# Patient Record
Sex: Male | Born: 1970 | Race: White | Hispanic: No | Marital: Married | Smoking: Former smoker
Health system: Southern US, Community
[De-identification: ages and names within clinical notes are randomized; demographics above are authoritative.]

## PROBLEM LIST (undated history)

## (undated) DIAGNOSIS — I10 Essential (primary) hypertension: Secondary | ICD-10-CM

## (undated) DIAGNOSIS — T8859XA Other complications of anesthesia, initial encounter: Secondary | ICD-10-CM

## (undated) DIAGNOSIS — K219 Gastro-esophageal reflux disease without esophagitis: Secondary | ICD-10-CM

## (undated) DIAGNOSIS — G4730 Sleep apnea, unspecified: Secondary | ICD-10-CM

## (undated) DIAGNOSIS — M1990 Unspecified osteoarthritis, unspecified site: Secondary | ICD-10-CM

## (undated) DIAGNOSIS — R51 Headache: Secondary | ICD-10-CM

## (undated) DIAGNOSIS — R112 Nausea with vomiting, unspecified: Secondary | ICD-10-CM

## (undated) DIAGNOSIS — R0689 Other abnormalities of breathing: Secondary | ICD-10-CM

## (undated) DIAGNOSIS — T4145XA Adverse effect of unspecified anesthetic, initial encounter: Secondary | ICD-10-CM

## (undated) DIAGNOSIS — M4316 Spondylolisthesis, lumbar region: Secondary | ICD-10-CM

## (undated) DIAGNOSIS — Z9889 Other specified postprocedural states: Secondary | ICD-10-CM

## (undated) HISTORY — PX: SPINE SURGERY: SHX786

## (undated) HISTORY — PX: VASECTOMY: SHX75

## (undated) HISTORY — PX: ANKLE FRACTURE SURGERY: SHX122

## (undated) HISTORY — PX: FRACTURE SURGERY: SHX138

## (undated) HISTORY — PX: CERVICAL FUSION: SHX112

## (undated) HISTORY — PX: KNEE LIGAMENT RECONSTRUCTION: SHX1895

## (undated) HISTORY — PX: BACK SURGERY: SHX140

---
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
## 2001-08-09 ENCOUNTER — Encounter: Payer: Self-pay | Admitting: Emergency Medicine

## 2001-08-09 ENCOUNTER — Emergency Department (HOSPITAL_COMMUNITY): Admission: EM | Admit: 2001-08-09 | Discharge: 2001-08-09 | Payer: Self-pay | Admitting: Emergency Medicine

## 2002-12-08 ENCOUNTER — Ambulatory Visit: Admission: RE | Admit: 2002-12-08 | Discharge: 2002-12-08 | Payer: Self-pay | Admitting: Family Medicine

## 2005-05-10 ENCOUNTER — Encounter: Admission: RE | Admit: 2005-05-10 | Discharge: 2005-05-10 | Payer: Self-pay | Admitting: Family Medicine

## 2005-06-06 ENCOUNTER — Ambulatory Visit (HOSPITAL_COMMUNITY): Admission: RE | Admit: 2005-06-06 | Discharge: 2005-06-06 | Payer: Self-pay | Admitting: Neurosurgery

## 2005-07-26 ENCOUNTER — Ambulatory Visit (HOSPITAL_COMMUNITY): Admission: RE | Admit: 2005-07-26 | Discharge: 2005-07-27 | Payer: Self-pay | Admitting: Neurosurgery

## 2007-08-02 ENCOUNTER — Encounter: Admission: RE | Admit: 2007-08-02 | Discharge: 2007-08-02 | Payer: Self-pay | Admitting: Family Medicine

## 2010-07-29 NOTE — Discharge Summary (Signed)
NAME:  Troy Franklin, Troy Franklin                ACCOUNT NO.:  [id number]   MEDICAL RECORD NO.:  [id number]          PATIENT TYPE:  OIB   LOCATION:  3007                         FACILITY:  [facility]   PHYSICIAN:  Coletta Memos, M.D.     DATE OF BIRTH:  1970-12-07   DATE OF ADMISSION:  07/26/2005  DATE OF DISCHARGE:  07/27/2005                                 DISCHARGE SUMMARY   ADMITTING DIAGNOSIS:  1.  Displaced disk C5-C6.  2.  C6 radiculopathy.   DISCHARGE DIAGNOSIS:  1.  Displaced disk C5-C6.  2.  C6 radiculopathy.   PROCEDURE:  Anterior cervical decompression C5-C6, arthrodesis C5-C6 with  PEEK interbody implant 7 mm Synthes ACS implant, anterior instrumentation  Synthes ACCS plate.   COMPLICATIONS:  None.   SURGEON:  Coletta Memos, M.D.   DISCHARGE STATUS:  Alive and well.   DISCHARGE DESTINATION:  Home.   MEDICATIONS:  Vicodin and Flexeril.   HOSPITAL COURSE:  Troy Franklin is a 40 year old who had pain in the upper  extremities which was unrelenting.  Time and conservative measures did not  help.  He therefore agreed to an ACDF.  He was admitted and taken to the  operating room for said procedure.  He had no complications.  Postoperatively, he has done quite well.  Speaking voice is strong.  Wound  is clean, dry, and without signs of infection at discharge.  He has been  able to void and walk, also tolerating a regular diet at discharge.  I will  see him back in the office in approximately 3-4 weeks.           ______________________________  Coletta Memos, M.D.     KC/MEDQ  D:  07/27/2005  T:  07/27/2005  Job:  [id number]

## 2010-07-29 NOTE — Op Note (Signed)
NAME:  Troy Franklin, Troy Franklin                ACCOUNT NO.:  [id number]   MEDICAL RECORD NO.:  [id number]            PATIENT TYPE:   LOCATION:                                 FACILITY:   PHYSICIAN:  Coletta Memos, M.D.          DATE OF BIRTH:   DATE OF PROCEDURE:  07/26/2005  DATE OF DISCHARGE:                                 OPERATIVE REPORT   PREOPERATIVE DIAGNOSIS:  1.  Displaced disk, right, C5-C6.  2.  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  3.  Right C6 radiculopathy.   POSTOPERATIVE DIAGNOSIS:  1.  Displaced disk, right, C5-C6.  2.  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  3.  Right C6 radiculopathy.   PROCEDURE:  1.  Anterior cervical decompression C5-6.  2.  Arthrodesis C5-6 with interbody 7-mm PEEK Synthes ACS graft, filled with      morselized allograft DBX bone putty.  3.  Anterior plating 14-mm ACCS Synthes plate.   COMPLICATIONS:  None.   SURGEON:  Coletta Memos, M.D.   ASSISTANT:  Danae Orleans. Venetia Maxon, M.D.   INDICATIONS:  Troy Franklin is a 40 year old who has had pain in his neck  and right upper extremity since January.  He has numbness and tingling in  the upper extremities.  He has pain in that side.  A myelogram showed a  displaced disk and foraminal narrowing.  I recommended, and he agreed to  undergo operative decompression.   OPERATIVE NOTE:  [name] was brought to the operating room, intubated and  placed under general anesthetic without difficulty.  He was positioned with  his head in neutral on a horseshoe headrest.  His neck was prepped.  He was  draped in a sterile fashion.  I infiltrated 5 mL of 1/2% lidocaine 1:200,000  strength epinephrine into the cervical region.  I started at the midline,  extended to the medial border of the left sternocleidomastoid at a crease  which was just at the cricothyroid membrane.  I opened the skin with a #15  blade; and I took this down sharply through the platysma.  I then dissected  rostrally and caudally in a plane above the platysma, using  Metzenbaum  scissors.  I opened the platysma in a horizontal fashion.  I dissected  inferior to the platysma rostrally and caudally.  I then created an  avascular plane going through the space between the sternocleidomastoid and  the medial strap muscles.  I placed a spinal needle into the cervical spine  and that was a 6-7.  I took another x-ray and placed a spinal needle one  disk space above, and that was at C5-6.  There was a large anterior  osteophyte at that level.  I then took down the osteophyte using a Leksell  rongeur.  I then reflected the longus colli muscles bilaterally at the C5-6  level.  I placed self-retaining retractor and then placed distraction pins.   I commenced with the decompression, first by opening the disk space and  removing the disk using curettes, pituitary rongeurs, and Kerrison punches.  I then used  a high-speed drill to drill away what were fairly large  osteophytes posteriorly coming off the body of the C6 and C5.  I did this  until I had good decompression bony wise.  I then opened the posterior  longitudinal ligament using a 1 Kerrison punch.  I proceeded with larger  punches in progressive fashion until I fully decompressed the spinal canal  and both C6 nerve roots.  I was more aggressive on the right sided, as that  is where he had the greatest amount of compression.  Dr. Venetia Maxon assisted with  the decompression.   I then prepared the endplates for arthrodesis, first straightening out the  vertebral bodies to accept the graft; and then making sure that all of the  disk material was gone.  I then placed a 7 mm PEEK ACS interbody graft over  DBX bon putty.  I removed the distraction,  and then the distraction pins.   I then placed a 14-mm plate, first by drilling; then using self-tapping  screws.  Four screws were placed, 2 at C5, 2 at C6.  This was done, again,  with the assistance of Dr. Venetia Maxon.  I then irrigated the wound copiously.  An  x-ray was  taken and it showed the plate and interbody to be in good  position.  I then closed the wound in a layered fashion using Vicryl sutures  to reapproximate the platysma and subcuticular layers.  Dermabond was used  for a sterile dressing.           ______________________________  Coletta Memos, M.D.     KC/MEDQ  D:  07/26/2005  T:  07/27/2005  Job:  [id number]

## 2011-03-14 HISTORY — PX: CERVICAL FUSION: SHX112

## 2011-06-26 ENCOUNTER — Other Ambulatory Visit: Payer: Self-pay | Admitting: Neurosurgery

## 2011-06-26 DIAGNOSIS — M542 Cervicalgia: Secondary | ICD-10-CM

## 2011-06-28 ENCOUNTER — Ambulatory Visit
Admission: RE | Admit: 2011-06-28 | Discharge: 2011-06-28 | Disposition: A | Discharge status: Home / Self Care | Payer: BC Managed Care – PPO | Source: Ambulatory Visit | Attending: Neurosurgery | Admitting: Neurosurgery

## 2011-06-28 DIAGNOSIS — M542 Cervicalgia: Secondary | ICD-10-CM

## 2011-08-18 ENCOUNTER — Other Ambulatory Visit: Payer: Self-pay | Admitting: Neurosurgery

## 2011-08-18 DIAGNOSIS — M545 Low back pain: Secondary | ICD-10-CM

## 2011-08-24 ENCOUNTER — Ambulatory Visit
Admission: RE | Admit: 2011-08-24 | Discharge: 2011-08-24 | Disposition: A | Discharge status: Home / Self Care | Payer: BC Managed Care – PPO | Source: Ambulatory Visit | Attending: Neurosurgery | Admitting: Neurosurgery

## 2011-08-24 DIAGNOSIS — M545 Low back pain: Secondary | ICD-10-CM

## 2011-08-25 ENCOUNTER — Other Ambulatory Visit: Payer: BC Managed Care – PPO

## 2011-08-28 ENCOUNTER — Other Ambulatory Visit: Payer: BC Managed Care – PPO

## 2011-09-05 ENCOUNTER — Encounter (HOSPITAL_COMMUNITY): Payer: Self-pay | Admitting: Pharmacy Technician

## 2011-09-05 ENCOUNTER — Other Ambulatory Visit: Payer: Self-pay | Admitting: Neurosurgery

## 2011-09-07 ENCOUNTER — Encounter (HOSPITAL_COMMUNITY): Payer: Self-pay

## 2011-09-07 ENCOUNTER — Other Ambulatory Visit (HOSPITAL_COMMUNITY): Payer: BC Managed Care – PPO

## 2011-09-07 ENCOUNTER — Encounter (HOSPITAL_COMMUNITY)
Admission: RE | Admit: 2011-09-07 | Discharge: 2011-09-07 | Disposition: A | Discharge status: Home / Self Care | Payer: BC Managed Care – PPO | Source: Ambulatory Visit | Attending: Neurosurgery | Admitting: Neurosurgery

## 2011-09-07 DIAGNOSIS — Z01812 Encounter for preprocedural laboratory examination: Secondary | ICD-10-CM | POA: Insufficient documentation

## 2011-09-07 HISTORY — DX: Other complications of anesthesia, initial encounter: T88.59XA

## 2011-09-07 HISTORY — DX: Headache: R51

## 2011-09-07 HISTORY — DX: Nausea with vomiting, unspecified: Z98.890

## 2011-09-07 HISTORY — DX: Adverse effect of unspecified anesthetic, initial encounter: T41.45XA

## 2011-09-07 HISTORY — DX: Nausea with vomiting, unspecified: R11.2

## 2011-09-07 HISTORY — DX: Unspecified osteoarthritis, unspecified site: M19.90

## 2011-09-07 LAB — CBC
HCT: 42 % (ref 39.0–52.0)
Hemoglobin: 14.8 g/dL (ref 13.0–17.0)
MCH: 31.2 pg (ref 26.0–34.0)
MCHC: 35.2 g/dL (ref 30.0–36.0)
MCV: 88.4 fL (ref 78.0–100.0)
Platelets: 150 10*3/uL (ref 150–400)
RBC: 4.75 MIL/uL (ref 4.22–5.81)
RDW: 12.3 % (ref 11.5–15.5)
WBC: 8.7 10*3/uL (ref 4.0–10.5)

## 2011-09-07 LAB — TYPE AND SCREEN
ABO/RH(D): O POS
Antibody Screen: NEGATIVE

## 2011-09-07 LAB — SURGICAL PCR SCREEN
MRSA, PCR: NEGATIVE
Staphylococcus aureus: NEGATIVE

## 2011-09-07 LAB — ABO/RH: ABO/RH(D): O POS

## 2011-09-07 NOTE — Progress Notes (Signed)
Does not have primary physician or cardiologist. Has not had cardiac work-up

## 2011-09-07 NOTE — Pre-Procedure Instructions (Signed)
20 Troy Franklin  09/07/2011   Your procedure is scheduled on:  July 3rd  Report to [facility] at 0600 AM.  Call this number if you have problems the morning of surgery: [phone]   Remember:   Do not eat food or drink:After Midnight.  Take these medicines the morning of surgery with A SIP OF WATER: none   Do not wear jewelry.  Do not wear lotions, powders, or perfumes.   Do not shave 48 hours prior to surgery. Men may shave face and neck.  Do not bring valuables to the hospital.  Contacts, dentures or bridgework may not be worn into surgery.  Leave suitcase in the car. After surgery it may be brought to your room.  For patients admitted to the hospital, checkout time is 11:00 AM the day of discharge.   Patients discharged the day of surgery will not be allowed to drive home.  Special Instructions: CHG Shower Use Special Wash: 1/2 bottle night before surgery and 1/2 bottle morning of surgery.   Please read over the following fact sheets that you were given: Pain Booklet, Coughing and Deep Breathing, Blood Transfusion Information, MRSA Information and Surgical Site Infection Prevention

## 2011-09-13 ENCOUNTER — Ambulatory Visit (HOSPITAL_COMMUNITY): Admission: RE | Admit: 2011-09-13 | Payer: BC Managed Care – PPO | Source: Ambulatory Visit | Admitting: Neurosurgery

## 2011-09-13 ENCOUNTER — Encounter (HOSPITAL_COMMUNITY): Admission: RE | Payer: Self-pay | Source: Ambulatory Visit

## 2011-09-13 SURGERY — POSTERIOR LUMBAR FUSION 1 LEVEL
Anesthesia: General

## 2012-02-27 ENCOUNTER — Other Ambulatory Visit: Payer: Self-pay | Admitting: Neurosurgery

## 2012-02-28 ENCOUNTER — Encounter (HOSPITAL_COMMUNITY): Payer: Self-pay

## 2012-02-28 ENCOUNTER — Encounter (HOSPITAL_COMMUNITY): Payer: Self-pay | Admitting: Respiratory Therapy

## 2012-02-28 ENCOUNTER — Encounter (HOSPITAL_COMMUNITY)
Admission: RE | Admit: 2012-02-28 | Discharge: 2012-02-28 | Disposition: A | Discharge status: Home / Self Care | Payer: BC Managed Care – PPO | Source: Ambulatory Visit | Attending: Neurosurgery | Admitting: Neurosurgery

## 2012-02-28 DIAGNOSIS — M129 Arthropathy, unspecified: Secondary | ICD-10-CM | POA: Insufficient documentation

## 2012-02-28 DIAGNOSIS — Z01818 Encounter for other preprocedural examination: Secondary | ICD-10-CM | POA: Insufficient documentation

## 2012-02-28 DIAGNOSIS — R51 Headache: Secondary | ICD-10-CM | POA: Insufficient documentation

## 2012-02-28 HISTORY — DX: Other abnormalities of breathing: R06.89

## 2012-02-28 LAB — CBC
HCT: 43 % (ref 39.0–52.0)
Hemoglobin: 15.2 g/dL (ref 13.0–17.0)
MCH: 31.2 pg (ref 26.0–34.0)
MCHC: 35.3 g/dL (ref 30.0–36.0)
MCV: 88.3 fL (ref 78.0–100.0)
Platelets: 159 10*3/uL (ref 150–400)
RBC: 4.87 MIL/uL (ref 4.22–5.81)
RDW: 11.9 % (ref 11.5–15.5)
WBC: 9 10*3/uL (ref 4.0–10.5)

## 2012-02-28 LAB — BASIC METABOLIC PANEL
BUN: 16 mg/dL (ref 6–23)
CO2: 26 mEq/L (ref 19–32)
Calcium: 9.8 mg/dL (ref 8.4–10.5)
Chloride: 104 mEq/L (ref 96–112)
Creatinine, Ser: 1 mg/dL (ref 0.50–1.35)
GFR calc Af Amer: >90 mL/min (ref >90)
GFR calc non Af Amer: >90 mL/min (ref >90)
Glucose, Bld: 84 mg/dL (ref 70–99)
Potassium: 4 mEq/L (ref 3.5–5.1)
Sodium: 139 mEq/L (ref 135–145)

## 2012-02-28 LAB — SURGICAL PCR SCREEN
MRSA, PCR: NEGATIVE
Staphylococcus aureus: NEGATIVE

## 2012-02-28 LAB — TYPE AND SCREEN
ABO/RH(D): O POS
Antibody Screen: NEGATIVE

## 2012-02-28 NOTE — Progress Notes (Signed)
Pt. Reports that he had ekg in past 4 yrs- at [facility]- wnl.

## 2012-02-28 NOTE — Pre-Procedure Instructions (Signed)
20 BARNETT ELZEY  02/28/2012   Your procedure is scheduled on:  03/07/2012  Report to [facility] at  5:30 AM.  Call this number if you have problems the morning of surgery: [phone]   Remember:   Do not eat food or drink liquid:After Midnight.- WEDNESDAY     Take these medicines the morning of surgery with A SIP OF WATER: nothing   Do not wear jewelry  Do not wear lotions, powders, or perfumes. You may wear deodorant.  . Men may shave face and neck.  Do not bring valuables to the hospital.  Contacts, dentures or bridgework may not be worn into surgery.  Leave suitcase in the car. After surgery it may be brought to your room.  For patients admitted to the hospital, checkout time is 11:00 AM the day of discharge.   Patients discharged the day of surgery will not be allowed to drive home.  Name and phone number of your driver: With Son  Special Instructions: Shower using CHG 2 nights before surgery and the night before surgery.  If you shower the day of surgery use CHG.  Use special wash - you have one bottle of CHG for all showers.  You should use approximately 1/3 of the bottle for each shower.   Please read over the following fact sheets that you were given: Pain Booklet, Coughing and Deep Breathing, Blood Transfusion Information, MRSA Information and Surgical Site Infection Prevention

## 2012-03-07 ENCOUNTER — Ambulatory Visit (HOSPITAL_COMMUNITY): Admission: RE | Admit: 2012-03-07 | Payer: BC Managed Care – PPO | Source: Ambulatory Visit | Admitting: Neurosurgery

## 2012-03-07 ENCOUNTER — Encounter (HOSPITAL_COMMUNITY): Admission: RE | Payer: Self-pay | Source: Ambulatory Visit

## 2012-03-07 SURGERY — POSTERIOR LUMBAR FUSION 1 LEVEL
Anesthesia: General

## 2012-04-03 ENCOUNTER — Other Ambulatory Visit: Payer: Self-pay | Admitting: Neurosurgery

## 2012-04-03 ENCOUNTER — Encounter (HOSPITAL_COMMUNITY): Payer: Self-pay | Admitting: Pharmacy Technician

## 2012-04-09 ENCOUNTER — Encounter (HOSPITAL_COMMUNITY): Payer: Self-pay

## 2012-04-09 ENCOUNTER — Encounter (HOSPITAL_COMMUNITY)
Admission: RE | Admit: 2012-04-09 | Discharge: 2012-04-09 | Disposition: A | Discharge status: Home / Self Care | Payer: BC Managed Care – PPO | Source: Ambulatory Visit | Attending: Neurosurgery | Admitting: Neurosurgery

## 2012-04-09 LAB — BASIC METABOLIC PANEL
BUN: 12 mg/dL (ref 6–23)
CO2: 29 mEq/L (ref 19–32)
Calcium: 9.4 mg/dL (ref 8.4–10.5)
Chloride: 103 mEq/L (ref 96–112)
Creatinine, Ser: 0.94 mg/dL (ref 0.50–1.35)
GFR calc Af Amer: >90 mL/min (ref >90)
GFR calc non Af Amer: >90 mL/min (ref >90)
Glucose, Bld: 114 mg/dL — ABNORMAL HIGH (ref 70–99)
Potassium: 4.2 mEq/L (ref 3.5–5.1)
Sodium: 139 mEq/L (ref 135–145)

## 2012-04-09 LAB — CBC
HCT: 42.5 % (ref 39.0–52.0)
Hemoglobin: 15 g/dL (ref 13.0–17.0)
MCH: 31.6 pg (ref 26.0–34.0)
MCHC: 35.3 g/dL (ref 30.0–36.0)
MCV: 89.5 fL (ref 78.0–100.0)
Platelets: 141 10*3/uL — ABNORMAL LOW (ref 150–400)
RBC: 4.75 MIL/uL (ref 4.22–5.81)
RDW: 12.2 % (ref 11.5–15.5)
WBC: 8 10*3/uL (ref 4.0–10.5)

## 2012-04-09 LAB — SURGICAL PCR SCREEN
MRSA, PCR: NEGATIVE
Staphylococcus aureus: NEGATIVE

## 2012-04-09 LAB — TYPE AND SCREEN
ABO/RH(D): O POS
Antibody Screen: NEGATIVE

## 2012-04-09 NOTE — Pre-Procedure Instructions (Signed)
Troy Franklin  04/09/2012   Your procedure is scheduled on:  Friday, January 31  Report to [facility] at 0700 AM.  Call this number if you have problems the morning of surgery: [phone]   Remember:   Do not eat food or drink liquids after midnight.Thursday night   Take these medicines the morning of surgery with A SIP OF WATER: none   Do not wear jewelry, make-up or nail polish.  Do not wear lotions, powders, or perfumes. You may wear deodorant.  Do not shave 48 hours prior to surgery. Men may shave face and neck.  Do not bring valuables to the hospital.  Contacts, dentures or bridgework may not be worn into surgery.  Leave suitcase in the car. After surgery it may be brought to your room.  For patients admitted to the hospital, checkout time is 11:00 AM the day of  discharge.     Special Instructions: Shower using CHG 2 nights before surgery and the night before surgery.  If you shower the day of surgery use CHG.  Use special wash - you have one bottle of CHG for all showers.  You should use approximately 1/3 of the bottle for each shower.   Please read over the following fact sheets that you were given: Pain Booklet, Coughing and Deep Breathing, Blood Transfusion Information, MRSA Information and Surgical Site Infection Prevention

## 2012-04-11 MED ORDER — CEFAZOLIN SODIUM-DEXTROSE 2-3 GM-% IV SOLR
2.0000 g | INTRAVENOUS | Status: AC
  Administered 2012-04-12: 2 g via INTRAVENOUS
  Filled 2012-04-11: qty 50

## 2012-04-12 ENCOUNTER — Encounter (HOSPITAL_COMMUNITY)
Admission: RE | Disposition: A | Discharge status: Home / Self Care | Payer: Self-pay | Source: Ambulatory Visit | Attending: Neurosurgery

## 2012-04-12 ENCOUNTER — Inpatient Hospital Stay (HOSPITAL_COMMUNITY): Payer: BC Managed Care – PPO

## 2012-04-12 ENCOUNTER — Encounter (HOSPITAL_COMMUNITY): Payer: Self-pay | Admitting: Certified Registered"

## 2012-04-12 ENCOUNTER — Inpatient Hospital Stay (HOSPITAL_COMMUNITY)
Admission: RE | Admit: 2012-04-12 | Discharge: 2012-04-15 | DRG: 756 | Disposition: A | Discharge status: Home / Self Care | Payer: BC Managed Care – PPO | Source: Ambulatory Visit | Attending: Neurosurgery | Admitting: Neurosurgery

## 2012-04-12 ENCOUNTER — Encounter (HOSPITAL_COMMUNITY): Payer: Self-pay | Admitting: *Deleted

## 2012-04-12 ENCOUNTER — Inpatient Hospital Stay (HOSPITAL_COMMUNITY): Payer: BC Managed Care – PPO | Admitting: Certified Registered"

## 2012-04-12 DIAGNOSIS — K5900 Constipation, unspecified: Secondary | ICD-10-CM | POA: Diagnosis not present

## 2012-04-12 DIAGNOSIS — M4316 Spondylolisthesis, lumbar region: Secondary | ICD-10-CM | POA: Diagnosis present

## 2012-04-12 DIAGNOSIS — Q762 Congenital spondylolisthesis: Principal | ICD-10-CM

## 2012-04-12 DIAGNOSIS — F17200 Nicotine dependence, unspecified, uncomplicated: Secondary | ICD-10-CM | POA: Diagnosis present

## 2012-04-12 DIAGNOSIS — Z79899 Other long term (current) drug therapy: Secondary | ICD-10-CM

## 2012-04-12 DIAGNOSIS — Z01812 Encounter for preprocedural laboratory examination: Secondary | ICD-10-CM

## 2012-04-12 SURGERY — POSTERIOR LUMBAR FUSION 1 LEVEL
Anesthesia: General | Site: Spine Lumbar | Wound class: Clean

## 2012-04-12 MED ORDER — PHENYLEPHRINE HCL 10 MG/ML IJ SOLN
INTRAMUSCULAR | Status: DC | PRN
  Administered 2012-04-12: 40 ug via INTRAVENOUS
  Administered 2012-04-12 (×4): 80 ug via INTRAVENOUS
  Administered 2012-04-12: 40 ug via INTRAVENOUS

## 2012-04-12 MED ORDER — OXYCODONE HCL 5 MG PO TABS
5.0000 mg | ORAL_TABLET | ORAL | Status: DC | PRN
  Administered 2012-04-12 – 2012-04-15 (×10): 10 mg via ORAL
  Filled 2012-04-12 (×10): qty 2

## 2012-04-12 MED ORDER — CEFAZOLIN SODIUM-DEXTROSE 2-3 GM-% IV SOLR: 2.0000 g | INTRAVENOUS | Status: DC

## 2012-04-12 MED ORDER — PHENOL 1.4 % MT LIQD: 1.0000 | OROMUCOSAL | Status: DC | PRN

## 2012-04-12 MED ORDER — NEOSTIGMINE METHYLSULFATE 1 MG/ML IJ SOLN
INTRAMUSCULAR | Status: DC | PRN
  Administered 2012-04-12: 3 mg via INTRAVENOUS

## 2012-04-12 MED ORDER — PROPOFOL 10 MG/ML IV BOLUS
INTRAVENOUS | Status: DC | PRN
  Administered 2012-04-12: 50 mg via INTRAVENOUS
  Administered 2012-04-12: 170 mg via INTRAVENOUS

## 2012-04-12 MED ORDER — SODIUM CHLORIDE 0.9 % IJ SOLN: 3.0000 mL | INTRAMUSCULAR | Status: DC | PRN

## 2012-04-12 MED ORDER — ONDANSETRON HCL 4 MG/2ML IJ SOLN: 4.0000 mg | INTRAMUSCULAR | Status: DC | PRN

## 2012-04-12 MED ORDER — ACETAMINOPHEN 650 MG RE SUPP: 650.0000 mg | RECTAL | Status: DC | PRN

## 2012-04-12 MED ORDER — MORPHINE SULFATE 2 MG/ML IJ SOLN: 2.0000 mg | INTRAMUSCULAR | Status: DC | PRN

## 2012-04-12 MED ORDER — ACETAMINOPHEN 325 MG PO TABS: 650.0000 mg | ORAL_TABLET | ORAL | Status: DC | PRN

## 2012-04-12 MED ORDER — VECURONIUM BROMIDE 10 MG IV SOLR
INTRAVENOUS | Status: DC | PRN
  Administered 2012-04-12: 1 mg via INTRAVENOUS
  Administered 2012-04-12 (×4): 2 mg via INTRAVENOUS
  Administered 2012-04-12: 1 mg via INTRAVENOUS

## 2012-04-12 MED ORDER — ONDANSETRON HCL 4 MG/2ML IJ SOLN
INTRAMUSCULAR | Status: DC | PRN
  Administered 2012-04-12: 4 mg via INTRAVENOUS

## 2012-04-12 MED ORDER — DIAZEPAM 5 MG PO TABS
5.0000 mg | ORAL_TABLET | Freq: Four times a day (QID) | ORAL | Status: DC | PRN
  Administered 2012-04-12 – 2012-04-14 (×2): 5 mg via ORAL
  Filled 2012-04-12 (×2): qty 1

## 2012-04-12 MED ORDER — MIDAZOLAM HCL 5 MG/5ML IJ SOLN
INTRAMUSCULAR | Status: DC | PRN
  Administered 2012-04-12: 2 mg via INTRAVENOUS

## 2012-04-12 MED ORDER — ROCURONIUM BROMIDE 100 MG/10ML IV SOLN
INTRAVENOUS | Status: DC | PRN
  Administered 2012-04-12: 50 mg via INTRAVENOUS

## 2012-04-12 MED ORDER — ACETAMINOPHEN 10 MG/ML IV SOLN
1000.0000 mg | Freq: Four times a day (QID) | INTRAVENOUS | Status: AC
  Administered 2012-04-12 – 2012-04-13 (×4): 1000 mg via INTRAVENOUS
  Filled 2012-04-12 (×4): qty 100

## 2012-04-12 MED ORDER — LIDOCAINE HCL (CARDIAC) 20 MG/ML IV SOLN
INTRAVENOUS | Status: DC | PRN
  Administered 2012-04-12: 80 mg via INTRAVENOUS

## 2012-04-12 MED ORDER — FENTANYL CITRATE 0.05 MG/ML IJ SOLN
25.0000 ug | INTRAMUSCULAR | Status: DC | PRN
  Administered 2012-04-12: 50 ug via INTRAVENOUS

## 2012-04-12 MED ORDER — GLYCOPYRROLATE 0.2 MG/ML IJ SOLN
INTRAMUSCULAR | Status: DC | PRN
  Administered 2012-04-12: 0.4 mg via INTRAVENOUS

## 2012-04-12 MED ORDER — DEXAMETHASONE SODIUM PHOSPHATE 4 MG/ML IJ SOLN
INTRAMUSCULAR | Status: DC | PRN
  Administered 2012-04-12: 8 mg via INTRAVENOUS

## 2012-04-12 MED ORDER — SODIUM CHLORIDE 0.9 % IV SOLN: 250.0000 mL | INTRAVENOUS | Status: DC

## 2012-04-12 MED ORDER — SENNA 8.6 MG PO TABS
1.0000 | ORAL_TABLET | Freq: Two times a day (BID) | ORAL | Status: DC
  Administered 2012-04-12 – 2012-04-15 (×6): 8.6 mg via ORAL
  Filled 2012-04-12 (×8): qty 1

## 2012-04-12 MED ORDER — POTASSIUM CHLORIDE IN NACL 20-0.9 MEQ/L-% IV SOLN
INTRAVENOUS | Status: DC
  Administered 2012-04-12: 19:00:00 via INTRAVENOUS
  Filled 2012-04-12 (×7): qty 1000

## 2012-04-12 MED ORDER — MENTHOL 3 MG MT LOZG: 1.0000 | LOZENGE | OROMUCOSAL | Status: DC | PRN

## 2012-04-12 MED ORDER — ALBUMIN HUMAN 5 % IV SOLN
INTRAVENOUS | Status: DC | PRN
  Administered 2012-04-12: 12:00:00 via INTRAVENOUS

## 2012-04-12 MED ORDER — 0.9 % SODIUM CHLORIDE (POUR BTL) OPTIME
TOPICAL | Status: DC | PRN
  Administered 2012-04-12 (×2): 1000 mL

## 2012-04-12 MED ORDER — EPHEDRINE SULFATE 50 MG/ML IJ SOLN
INTRAMUSCULAR | Status: DC | PRN
  Administered 2012-04-12 (×2): 10 mg via INTRAVENOUS

## 2012-04-12 MED ORDER — FENTANYL CITRATE 0.05 MG/ML IJ SOLN
INTRAMUSCULAR | Status: DC | PRN
  Administered 2012-04-12 (×4): 50 ug via INTRAVENOUS
  Administered 2012-04-12: 100 ug via INTRAVENOUS

## 2012-04-12 MED ORDER — POLYETHYLENE GLYCOL 3350 17 G PO PACK
17.0000 g | PACK | Freq: Every day | ORAL | Status: DC | PRN
  Filled 2012-04-12: qty 1

## 2012-04-12 MED ORDER — LIDOCAINE-EPINEPHRINE 0.5 %-1:200000 IJ SOLN
INTRAMUSCULAR | Status: DC | PRN
  Administered 2012-04-12 (×2): 20 mL

## 2012-04-12 MED ORDER — LACTATED RINGERS IV SOLN
INTRAVENOUS | Status: DC | PRN
  Administered 2012-04-12 (×3): via INTRAVENOUS

## 2012-04-12 MED ORDER — SODIUM CHLORIDE 0.9 % IJ SOLN
3.0000 mL | Freq: Two times a day (BID) | INTRAMUSCULAR | Status: DC
  Administered 2012-04-12 – 2012-04-14 (×4): 3 mL via INTRAVENOUS

## 2012-04-12 MED ORDER — THROMBIN 20000 UNITS EX SOLR
CUTANEOUS | Status: DC | PRN
  Administered 2012-04-12: 12:00:00 via TOPICAL

## 2012-04-12 MED ORDER — FENTANYL CITRATE 0.05 MG/ML IJ SOLN
INTRAMUSCULAR | Status: AC
  Administered 2012-04-12: 50 ug via INTRAVENOUS
  Filled 2012-04-12: qty 2

## 2012-04-12 MED ORDER — ACETAMINOPHEN 325 MG PO TABS
650.0000 mg | ORAL_TABLET | ORAL | Status: DC | PRN
  Administered 2012-04-14: 650 mg via ORAL
  Filled 2012-04-12 (×2): qty 2

## 2012-04-12 SURGICAL SUPPLY — 70 items
5.5x35 Shanks ×4 IMPLANT
ADH SKN CLS APL DERMABOND .7 (GAUZE/BANDAGES/DRESSINGS)
APL SKNCLS STERI-STRIP NONHPOA (GAUZE/BANDAGES/DRESSINGS)
BENZOIN TINCTURE PRP APPL 2/3 (GAUZE/BANDAGES/DRESSINGS) IMPLANT
BLADE SURG ROTATE 9660 (MISCELLANEOUS) IMPLANT
BONE MATRIX OSTEOCEL PLUS 5CC (Bone Implant) ×2 IMPLANT
BUR MATCHSTICK NEURO 3.0 LAGG (BURR) ×2 IMPLANT
CAGE COROENT MP 11X23X9 (Cage) ×2 IMPLANT
CANISTER SUCTION 2500CC (MISCELLANEOUS) ×2 IMPLANT
CLOTH BEACON ORANGE TIMEOUT ST (SAFETY) ×2 IMPLANT
CONT SPEC 4OZ CLIKSEAL STRL BL (MISCELLANEOUS) ×2 IMPLANT
COVER BACK TABLE 24X17X13 BIG (DRAPES) IMPLANT
DECANTER SPIKE VIAL GLASS SM (MISCELLANEOUS) ×2 IMPLANT
DERMABOND ADVANCED (GAUZE/BANDAGES/DRESSINGS)
DERMABOND ADVANCED .7 DNX12 (GAUZE/BANDAGES/DRESSINGS) ×1 IMPLANT
DRAPE C-ARM 42X72 X-RAY (DRAPES) ×4 IMPLANT
DRAPE C-ARMOR (DRAPES) ×1 IMPLANT
DRAPE INCISE IOBAN 66X45 STRL (DRAPES) ×1 IMPLANT
DRAPE LAPAROTOMY 100X72X124 (DRAPES) ×2 IMPLANT
DRAPE POUCH INSTRU U-SHP 10X18 (DRAPES) ×2 IMPLANT
DRAPE SURG 17X23 STRL (DRAPES) ×2 IMPLANT
DRESSING TELFA 8X3 (GAUZE/BANDAGES/DRESSINGS) IMPLANT
DURAPREP 26ML APPLICATOR (WOUND CARE) ×2 IMPLANT
ELECT REM PT RETURN 9FT ADLT (ELECTROSURGICAL) ×2
ELECTRODE REM PT RTRN 9FT ADLT (ELECTROSURGICAL) ×1 IMPLANT
GAUZE SPONGE 4X4 16PLY XRAY LF (GAUZE/BANDAGES/DRESSINGS) IMPLANT
GLOVE BIOGEL PI IND STRL 7.5 (GLOVE) IMPLANT
GLOVE BIOGEL PI IND STRL 8.5 (GLOVE) IMPLANT
GLOVE BIOGEL PI INDICATOR 7.5 (GLOVE) ×2
GLOVE BIOGEL PI INDICATOR 8.5 (GLOVE) ×2
GLOVE ECLIPSE 6.5 STRL STRAW (GLOVE) ×4 IMPLANT
GLOVE ECLIPSE 7.5 STRL STRAW (GLOVE) ×3 IMPLANT
GLOVE ECLIPSE 8.5 STRL (GLOVE) ×1 IMPLANT
GLOVE EXAM NITRILE LRG STRL (GLOVE) IMPLANT
GLOVE EXAM NITRILE MD LF STRL (GLOVE) IMPLANT
GLOVE EXAM NITRILE XL STR (GLOVE) IMPLANT
GLOVE EXAM NITRILE XS STR PU (GLOVE) IMPLANT
GLOVE SURG SS PI 8.0 STRL IVOR (GLOVE) ×3 IMPLANT
GOWN BRE IMP SLV AUR LG STRL (GOWN DISPOSABLE) ×5 IMPLANT
GOWN BRE IMP SLV AUR XL STRL (GOWN DISPOSABLE) ×2 IMPLANT
GOWN STRL REIN 2XL LVL4 (GOWN DISPOSABLE) ×3 IMPLANT
KIT BASIN OR (CUSTOM PROCEDURE TRAY) ×2 IMPLANT
KIT POSITION SURG JACKSON T1 (MISCELLANEOUS) ×2 IMPLANT
KIT ROOM TURNOVER OR (KITS) ×2 IMPLANT
NDL HYPO 25X1 1.5 SAFETY (NEEDLE) ×1 IMPLANT
NDL SPNL 18GX3.5 QUINCKE PK (NEEDLE) IMPLANT
NEEDLE HYPO 25X1 1.5 SAFETY (NEEDLE) ×2 IMPLANT
NEEDLE SPNL 18GX3.5 QUINCKE PK (NEEDLE) ×2 IMPLANT
NS IRRIG 1000ML POUR BTL (IV SOLUTION) ×3 IMPLANT
PACK LAMINECTOMY NEURO (CUSTOM PROCEDURE TRAY) ×2 IMPLANT
PAD ARMBOARD 7.5X6 YLW CONV (MISCELLANEOUS) ×3 IMPLANT
ROD 5.5X40MM (Rod) ×2 IMPLANT
SCREW LOCK (Screw) ×8 IMPLANT
SCREW LOCK FXNS SPNE MAS PL (Screw) IMPLANT
SCREW TULIP 5.5 (Screw) ×4 IMPLANT
SLEEVE SURGEON STRL (DRAPES) ×1 IMPLANT
SPONGE GAUZE 4X4 12PLY (GAUZE/BANDAGES/DRESSINGS) IMPLANT
SPONGE LAP 4X18 X RAY DECT (DISPOSABLE) IMPLANT
SPONGE SURGIFOAM ABS GEL 100 (HEMOSTASIS) ×2 IMPLANT
STRIP CLOSURE SKIN 1/2X4 (GAUZE/BANDAGES/DRESSINGS) IMPLANT
SUT PROLENE 6 0 BV (SUTURE) IMPLANT
SUT VIC AB 0 CT1 18XCR BRD8 (SUTURE) ×1 IMPLANT
SUT VIC AB 0 CT1 8-18 (SUTURE) ×4
SUT VIC AB 2-0 CT1 18 (SUTURE) ×2 IMPLANT
SUT VIC AB 3-0 SH 8-18 (SUTURE) ×3 IMPLANT
SYR 20ML ECCENTRIC (SYRINGE) ×2 IMPLANT
TOWEL OR 17X24 6PK STRL BLUE (TOWEL DISPOSABLE) ×2 IMPLANT
TOWEL OR 17X26 10 PK STRL BLUE (TOWEL DISPOSABLE) ×2 IMPLANT
TRAY FOLEY CATH 14FRSI W/METER (CATHETERS) ×2 IMPLANT
WATER STERILE IRR 1000ML POUR (IV SOLUTION) ×2 IMPLANT

## 2012-04-12 NOTE — Preoperative (Signed)
Beta Blockers   Reason not to administer Beta Blockers:Not Applicable 

## 2012-04-12 NOTE — Anesthesia Preprocedure Evaluation (Addendum)
Anesthesia Evaluation  Patient identified by MRN, date of birth, ID band Patient awake    Reviewed: Allergy & Precautions, H&P , NPO status , Patient's Chart, lab work & pertinent test results  History of Anesthesia Complications (+) PONV  Airway Mallampati: II      Dental   Pulmonary neg pulmonary ROS,  breath sounds clear to auscultation        Cardiovascular negative cardio ROS  Rhythm:Regular Rate:Normal     Neuro/Psych  Headaches,    GI/Hepatic negative GI ROS, Neg liver ROS,   Endo/Other  negative endocrine ROS  Renal/GU negative Renal ROS     Musculoskeletal   Abdominal   Peds  Hematology   Anesthesia Other Findings   Reproductive/Obstetrics                           Anesthesia Physical Anesthesia Plan  ASA: II  Anesthesia Plan: General   Post-op Pain Management:    Induction: Intravenous  Airway Management Planned: Oral ETT  Additional Equipment:   Intra-op Plan:   Post-operative Plan: Extubation in OR  Informed Consent: I have reviewed the patients History and Physical, chart, labs and discussed the procedure including the risks, benefits and alternatives for the proposed anesthesia with the patient or authorized representative who has indicated his/her understanding and acceptance.   Dental advisory given  Plan Discussed with: CRNA, Anesthesiologist and Surgeon  Anesthesia Plan Comments:         Anesthesia Quick Evaluation

## 2012-04-12 NOTE — Transfer of Care (Signed)
Immediate Anesthesia Transfer of Care Note  Patient: Troy Franklin  Procedure(s) Performed: Procedure(s) (LRB) with comments: POSTERIOR LUMBAR FUSION 1 LEVEL (N/A) - Lumbar four-five posterior lumbar interbody fusion with interbody prothesis posterolateral arthrodesis and posterior nonsegmental instrumentation  Patient Location: PACU  Anesthesia Type:General  Level of Consciousness: awake, alert , oriented and patient cooperative  Airway & Oxygen Therapy: Patient Spontanous Breathing and Patient connected to face mask oxygen  Post-op Assessment: Report given to PACU RN, Post -op Vital signs reviewed and stable and Patient moving all extremities  Post vital signs: Reviewed and stable  Complications: No apparent anesthesia complications

## 2012-04-12 NOTE — Progress Notes (Signed)
Patient ID: Troy Franklin, male   DOB: 16-Nov-1970, 42 y.o.   MRN: [id number] BP 125/74  Pulse 82  Temp 97.8 F (36.6 C) (Oral)  Resp 16  SpO2 99% Alert and oriented x 4 Moving all extremities well Wound is clean and dry Doing well post op

## 2012-04-12 NOTE — Op Note (Signed)
04/12/2012  4:46 PM  PATIENT:  Troy Franklin  42 y.o. 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 Woolbright was taken to the operating room intubated and placed under a general anesthetic without difficulty. He had a foley catheter placed under sterile conditions. He was positioned prone on a Jackson table with all pressure points properly padded. His back was prepped and drape in sterile fashion. I infiltrated 20 cc into the planned incision and paraspinous region. I opened the skin with a 10 blade. I used monopolar cautery to expose the lamina of L3, L4 And L5. I exposed the pars of L4 bilaterally and L5 bilaterally.  Now having exposed my landmarks I proceeded with screw placement using fluoroscopic guidance. The screws were placed in a medial to lateral direction using a cortical based entry inferior to the facet and medial to the pars at each level and side. The pedicles were were drilled, tapped, and sounded to ensure their integrity. I placed 5.5 mm screws all of them were 30mm in length. Fluoroscopy showed all the screws to be in good position. I then decompressed the spinal canal and the L4, L5 roots bilaterally. I performed a complete laminectomyn of L4 to decompress the canal. I  peformed inferior facetectomies of L4 bilaterally to make sure the L4 roots were decompressed. The facets were significantly arthropathic and a great deal of bone removal was performed to decompress the l4 and l5 roots. I also performed discetomies bilaterally to prepare for the PLIF. I used curettes, disc space shavers, and Kerrison punches. After the discetomy I measured the space and felt 11mm 4 degree cages were appropiate. I placed 2 cages packed with morselized allograft and autograft into the L4/5 disc space. Fluoroscopy showed the screws and cages in good position. I placed more bone into the disc space.  I connected the screw heads to the screws, then the rods. I secured the rods with locking caps on both sides. I irrigated copiously.  I closed the wound in layered fashion approximating the thoracolumbar fascia, subcutaneous, and subcuticular layers with vicryl sutures. I used dermabond for a sterile dressing.   PLAN OF CARE: Admit to inpatient   PATIENT DISPOSITION:  PACU - hemodynamically stable.   Delay start of Pharmacological VTE agent (>24hrs) due to surgical blood loss or risk of bleeding:  yes

## 2012-04-12 NOTE — H&P (Signed)
BP 119/81  Pulse 78  Temp 98.2 F (36.8 C) (Oral)  Resp 18  SpO2 97% Troy Franklin is a patient of mine whom I took to the operating room in 2007 for a herniated disc at C5-6.  He returns today and is age 42 with complaints of pain in his low back, which has been present for approximately a year.  He feels it mainly in his buttocks. He says if he moves a certain way he will have a sharp pain again in the buttocks, and maybe it will radiate to the knees. What is more impressive for him and more troublesome has been neck pain and in the right upper extremity.  He says 50% of the time it feels numb and tingly.  No discomfort whatsoever in the left upper extremity. He will find himself putting his right arm behind his head.  He has been doing this for six to eight months, but for the last two months it has become excruciating.  He finds driving and shaving quite tough to do.  He takes an occasional Advil 800 mg. b.i.d. for that pain.    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  He does smoke. He does drink alcohol socially. He does have a history of substance abuse.  He is 5'8" tall and weighs 190 lbs.    PAST MEDICAL HISTORY:  Otherwise good. He has had three operations, cervical fusion, knee operation and a broken leg.  He has an ALLERGY TO CODEINE.  He takes Advil only.    FAMILY HISTORY:    His mother 3 is in poor health and has diabetes and breast cancer.  His father 37 is in good health and has hypertension.    REVIEW OF SYSTEMS:   He reports leg pain with walking, arm weakness, leg weakness, back pain, arm pain, leg pain, neck pain, and food allergies.  He denies constitutional, eye, ear, nose, throat, mouth, respiratory, gastrointestinal, genitourinary, skin, neurological, psychiatric, endocrine, or hematologic problems.    PHYSICAL EXAMINATION:  On examination, he is alert, oriented x 4, and answering all questions appropriately.  Memory, language, attention span,  and fund of knowledge are normal. He is well kempt and in no distress.  5/5 strength in the upper extremities.  2+ reflexes in the biceps, triceps, brachioradialis, knees, and ankles.  Downgoing toes to plantar stimulation.  Proprioception intact in the upper and lower extremities.  No clonus.  No Hoffmann's sign.  Normal muscle tone, bulk, and coordination. Gait is normal.  Speech is clear and fluent.  RADIOLOGY He returned with an MRI of the lumbar spine.  What it shows is a significant amount of facet arthropathy at L4-5, a degenerative slip, foraminal narrowing, and compromise of both L4 roots, and generalized lumbar stenosis. The rest of his spine actually looks good. The cauda and the conus are normal.  Paraspinous soft tissues are normal.  The L4-5 level is the reason for his discomfort.  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 When he stands he has pain and when he sits he does not.Troy Franklin has been in physical therapy for his lumbar region. It has not resulted in any improvement in his status.  He still has a great deal of pain.  The resultant decision is that I still believe he needs to undergo a lumbar fusion at L4-5 for the listhesis and stenosis.   he has now had complete and full conservative treatment, anti-inflammatories, steroids,  and physical therapy without improvement.  Troy Franklin has decided to undergo a Lumbar fusion and decompression at L4/5 to relieve his neurogenic claudication. Risks including but not limited to include bleeding, infection, need for further surgery, weakness, bowel/bladder dysfunction, no improvement, worse pain, fusion failure, hardware failure, and others were discussed

## 2012-04-12 NOTE — Anesthesia Postprocedure Evaluation (Signed)
  Anesthesia Post-op Note  Patient: Troy Franklin  Procedure(s) Performed: Procedure(s) (LRB) with comments: POSTERIOR LUMBAR FUSION 1 LEVEL (N/A) - Lumbar four-five posterior lumbar interbody fusion with interbody prothesis posterolateral arthrodesis and posterior nonsegmental instrumentation  Patient Location: PACU  Anesthesia Type:General  Level of Consciousness: awake  Airway and Oxygen Therapy: Patient Spontanous Breathing  Post-op Pain: mild  Post-op Assessment: Post-op Vital signs reviewed  Post-op Vital Signs: Reviewed  Complications: No apparent anesthesia complications

## 2012-04-12 NOTE — Anesthesia Procedure Notes (Signed)
Procedure Name: Intubation Date/Time: 04/12/2012 11:10 AM Performed by: Jerilee Hoh Pre-anesthesia Checklist: Patient identified, Suction available, Patient being monitored and Emergency Drugs available Patient Re-evaluated:Patient Re-evaluated prior to inductionOxygen Delivery Method: Circle system utilized Preoxygenation: Pre-oxygenation with 100% oxygen Intubation Type: IV induction Ventilation: Mask ventilation without difficulty and Oral airway inserted - appropriate to patient size Grade View: Grade III Tube type: Oral Tube size: 7.5 mm Number of attempts: 2 Airway Equipment and Method: Video-laryngoscopy and Rigid stylet Placement Confirmation: ETT inserted through vocal cords under direct vision,  positive ETCO2 and breath sounds checked- equal and bilateral Secured at: 22 cm Tube secured with: Tape Dental Injury: Teeth and Oropharynx as per pre-operative assessment  Comments: Easy mask airway.  DL with MAC 4 blade, Unable to view cords.  Glidescope used, Grade 1 with Glidescope.  Atraumatic intubation.  VSS throughout intervention.

## 2012-04-13 ENCOUNTER — Encounter (HOSPITAL_COMMUNITY): Payer: Self-pay | Admitting: *Deleted

## 2012-04-13 DIAGNOSIS — M4316 Spondylolisthesis, lumbar region: Secondary | ICD-10-CM | POA: Diagnosis present

## 2012-04-13 NOTE — Plan of Care (Signed)
Problem: Consults Goal: Diagnosis - Spinal Surgery Outcome: Completed/Met Date Met:  04/13/12 Thoraco/Lumbar Spine Fusion

## 2012-04-13 NOTE — Progress Notes (Signed)
Orthopedic Tech Progress Note Patient Details:  BRADLEE HEITMAN 1970/07/07 [id number]  Patient ID: Rande Brunt Kemmerer, male   DOB: [birth year], 42 y.o.   MRN: [id number] Brace order completed by Storm Frisk, Charlisha Market 04/13/2012, 3:32 PM

## 2012-04-13 NOTE — Evaluation (Signed)
Occupational Therapy Evaluation Patient Details Name: Troy Franklin MRN: [id number] DOB: [birth year] Today's Date: 04/13/2012 Time: 4098-1191 OT Time Calculation (min): 25 min  OT Assessment / Plan / Recommendation Clinical Impression  Pleasant 42 yr old male admitted for lumbar fusion.  Presents at a supervision to modified independent level for simulated selfcare tasks and functional transfers with use of the RW and back brace.  Pt will have assist at home from his girlfriend as well.  No further acute care OT needs pt has all needed DME.    OT Assessment  Patient does not need any further OT services    Follow Up Recommendations  No OT follow up       Equipment Recommendations  None recommended by OT          Precautions / Restrictions Precautions Precautions: Back Precaution Booklet Issued: Yes (comment) Precaution Comments: educated pt,. in "BAT" back precautions and log rolling technique Required Braces or Orthoses:  (no order for brace in chart, no brace in pt. room) Restrictions Weight Bearing Restrictions: No   Pertinent Vitals/Pain No significant pain reported during therapy, pt reports increased pain at incision site if he lays on his back    ADL  Eating/Feeding: Simulated;Independent Where Assessed - Eating/Feeding: Wheelchair Grooming: Simulated;Modified independent Where Assessed - Grooming: Supported standing Upper Body Bathing: Simulated;Set up Where Assessed - Upper Body Bathing: Unsupported sitting Lower Body Bathing: Simulated;Supervision/safety Where Assessed - Lower Body Bathing: Supported sit to stand Upper Body Dressing: Simulated;Set up Where Assessed - Upper Body Dressing: Unsupported sitting Lower Body Dressing: Simulated;Supervision/safety Where Assessed - Lower Body Dressing: Supported sit to stand;Other (comment) (with use of reacher and sockaide) Toilet Transfer: Performed;Modified independent Toilet Transfer Method: Other (comment) (ambulate  with RW) Toilet Transfer Equipment: Comfort height toilet Toileting - Clothing Manipulation and Hygiene: Simulated;Modified independent Where Assessed - Toileting Clothing Manipulation and Hygiene: Sit to stand from 3-in-1 or toilet Tub/Shower Transfer: Simulated;Modified independent Tub/Shower Transfer Method: Ambulating;Other (comment) (step over edge of simulated walk-in shower) Tub/Shower Transfer Equipment: Walk in shower Equipment Used: Rolling walker;Back brace;Reacher;Sock aid Transfers/Ambulation Related to ADLs: Pt overall supervision level to modified independent for mobility. ADL Comments: Pt able to state 3/3 back precautions.  Educated pt and girlfriend on AE use for LB selfcare as well as toileting.  Discussed specific techniques for toilet hygiene, LB bathing and dressing,  bed mobility and functional transfers.  No further acute or post acute OT needs.       Visit Information  Last OT Received On: 04/13/12 Assistance Needed: +1    Subjective Data  Subjective: I think I will be able to get up off of the toilet without any problem. Patient Stated Goal: Did not state but agreeable to participating in OT session.   Prior Functioning     Home Living Lives With: Son;Other (Comment) (57 year old son to assist upon DC) Available Help at Discharge: Available PRN/intermittently Type of Home: House Home Access: Stairs to enter Entrance Stairs-Number of Steps: 3 Entrance Stairs-Rails: Right;Left Home Layout: One level Bathroom Shower/Tub: Walk-in shower;Door Teacher, early years/pre: Yes How Accessible: Accessible via walker Home Adaptive Equipment: None Prior Function Level of Independence: Independent Able to Take Stairs?: Yes Driving: Yes Vocation: Full time employment Communication Communication: No difficulties Dominant Hand: Right         Vision/Perception Vision - Assessment Eye Alignment: Within Functional Limits Vision  Assessment: Vision not tested Perception Perception: Within Functional Limits Praxis Praxis: Intact   Cognition  Overall Cognitive Status: Appears within functional limits for tasks assessed/performed Arousal/Alertness: Awake/alert Orientation Level: Appears intact for tasks assessed Behavior During Session: [facility] for tasks performed    Extremity/Trunk Assessment Right Upper Extremity Assessment RUE ROM/Strength/Tone: [facility] for tasks assessed;Unable to fully assess;Due to precautions RUE Sensation: WFL - Light Touch RUE Coordination: WFL - gross/fine motor Left Upper Extremity Assessment LUE ROM/Strength/Tone: Within functional levels;WFL for tasks assessed;Unable to fully assess;Due to precautions LUE Sensation: WFL - Light Touch LUE Coordination: WFL - gross/fine motor Trunk Assessment Trunk Assessment: Normal     Mobility Bed Mobility Bed Mobility: Sit to Supine Sit to Supine: 5: Supervision;HOB flat Details for Bed Mobility Assistance: min instructional cueing for correct technique for sit to supine. Transfers Transfers: Sit to Stand Sit to Stand: 5: Supervision;With upper extremity assist Stand to Sit: 5: Supervision;With upper extremity assist           Balance Balance Balance Assessed: Yes Dynamic Standing Balance Dynamic Standing - Balance Support: Right upper extremity supported;Left upper extremity supported Dynamic Standing - Level of Assistance: 5: Stand by assistance   End of Session OT - End of Session Equipment Utilized During Treatment: Back brace Activity Tolerance: Patient tolerated treatment well Patient left: in bed;with call bell/phone within reach;with family/visitor present Nurse Communication: Mobility status     Birdell Frasier OTR/L Pager number [phone] 04/13/2012, 3:26 PM

## 2012-04-13 NOTE — Progress Notes (Signed)
Upon admission to the unit assessment around 2017 Pt back incision has minimal serosanguinous drainage. Site opened to air with derma bond intact. Dr. Alben Deeds notified of drainage. Pt sheet changed. This am pt continue to have minimal drainage with no odor. Pt sheet changed and dry dsg applied to site. Incoming RN notified and will continue to monitor.

## 2012-04-13 NOTE — Progress Notes (Signed)
Orthopedic Tech Progress Note Patient Details:  Troy Franklin [birth year] [id number]  Patient ID: Troy Franklin, male   DOB: [birth year], 42 y.o.   MRN: [id number] Called in bio-tech brace order @1340 ; spoke with Maudie Mercury, Willona Phariss 04/13/2012, 1:39 PM

## 2012-04-13 NOTE — Progress Notes (Signed)
Subjective: Patient reports Feels reasonably comfortable.  Objective: Vital signs in last 24 hours: Temp:  [97.4 F (36.3 C)-98.1 F (36.7 C)] 98.1 F (36.7 C) (02/01 1005) Pulse Rate:  [71-94] 84  (02/01 1005) Resp:  [14-20] 18  (02/01 1005) BP: (109-143)/(62-82) 126/82 mmHg (02/01 1005) SpO2:  [95 %-100 %] 100 % (02/01 1005)  Intake/Output from previous day: 01/31 0701 - 02/01 0700 In: 3114 [I.V.:2864; IV Piggyback:250] Out: 3975 [Urine:3625; Blood:350] Intake/Output this shift:    Some bleedthrough on dressing. Dressing on back changed. Motor function appears intact.  Lab Results: No results found for this basename: WBC:2,HGB:2,HCT:2,PLT:2 in the last 72 hours BMET No results found for this basename: NA:2,K:2,CL:2,CO2:2,GLUCOSE:2,BUN:2,CREATININE:2,CALCIUM:2 in the last 72 hours  Studies/Results: Dg Lumbar Spine 2-3 Views  04/12/2012  *RADIOLOGY REPORT*  Clinical Data: Low back pain.  DG C-ARM 1-60 MIN,LUMBAR SPINE - 2-3 VIEW  Comparison: MRI 08/24/2011  Findings: Intraoperative C-arm films document bilateral L4 and L5 screws and interbody cages in preparation for L4-5 fusion.  IMPRESSION: As above.   Original Report Authenticated By: Davonna Belling, M.D.    Dg C-arm 1-60 Min  04/12/2012  *RADIOLOGY REPORT*  Clinical Data: Low back pain.  DG C-ARM 1-60 MIN,LUMBAR SPINE - 2-3 VIEW  Comparison: MRI 08/24/2011  Findings: Intraoperative C-arm films document bilateral L4 and L5 screws and interbody cages in preparation for L4-5 fusion.  IMPRESSION: As above.   Original Report Authenticated By: Davonna Belling, M.D.     Assessment/Plan: Stable postop day 1  LOS: 1 day  Mobilize apply lumbar corset when patient out of bed. Change dressing when necessary   Troy Franklin J 04/13/2012, 12:41 PM

## 2012-04-13 NOTE — Evaluation (Signed)
Physical Therapy Evaluation Patient Details Name: Troy Franklin MRN: [id number] DOB: November 22, 1970 Today's Date: 04/13/2012 Time: 0865-7846 PT Time Calculation (min): 45 min  PT Assessment / Plan / Recommendation Clinical Impression  Pt. was admitted for pain in low back , spondylolisthesis, lumbar stenosis with radicular symptoms at L 4-5.  He underwent PLIF at same level and now presents with decreased independence in functional mobility,transfers,  gait .  He will benefit from PT to address his mobility issues and provide back education for DC home.    PT Assessment  Patient needs continued PT services    Follow Up Recommendations  No PT follow up;Supervision - Intermittent    Does the patient have the potential to tolerate intense rehabilitation      Barriers to Discharge None      Equipment Recommendations  Rolling walker with 5" wheels    Recommendations for Other Services OT consult   Frequency Min 6X/week    Precautions / Restrictions Precautions Precautions: Back Precaution Booklet Issued: Yes (comment) Precaution Comments: educated pt,. in "BAT" back precautions and log rolling technique Required Braces or Orthoses:  (no order for brace in chart, no brace in pt. room) Restrictions Weight Bearing Restrictions: No   Pertinent Vitals/Pain Pain in back 2/10 throughout session.  PT. Positioned for comfort in recliner after walking.      Mobility  Bed Mobility Bed Mobility: Rolling Left;Left Sidelying to Sit;Sitting - Scoot to [place] of Bed Rolling Left: 4: Min assist;With rail Left Sidelying to Sit: 3: Mod assist;HOB flat Sitting - Scoot to Edge of Bed: 6: Modified independent (Device/Increase time) Details for Bed Mobility Assistance: pt. needed cues for technique and back precautions, mod assist at shoulders  to rise to sit from sidelying Transfers Transfers: Sit to Stand;Stand to Sit Sit to Stand: 4: Min assist;From bed;With upper extremity assist Stand to Sit: 4:  Min assist;To chair/3-in-1;With armrests Details for Transfer Assistance: cues for hand placement and technique Ambulation/Gait Ambulation/Gait Assistance: 4: Min guard Ambulation Distance (Feet): 120 Feet Assistive device: Rolling walker Ambulation/Gait Assistance Details: cues for turning technique in order to maintain back precautions, otherwse pt. managing well Gait Pattern: Step-through pattern;Decreased step length - right;Decreased step length - left Gait velocity: slowed Stairs: No    Shoulder Instructions     Exercises     PT Diagnosis: Difficulty walking;Acute pain  PT Problem List: Decreased activity tolerance;Decreased mobility;Decreased knowledge of use of DME;Decreased knowledge of precautions;Pain PT Treatment Interventions: DME instruction;Gait training;Stair training;Functional mobility training;Therapeutic activities;Patient/family education   PT Goals Acute Rehab PT Goals PT Goal Formulation: With patient Time For Goal Achievement: 04/20/12 Potential to Achieve Goals: Good Pt will Roll Supine to Right Side: with modified independence PT Goal: Rolling Supine to Right Side - Progress: Goal set today Pt will Roll Supine to Left Side: with modified independence PT Goal: Rolling Supine to Left Side - Progress: Goal set today Pt will go Supine/Side to Sit: with modified independence PT Goal: Supine/Side to Sit - Progress: Goal set today Pt will go Sit to Supine/Side: with modified independence PT Goal: Sit to Supine/Side - Progress: Goal set today Pt will go Sit to Stand: with modified independence PT Goal: Sit to Stand - Progress: Goal set today Pt will go Stand to Sit: with modified independence PT Goal: Stand to Sit - Progress: Goal set today Pt will Transfer Bed to Chair/Chair to Bed: with modified independence PT Transfer Goal: Bed to Chair/Chair to Bed - Progress: Goal set today Pt will  Ambulate: >150 feet;with modified independence;with least restrictive  assistive device PT Goal: Ambulate - Progress: Goal set today Pt will Go Up / Down Stairs: 3-5 stairs;with rail(s);with min assist PT Goal: Up/Down Stairs - Progress: Goal set today  Visit Information  Last PT Received On: 04/13/12 Assistance Needed: +1    Subjective Data  Subjective: "I had neck surgery in May of last year" Patient Stated Goal: return to working from home; hunting, fishing, boating   Prior Functioning  Home Living Lives With: Aeronautical engineer (Comment) (23 year old son to assist upon DC) Available Help at Discharge: Available PRN/intermittently Type of Home: House Home Access: Stairs to enter [employer] of Steps: 3 Entrance Stairs-Rails: Right;Left Home Layout: One level Bathroom Shower/Tub: Walk-in shower;Door [employer] Toilet: Pharmacist, community: Yes How Accessible: Accessible via walker Home Adaptive Equipment: None Prior Function Level of Independence: Independent Able to Take Stairs?: Yes Driving: Yes Vocation: Full time employment Communication Communication: No difficulties Dominant Hand: Right    Cognition  Overall Cognitive Status: Appears within functional limits for tasks assessed/performed Arousal/Alertness: Awake/alert Orientation Level: Oriented X4 / Intact Behavior During Session: [facility] for tasks performed    Extremity/Trunk Assessment Right Upper Extremity Assessment RUE ROM/Strength/Tone: [employer] for tasks assessed Left Upper Extremity Assessment LUE ROM/Strength/Tone: WFL for tasks assessed Right Lower Extremity Assessment RLE ROM/Strength/Tone: [facility] for tasks assessed Left Lower Extremity Assessment LLE ROM/Strength/Tone: WFL for tasks assessed Trunk Assessment Trunk Assessment: Normal   Balance    End of Session PT - End of Session Equipment Utilized During Treatment: Gait belt Activity Tolerance: Patient tolerated treatment well Patient left: in chair;with call bell/phone within reach;with nursing in room Nurse  Communication: Mobility status  GP     Ferman Hamming 04/13/2012, 10:23 AM Weldon Picking PT Acute Rehab Services [phone] Beeper [phone]

## 2012-04-14 MED ORDER — OXYCODONE HCL 5 MG PO TABS: 5.0000 mg | ORAL_TABLET | ORAL | Status: DC | PRN

## 2012-04-14 MED ORDER — DIAZEPAM 5 MG PO TABS: 5.0000 mg | ORAL_TABLET | Freq: Four times a day (QID) | ORAL | Status: DC | PRN

## 2012-04-14 MED ORDER — MAGNESIUM HYDROXIDE 400 MG/5ML PO SUSP: 30.0000 mL | Freq: Every day | ORAL | Status: DC | PRN

## 2012-04-14 NOTE — Progress Notes (Signed)
Subjective: Patient reports Constipation but otherwise legs feel good  Objective: Vital signs in last 24 hours: Temp:  [98 F (36.7 C)-98.9 F (37.2 C)] 98.2 F (36.8 C) (02/02 0611) Pulse Rate:  [73-109] 105  (02/02 0611) Resp:  [18] 18  (02/02 0611) BP: (126-140)/(60-82) 140/69 mmHg (02/02 0611) SpO2:  [95 %-100 %] 95 % (02/02 0611) Weight:  [86.183 kg (190 lb)] 86.183 kg (190 lb) (02/01 1818)  Intake/Output from previous day: 02/01 0701 - 02/02 0700 In: -  Out: 1450 [Urine:1450] Intake/Output this shift:    Dressing clean and dry motor function intact  Lab Results: No results found for this basename: WBC:2,HGB:2,HCT:2,PLT:2 in the last 72 hours BMET No results found for this basename: NA:2,K:2,CL:2,CO2:2,GLUCOSE:2,BUN:2,CREATININE:2,CALCIUM:2 in the last 72 hours  Studies/Results: Dg Lumbar Spine 2-3 Views  04/12/2012  *RADIOLOGY REPORT*  Clinical Data: Low back pain.  DG C-ARM 1-60 MIN,LUMBAR SPINE - 2-3 VIEW  Comparison: MRI 08/24/2011  Findings: Intraoperative C-arm films document bilateral L4 and L5 screws and interbody cages in preparation for L4-5 fusion.  IMPRESSION: As above.   Original Report Authenticated By: Davonna Belling, M.D.    Dg C-arm 1-60 Min  04/12/2012  *RADIOLOGY REPORT*  Clinical Data: Low back pain.  DG C-ARM 1-60 MIN,LUMBAR SPINE - 2-3 VIEW  Comparison: MRI 08/24/2011  Findings: Intraoperative C-arm films document bilateral L4 and L5 screws and interbody cages in preparation for L4-5 fusion.  IMPRESSION: As above.   Original Report Authenticated By: Davonna Belling, M.D.     Assessment/Plan: Stable postop day 2  LOS: 2 days  we'll add laxatives for bowel movement .   Cheryl Chay J 04/14/2012, 8:31 AM

## 2012-04-14 NOTE — Progress Notes (Signed)
Patient has low grade temp. And not feeling well. Reports incision is very sore. Will continue to monitor patient and pain. Will call physician for temperature over 101.  Troy Franklin

## 2012-04-14 NOTE — Progress Notes (Signed)
Physical Therapy Treatment Patient Details Name: Troy Franklin MRN: [id number] DOB: Nov 20, 1970 Today's Date: 04/14/2012 Time: 7829-5621 PT Time Calculation (min): 29 min  PT Assessment / Plan / Recommendation Comments on Treatment Session  Pt making good progress with mobility & PT goals at this date.   Able to perform stairs & increase ambulation distance.      Follow Up Recommendations  No PT follow up;Supervision - Intermittent     Does the patient have the potential to tolerate intense rehabilitation     Barriers to Discharge        Equipment Recommendations  Rolling walker with 5" wheels    Recommendations for Other Services    Frequency Min 6X/week   Plan Discharge plan remains appropriate    Precautions / Restrictions Precautions Precautions: Back Precaution Comments: Pt able to recall all 3 back precautions & did well maintaining with functional activity Required Braces or Orthoses: Spinal Brace Spinal Brace: Applied in sitting position Restrictions Weight Bearing Restrictions: No       Mobility  Bed Mobility Bed Mobility: Rolling Left;Left Sidelying to Sit;Sitting - Scoot to [place] of Bed;Sit to Sidelying Left Rolling Left: 5: Supervision Left Sidelying to Sit: 5: Supervision;HOB flat Sitting - Scoot to Edge of Bed: 6: Modified independent (Device/Increase time) Sit to Supine: 5: Supervision;HOB flat Sit to Sidelying Left: 4: Min assist;HOB flat Details for Bed Mobility Assistance: Cues for sequencing & technique.  (A) to align shoulders/hips/LE's properly once he was supine in bed.   Transfers Transfers: Sit to Stand;Stand to Sit Sit to Stand: 5: Supervision;With upper extremity assist;From bed;From chair/3-in-1 Stand to Sit: 5: Supervision;With upper extremity assist;To bed;To chair/3-in-1 Details for Transfer Assistance: Cues for hand placement & technique.   Ambulation/Gait Ambulation/Gait Assistance: 5: Supervision Assistive device: Rolling  walker Ambulation/Gait Assistance Details: Pt with stiff/guarded posture but is steady.  Cues to attempt increased gait speed for more functional speed.   Gait Pattern: Step-through pattern;Decreased stride length;Antalgic (Rt LE discomfort with increased step length per pt) Gait velocity: decreased General Gait Details: Pt c/o discomfort in Rt LE/posterior thigh as he swings through with it.   Stairs: Yes Stairs Assistance: 4: Min guard Stair Management Technique: One rail Right;Sideways;Step to pattern Number of Stairs: 4  Wheelchair Mobility Wheelchair Mobility: No     PT Goals Acute Rehab PT Goals Time For Goal Achievement: 04/20/12 Potential to Achieve Goals: Good Pt will Roll Supine to Right Side: with modified independence Pt will Roll Supine to Left Side: with modified independence PT Goal: Rolling Supine to Left Side - Progress: Progressing toward goal Pt will go Supine/Side to Sit: with modified independence PT Goal: Supine/Side to Sit - Progress: Progressing toward goal Pt will go Sit to Supine/Side: with modified independence PT Goal: Sit to Supine/Side - Progress: Progressing toward goal Pt will go Sit to Stand: with modified independence PT Goal: Sit to Stand - Progress: Progressing toward goal Pt will go Stand to Sit: with modified independence PT Goal: Stand to Sit - Progress: Progressing toward goal Pt will Transfer Bed to Chair/Chair to Bed: with modified independence Pt will Ambulate: >150 feet;with modified independence;with least restrictive assistive device PT Goal: Ambulate - Progress: Progressing toward goal Pt will Go Up / Down Stairs: 3-5 stairs;with rail(s);with min assist PT Goal: Up/Down Stairs - Progress: Progressing toward goal  Visit Information  Last PT Received On: 04/14/12 Assistance Needed: +1    Subjective Data      Cognition  Overall Cognitive Status: Appears within  functional limits for tasks assessed/performed Arousal/Alertness:  Awake/alert Orientation Level: Appears intact for tasks assessed Behavior During Session: [facility] for tasks performed    Balance     End of Session PT - End of Session Equipment Utilized During Treatment: Gait belt;Back brace Activity Tolerance: Patient tolerated treatment well Patient left: in bed;with call bell/phone within reach;with family/visitor present Nurse Communication: Mobility status    Verdell Face, [place] [phone] 04/14/2012

## 2012-04-14 NOTE — Progress Notes (Signed)
Patient requested to be discharged tomorrow,but after an order was obtained he requested again to leave early tomorrow morning since his ride just went home, all assessments remain unchanged at this time. Will pas  It on the in-coming nurse,though discharges papers and prescription have  been given.

## 2012-04-14 NOTE — Discharge Summary (Signed)
Physician Discharge Summary  Patient ID: Troy Franklin MRN: [id number] DOB/AGE: Jun 22, 1970 42 y.o.  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 rate 18, height 5\' 7"  (1.702 m), weight 86.183 kg (190 lb), SpO2 97.00%. Neurologic: Alert and oriented X 3, normal strength and tone. Normal symmetric reflexes. Normal coordination and gait Wound:CDI  Disposition: Home     Medication List     As of 04/14/2012  5:07 PM    TAKE these medications         aspirin-acetaminophen-caffeine 250-250-65 MG per tablet   Commonly known as: EXCEDRIN MIGRAINE   Take 1 tablet by mouth every 6 (six) hours as needed.      diazepam 5 MG tablet   Commonly known as: VALIUM   Take 1 tablet (5 mg total) by mouth every 6 (six) hours as needed (Muscle spasm).      ibuprofen 200 MG tablet   Commonly known as: ADVIL,MOTRIN   Take 800 mg by mouth every 6 (six) hours as needed. For pain      oxyCODONE 5 MG immediate release tablet   Commonly known as: Oxy IR/ROXICODONE   Take 1-2 tablets (5-10 mg total) by mouth every 4 (four) hours as needed for pain.         Signed: Dorian Heckle, MD 04/14/2012, 5:07 PM

## 2012-04-15 NOTE — Progress Notes (Signed)
Patient discharge at 1215 with all belongings and rolling walker . Patient given discharge instructions and reviewed by RN . Patient verbalized understanding of discharge instructions . Patient able to verbalize signs and symptoms of infection. Back incision with minimal swelling . Pink in color dermabond intact no drainage.   Cleotilde Neer

## 2012-04-15 NOTE — Progress Notes (Signed)
PT Cancellation Note  Patient Details Name: Troy Franklin MRN: [id number] DOB: Apr 15, 1970   Cancelled Treatment:     Pt d/cing home.     Verdell Face, [place] [phone] 04/15/2012

## 2012-04-15 NOTE — Progress Notes (Signed)
NCM spoke to pt and states he has family and friends that can assist him at home. He is requesting a RW. [facility] DME rep notified for DME for home. No other NCM needs identified. Isidoro Donning RN CCM Case Mgmt phone [phone]

## 2013-02-11 ENCOUNTER — Emergency Department (HOSPITAL_COMMUNITY)
Admission: EM | Admit: 2013-02-11 | Discharge: 2013-02-11 | Disposition: A | Discharge status: Home / Self Care | Payer: BC Managed Care – PPO | Attending: Emergency Medicine | Admitting: Emergency Medicine

## 2013-02-11 ENCOUNTER — Encounter (HOSPITAL_COMMUNITY): Payer: Self-pay | Admitting: Emergency Medicine

## 2013-02-11 DIAGNOSIS — X58XXXA Exposure to other specified factors, initial encounter: Secondary | ICD-10-CM | POA: Insufficient documentation

## 2013-02-11 DIAGNOSIS — Z9889 Other specified postprocedural states: Secondary | ICD-10-CM | POA: Insufficient documentation

## 2013-02-11 DIAGNOSIS — M549 Dorsalgia, unspecified: Secondary | ICD-10-CM

## 2013-02-11 DIAGNOSIS — Y939 Activity, unspecified: Secondary | ICD-10-CM | POA: Insufficient documentation

## 2013-02-11 DIAGNOSIS — S39012A Strain of muscle, fascia and tendon of lower back, initial encounter: Secondary | ICD-10-CM

## 2013-02-11 DIAGNOSIS — M129 Arthropathy, unspecified: Secondary | ICD-10-CM | POA: Insufficient documentation

## 2013-02-11 DIAGNOSIS — Y929 Unspecified place or not applicable: Secondary | ICD-10-CM | POA: Insufficient documentation

## 2013-02-11 DIAGNOSIS — F17200 Nicotine dependence, unspecified, uncomplicated: Secondary | ICD-10-CM | POA: Insufficient documentation

## 2013-02-11 DIAGNOSIS — S335XXA Sprain of ligaments of lumbar spine, initial encounter: Secondary | ICD-10-CM | POA: Insufficient documentation

## 2013-02-11 MED ORDER — PREDNISONE 50 MG PO TABS
60.0000 mg | ORAL_TABLET | Freq: Once | ORAL | Status: AC
  Administered 2013-02-11: 60 mg via ORAL
  Filled 2013-02-11 (×2): qty 1

## 2013-02-11 MED ORDER — PREDNISONE 20 MG PO TABS: ORAL_TABLET | ORAL | Status: DC

## 2013-02-11 MED ORDER — DIAZEPAM 5 MG PO TABS: 5.0000 mg | ORAL_TABLET | Freq: Two times a day (BID) | ORAL | Status: DC

## 2013-02-11 NOTE — ED Provider Notes (Signed)
CSN: [id number]     Arrival date & time 02/11/13  1507 History   First MD Initiated Contact with Patient 02/11/13 1521     Chief Complaint  Patient presents with  . Back Pain   (Consider location/radiation/quality/duration/timing/severity/associated sxs/prior Treatment) HPI Comments: Patient is a 42 year old male with a past medical history of low back surgery in January 2014 who presents to the emergency department complaining of gradual onset right-sided low back pain x5 days. No known injury or trauma. Pain described as sharp and constant, non-radiating, worse when going from a laying to a seated position rated 9/10. States this does not feel the same as his prior muscle spasms. He tried taking oxycodone and flexeril left over from his surgery with minimal relief. Denies fever, chills, nausea, vomiting, difficulty urinating, increased urinary frequency, urgency, hematuria or dysuria.  The history is provided by the patient.    Past Medical History  Diagnosis Date  . Complication of anesthesia     difficulty waking up- sleepiness lasted longer than other times.   [name] PONV (postoperative nausea and vomiting)   . Loud breathing during sleep     snoring, states the sleep study was WNL, done betwn 1995-2005  . 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 Arthritis     lumbar   Past Surgical History  Procedure Laterality Date  . Knee ligament reconstruction      right   . Vasectomy    . Ankle fracture surgery      right - pins and screws  . Cervical fusion  2013  . Cervical fusion      2007 & 2013  . Hernia repair  1974    hydrocele  . Back surgery     History reviewed. No pertinent family history. History  Substance Use Topics  . Smoking status: Current Every Day Smoker -- 0.25 packs/day for 4 years    Types: Cigarettes  . Smokeless tobacco: Never Used  . Alcohol Use: Yes     Comment: socially- x1/month    Review of Systems  Musculoskeletal: Positive for back pain.    All other systems reviewed and are negative.    Allergies  Codeine and Onion  Home Medications   Current Outpatient Rx  Name  Route  Sig  Dispense  Refill  . aspirin-acetaminophen-caffeine (EXCEDRIN MIGRAINE) 250-250-65 MG per tablet   Oral   Take 1 tablet by mouth every 6 (six) hours as needed.         . cyclobenzaprine (FLEXERIL) 10 MG tablet   Oral   Take 10 mg by mouth 3 (three) times daily as needed for muscle spasms.         [name] ibuprofen (ADVIL,MOTRIN) 200 MG tablet   Oral   Take 800 mg by mouth every 6 (six) hours as needed. For pain         . oxyCODONE (OXY IR/ROXICODONE) 5 MG immediate release tablet   Oral   Take 1-2 tablets (5-10 mg total) by mouth every 4 (four) hours as needed for pain.   60 tablet   0   . diazepam (VALIUM) 5 MG tablet   Oral   Take 1 tablet (5 mg total) by mouth every 6 (six) hours as needed (Muscle spasm).   30 tablet   0   . diazepam (VALIUM) 5 MG tablet   Oral   Take 1 tablet (5 mg total) by mouth 2 (two) times daily.   10 tablet  0   . predniSONE (DELTASONE) 20 MG tablet      2 tabs po daily x 3 days   6 tablet   0    BP 157/91  Pulse 82  Temp(Src) 97.7 F (36.5 C) (Oral)  Resp 14  Ht 5\' 8"  (1.727 m)  Wt 190 lb (86.183 kg)  BMI 28.90 kg/m2  SpO2 98% Physical Exam  Nursing note and vitals reviewed. Constitutional: He is oriented to person, place, and time. He appears well-developed and well-nourished. No distress.  HENT:  Head: Normocephalic and atraumatic.  Mouth/Throat: Oropharynx is clear and moist.  Eyes: Conjunctivae are normal.  Neck: Normal range of motion. Neck supple.  Cardiovascular: Normal rate, regular rhythm and normal heart sounds.   Pulmonary/Chest: Effort normal and breath sounds normal.  Abdominal: Soft. Bowel sounds are normal. There is no tenderness. There is no CVA tenderness.  Musculoskeletal: Normal range of motion. He exhibits no edema.       Lumbar back: He exhibits no bony  tenderness and no edema.       Back:  TTP of right paralumbar muscles. Normal gait. Pain exacerbated by hip flexion.  Neurological: He is alert and oriented to person, place, and time.  Strength 5/5 lower extremities and equal bilateral. Sensation intact.  Skin: Skin is warm and dry. He is not diaphoretic.  Psychiatric: He has a normal mood and affect. His behavior is normal.    ED Course  Procedures (including critical care time) Labs Review Labs Reviewed - No data to display Imaging Review No results found.  EKG Interpretation   None       MDM   1. Back pain   2. Lumbar strain, initial encounter    No red flags concerning patient's back pain. No s/s of central cord compression or cauda equina. Lower extremities are neurovascularly intact and patient is ambulating without difficulty. He will f/u with Dr. Mikal Plane. Short course of steroids given, valium. Return precautions given. Patient states understanding of treatment care plan and is agreeable.    Trevor Mace, PA-C 02/11/13 1547

## 2013-02-11 NOTE — ED Provider Notes (Signed)
Medical screening examination/treatment/procedure(s) were performed by non-physician practitioner and as supervising physician I was immediately available for consultation/collaboration.  EKG Interpretation   None      Medical screening examination/treatment/procedure(s) were performed by non-physician practitioner and as supervising physician I was immediately available for consultation/collaboration.  EKG Interpretation   None        Donnetta Hutching, MD 02/11/13 1909

## 2013-02-11 NOTE — ED Notes (Signed)
Pt c/o R flank/back pain, onset Thursday. No known injury. Pt denies hx of kidney stones, has had back sx.

## 2013-12-18 ENCOUNTER — Other Ambulatory Visit (HOSPITAL_COMMUNITY): Payer: Self-pay | Admitting: Respiratory Therapy

## 2014-01-14 ENCOUNTER — Ambulatory Visit: Payer: Self-pay | Admitting: Family Medicine

## 2014-02-03 ENCOUNTER — Ambulatory Visit: Payer: Self-pay | Admitting: Family Medicine

## 2017-09-05 ENCOUNTER — Other Ambulatory Visit: Payer: Self-pay | Admitting: Neurosurgery

## 2017-09-05 DIAGNOSIS — M5441 Lumbago with sciatica, right side: Secondary | ICD-10-CM

## 2017-09-19 ENCOUNTER — Ambulatory Visit
Admission: RE | Admit: 2017-09-19 | Discharge: 2017-09-19 | Disposition: A | Discharge status: Home / Self Care | Payer: BLUE CROSS/BLUE SHIELD | Source: Ambulatory Visit | Attending: Neurosurgery | Admitting: Neurosurgery

## 2017-09-19 DIAGNOSIS — M48061 Spinal stenosis, lumbar region without neurogenic claudication: Secondary | ICD-10-CM | POA: Insufficient documentation

## 2017-09-19 DIAGNOSIS — M5441 Lumbago with sciatica, right side: Secondary | ICD-10-CM | POA: Diagnosis not present

## 2017-09-19 DIAGNOSIS — M47816 Spondylosis without myelopathy or radiculopathy, lumbar region: Secondary | ICD-10-CM | POA: Insufficient documentation

## 2017-09-19 DIAGNOSIS — M5136 Other intervertebral disc degeneration, lumbar region: Secondary | ICD-10-CM | POA: Insufficient documentation

## 2017-09-19 DIAGNOSIS — Z981 Arthrodesis status: Secondary | ICD-10-CM | POA: Insufficient documentation

## 2017-09-19 MED ORDER — GADOBENATE DIMEGLUMINE 529 MG/ML IV SOLN
18.0000 mL | Freq: Once | INTRAVENOUS | Status: AC | PRN
Start: 2017-09-19 — End: 2017-09-19
  Administered 2017-09-19: 18 mL via INTRAVENOUS

## 2017-10-03 ENCOUNTER — Other Ambulatory Visit: Payer: Self-pay | Admitting: Neurosurgery

## 2017-10-04 NOTE — Pre-Procedure Instructions (Signed)
Troy Franklin  10/04/2017      [facility] [place], [place] - [address] [place] [address] Phone: [phone] Fax: [phone]    Your procedure is scheduled on Wed., October 10, 2017 from 2:04PM-6:30PM  Report to [facility] Admitting Entrance "A" at 12:00 Noon  Call this number if you have problems the morning of surgery:  [phone]   Remember:  Do not eat or drink after midnight on July 30th    Take these medicines the morning of surgery with A SIP OF WATER: Omeprazole (PRILOSEC)  As of today, stop taking all Other Aspirin Products, Vitamins, Fish oils, and Herbal medications. Also stop all NSAIDS i.e. Advil, Ibuprofen, Motrin, Aleve, Anaprox, Naproxen, BC, Goody Powders, and all Supplements. Including: Meloxicam (MOBIC)     Do not wear jewelry.  Do not wear lotions, powders, colognes, or deodorant.  Do not shave 48 hours prior to surgery.  Men may shave face.  Do not bring valuables to the hospital.  [facility] is not responsible for any belongings or valuables.  Contacts, dentures or bridgework may not be worn into surgery.  Leave your suitcase in the car.  After surgery it may be brought to your room.  For patients admitted to the hospital, discharge time will be determined by your treatment team.  Patients discharged the day of surgery will not be allowed to drive home.   Special instructions:  [facility]- Preparing For Surgery  Before surgery, you can play an important role. Because skin is not sterile, your skin needs to be as free of germs as possible. You can reduce the number of germs on your skin by washing with CHG (chlorahexidine gluconate) Soap before surgery.  CHG is an antiseptic cleaner which kills germs and bonds with the skin to continue killing germs even after washing.    Oral Hygiene is also important to reduce your risk of infection.  Remember - BRUSH YOUR TEETH THE MORNING OF SURGERY WITH YOUR  REGULAR TOOTHPASTE  Please do not use if you have an allergy to CHG or antibacterial soaps. If your skin becomes reddened/irritated stop using the CHG.  Do not shave (including legs and underarms) for at least 48 hours prior to first CHG shower. It is OK to shave your face.  Please follow these instructions carefully.   1. Shower the NIGHT BEFORE SURGERY and the MORNING OF SURGERY with CHG.   2. If you chose to wash your hair, wash your hair first as usual with your normal shampoo.  3. After you shampoo, rinse your hair and body thoroughly to remove the shampoo.  4. Use CHG as you would any other liquid soap. You can apply CHG directly to the skin and wash gently with a scrungie or a clean washcloth.   5. Apply the CHG Soap to your body ONLY FROM THE NECK DOWN.  Do not use on open wounds or open sores. Avoid contact with your eyes, ears, mouth and genitals (private parts). Wash Face and genitals (private parts)  with your normal soap.  6. Wash thoroughly, paying special attention to the area where your surgery will be performed.  7. Thoroughly rinse your body with warm water from the neck down.  8. DO NOT shower/wash with your normal soap after using and rinsing off the CHG Soap.  9. Pat yourself dry with a CLEAN TOWEL.  10. Wear CLEAN PAJAMAS to bed the night before surgery, wear  comfortable clothes the morning of surgery  11. Place CLEAN SHEETS on your bed the night of your first shower and DO NOT SLEEP WITH PETS.  Day of Surgery:  Do not apply any deodorants/lotions.  Please wear clean clothes to the hospital/surgery center.   Remember to brush your teeth WITH YOUR REGULAR TOOTHPASTE.  Please read over the following fact sheets that you were given. Pain Booklet, Coughing and Deep Breathing, MRSA Information and Surgical Site Infection Prevention

## 2017-10-05 ENCOUNTER — Other Ambulatory Visit: Payer: Self-pay

## 2017-10-05 ENCOUNTER — Encounter (HOSPITAL_COMMUNITY)
Admission: RE | Admit: 2017-10-05 | Discharge: 2017-10-05 | Disposition: A | Discharge status: Home / Self Care | Payer: BLUE CROSS/BLUE SHIELD | Source: Ambulatory Visit | Attending: Neurosurgery | Admitting: Neurosurgery

## 2017-10-05 ENCOUNTER — Encounter (HOSPITAL_COMMUNITY): Payer: Self-pay

## 2017-10-05 DIAGNOSIS — Z01812 Encounter for preprocedural laboratory examination: Secondary | ICD-10-CM | POA: Diagnosis not present

## 2017-10-05 DIAGNOSIS — Z01810 Encounter for preprocedural cardiovascular examination: Secondary | ICD-10-CM | POA: Insufficient documentation

## 2017-10-05 DIAGNOSIS — I498 Other specified cardiac arrhythmias: Secondary | ICD-10-CM | POA: Insufficient documentation

## 2017-10-05 HISTORY — DX: Sleep apnea, unspecified: G47.30

## 2017-10-05 HISTORY — DX: Essential (primary) hypertension: I10

## 2017-10-05 HISTORY — DX: Gastro-esophageal reflux disease without esophagitis: K21.9

## 2017-10-05 HISTORY — DX: Spondylolisthesis, lumbar region: M43.16

## 2017-10-05 LAB — CBC
HCT: 44.6 % (ref 39.0–52.0)
Hemoglobin: 15 g/dL (ref 13.0–17.0)
MCH: 30.9 pg (ref 26.0–34.0)
MCHC: 33.6 g/dL (ref 30.0–36.0)
MCV: 92 fL (ref 78.0–100.0)
Platelets: 141 10*3/uL — ABNORMAL LOW (ref 150–400)
RBC: 4.85 MIL/uL (ref 4.22–5.81)
RDW: 12 % (ref 11.5–15.5)
WBC: 7 10*3/uL (ref 4.0–10.5)

## 2017-10-05 LAB — BASIC METABOLIC PANEL
Anion gap: 9 (ref 5–15)
BUN: 13 mg/dL (ref 6–20)
CO2: 25 mmol/L (ref 22–32)
Calcium: 9.3 mg/dL (ref 8.9–10.3)
Chloride: 106 mmol/L (ref 98–111)
Creatinine, Ser: 1.06 mg/dL (ref 0.61–1.24)
GFR calc Af Amer: >60 mL/min (ref >60)
GFR calc non Af Amer: >60 mL/min (ref >60)
Glucose, Bld: 100 mg/dL — ABNORMAL HIGH (ref 70–99)
Potassium: 4.1 mmol/L (ref 3.5–5.1)
Sodium: 140 mmol/L (ref 135–145)

## 2017-10-05 LAB — TYPE AND SCREEN
ABO/RH(D): O POS
Antibody Screen: NEGATIVE

## 2017-10-05 LAB — SURGICAL PCR SCREEN
MRSA, PCR: NEGATIVE
Staphylococcus aureus: NEGATIVE

## 2017-10-05 NOTE — Progress Notes (Signed)
[facility]  Cardiologist - Denies  Chest x-ray - Denies  EKG - 10/05/17  Stress Test - Denies  ECHO - Denies  Cardiac Cath - Denies  Sleep Study - Yes- Positive CPAP - Yes- Told to bring mask/hose dos  LABS- 10/05/17: CBC, BMP, T/S  ASA- Denies   Anesthesia- No  Pt denies having chest pain, sob, or fever at this time. All instructions explained to the pt, with a verbal understanding of the material. Pt agrees to go over the instructions while at home for a better understanding. The opportunity to ask questions was provided.

## 2017-10-09 ENCOUNTER — Other Ambulatory Visit: Payer: Self-pay | Admitting: Neurosurgery

## 2017-10-10 ENCOUNTER — Inpatient Hospital Stay (HOSPITAL_COMMUNITY): Payer: BLUE CROSS/BLUE SHIELD | Admitting: Anesthesiology

## 2017-10-10 ENCOUNTER — Inpatient Hospital Stay (HOSPITAL_COMMUNITY)
Admission: RE | Admit: 2017-10-10 | Discharge: 2017-10-13 | DRG: 460 | Disposition: A | Discharge status: Home / Self Care | Payer: BLUE CROSS/BLUE SHIELD | Source: Ambulatory Visit | Attending: Neurosurgery | Admitting: Neurosurgery

## 2017-10-10 ENCOUNTER — Encounter (HOSPITAL_COMMUNITY)
Admission: RE | Disposition: A | Discharge status: Home / Self Care | Payer: Self-pay | Source: Ambulatory Visit | Attending: Neurosurgery

## 2017-10-10 ENCOUNTER — Inpatient Hospital Stay (HOSPITAL_COMMUNITY): Payer: BLUE CROSS/BLUE SHIELD

## 2017-10-10 ENCOUNTER — Encounter (HOSPITAL_COMMUNITY): Payer: Self-pay | Admitting: Anesthesiology

## 2017-10-10 DIAGNOSIS — M4316 Spondylolisthesis, lumbar region: Secondary | ICD-10-CM | POA: Diagnosis present

## 2017-10-10 DIAGNOSIS — G4730 Sleep apnea, unspecified: Secondary | ICD-10-CM | POA: Diagnosis present

## 2017-10-10 DIAGNOSIS — I10 Essential (primary) hypertension: Secondary | ICD-10-CM | POA: Diagnosis present

## 2017-10-10 DIAGNOSIS — Z419 Encounter for procedure for purposes other than remedying health state, unspecified: Secondary | ICD-10-CM

## 2017-10-10 DIAGNOSIS — Z6834 Body mass index (BMI) 34.0-34.9, adult: Secondary | ICD-10-CM

## 2017-10-10 DIAGNOSIS — Z91018 Allergy to other foods: Secondary | ICD-10-CM

## 2017-10-10 DIAGNOSIS — F17200 Nicotine dependence, unspecified, uncomplicated: Secondary | ICD-10-CM | POA: Diagnosis present

## 2017-10-10 DIAGNOSIS — Z885 Allergy status to narcotic agent status: Secondary | ICD-10-CM

## 2017-10-10 DIAGNOSIS — M48062 Spinal stenosis, lumbar region with neurogenic claudication: Principal | ICD-10-CM | POA: Diagnosis present

## 2017-10-10 HISTORY — PX: LUMBAR LAMINECTOMY/DECOMPRESSION MICRODISCECTOMY: SHX5026

## 2017-10-10 SURGERY — LUMBAR LAMINECTOMY/DECOMPRESSION MICRODISCECTOMY 1 LEVEL
Anesthesia: General | Site: Spine Lumbar

## 2017-10-10 MED ORDER — ONDANSETRON HCL 4 MG/2ML IJ SOLN
4.0000 mg | Freq: Four times a day (QID) | INTRAMUSCULAR | Status: DC | PRN
  Administered 2017-10-10: 4 mg via INTRAVENOUS
  Filled 2017-10-10: qty 2

## 2017-10-10 MED ORDER — ACETAMINOPHEN 325 MG PO TABS
650.0000 mg | ORAL_TABLET | ORAL | Status: DC | PRN
  Administered 2017-10-12 – 2017-10-13 (×2): 650 mg via ORAL
  Filled 2017-10-10 (×2): qty 2

## 2017-10-10 MED ORDER — MENTHOL 3 MG MT LOZG: 1.0000 | LOZENGE | OROMUCOSAL | Status: DC | PRN

## 2017-10-10 MED ORDER — ONDANSETRON HCL 4 MG/2ML IJ SOLN
4.0000 mg | Freq: Four times a day (QID) | INTRAMUSCULAR | Status: AC | PRN
  Administered 2017-10-10: 4 mg via INTRAVENOUS

## 2017-10-10 MED ORDER — TELMISARTAN-HCTZ 40-12.5 MG PO TABS: 1.0000 | ORAL_TABLET | Freq: Every day | ORAL | Status: DC

## 2017-10-10 MED ORDER — ONDANSETRON HCL 4 MG/2ML IJ SOLN
INTRAMUSCULAR | Status: AC
  Filled 2017-10-10: qty 2

## 2017-10-10 MED ORDER — ACETAMINOPHEN 650 MG RE SUPP: 650.0000 mg | RECTAL | Status: DC | PRN

## 2017-10-10 MED ORDER — MIDAZOLAM HCL 2 MG/2ML IJ SOLN
INTRAMUSCULAR | Status: AC
  Filled 2017-10-10: qty 2

## 2017-10-10 MED ORDER — ROCURONIUM BROMIDE 100 MG/10ML IV SOLN
INTRAVENOUS | Status: DC | PRN
  Administered 2017-10-10: 50 mg via INTRAVENOUS
  Administered 2017-10-10: 10 mg via INTRAVENOUS

## 2017-10-10 MED ORDER — FENTANYL CITRATE (PF) 100 MCG/2ML IJ SOLN
INTRAMUSCULAR | Status: AC
  Filled 2017-10-10: qty 2

## 2017-10-10 MED ORDER — DIAZEPAM 5 MG PO TABS
5.0000 mg | ORAL_TABLET | Freq: Four times a day (QID) | ORAL | Status: DC | PRN
  Administered 2017-10-10 – 2017-10-13 (×6): 5 mg via ORAL
  Filled 2017-10-10 (×6): qty 1

## 2017-10-10 MED ORDER — CHLORHEXIDINE GLUCONATE CLOTH 2 % EX PADS: 6.0000 | MEDICATED_PAD | Freq: Once | CUTANEOUS | Status: DC

## 2017-10-10 MED ORDER — KETOROLAC TROMETHAMINE 15 MG/ML IJ SOLN
15.0000 mg | Freq: Four times a day (QID) | INTRAMUSCULAR | Status: AC
  Administered 2017-10-10 – 2017-10-11 (×4): 15 mg via INTRAVENOUS
  Filled 2017-10-10 (×4): qty 1

## 2017-10-10 MED ORDER — SODIUM CHLORIDE 0.9 % IV SOLN
250.0000 mL | INTRAVENOUS | Status: DC
  Administered 2017-10-10: 250 mL via INTRAVENOUS

## 2017-10-10 MED ORDER — FENTANYL CITRATE (PF) 250 MCG/5ML IJ SOLN
INTRAMUSCULAR | Status: AC
  Filled 2017-10-10: qty 5

## 2017-10-10 MED ORDER — FENTANYL CITRATE (PF) 100 MCG/2ML IJ SOLN
INTRAMUSCULAR | Status: DC | PRN
  Administered 2017-10-10: 100 ug via INTRAVENOUS

## 2017-10-10 MED ORDER — OXYCODONE HCL 5 MG PO TABS: 5.0000 mg | ORAL_TABLET | Freq: Once | ORAL | Status: DC | PRN

## 2017-10-10 MED ORDER — ONDANSETRON HCL 4 MG PO TABS: 4.0000 mg | ORAL_TABLET | Freq: Four times a day (QID) | ORAL | Status: DC | PRN

## 2017-10-10 MED ORDER — ONDANSETRON HCL 4 MG/2ML IJ SOLN
INTRAMUSCULAR | Status: DC | PRN
  Administered 2017-10-10: 4 mg via INTRAVENOUS

## 2017-10-10 MED ORDER — SODIUM CHLORIDE 0.9% FLUSH: 3.0000 mL | INTRAVENOUS | Status: DC | PRN

## 2017-10-10 MED ORDER — BUPIVACAINE HCL (PF) 0.5 % IJ SOLN
INTRAMUSCULAR | Status: AC
Start: 2017-10-10 — End: ?
  Filled 2017-10-10: qty 30

## 2017-10-10 MED ORDER — HEMOSTATIC AGENTS (NO CHARGE) OPTIME
TOPICAL | Status: DC | PRN
  Administered 2017-10-10: 1 via TOPICAL

## 2017-10-10 MED ORDER — SUGAMMADEX SODIUM 200 MG/2ML IV SOLN
INTRAVENOUS | Status: DC | PRN
  Administered 2017-10-10: 250 mg via INTRAVENOUS

## 2017-10-10 MED ORDER — HYDROCHLOROTHIAZIDE 12.5 MG PO CAPS
12.5000 mg | ORAL_CAPSULE | Freq: Every day | ORAL | Status: DC
  Administered 2017-10-10 – 2017-10-12 (×3): 12.5 mg via ORAL
  Filled 2017-10-10 (×4): qty 1

## 2017-10-10 MED ORDER — LIDOCAINE-EPINEPHRINE 0.5 %-1:200000 IJ SOLN
INTRAMUSCULAR | Status: DC | PRN
  Administered 2017-10-10: 10 mL

## 2017-10-10 MED ORDER — MIDAZOLAM HCL 5 MG/5ML IJ SOLN
INTRAMUSCULAR | Status: DC | PRN
  Administered 2017-10-10: 2 mg via INTRAVENOUS

## 2017-10-10 MED ORDER — ZOLPIDEM TARTRATE 5 MG PO TABS: 5.0000 mg | ORAL_TABLET | Freq: Every evening | ORAL | Status: DC | PRN

## 2017-10-10 MED ORDER — DIAZEPAM 5 MG PO TABS
ORAL_TABLET | ORAL | Status: AC
  Administered 2017-10-10: 5 mg
  Filled 2017-10-10: qty 1

## 2017-10-10 MED ORDER — THROMBIN 5000 UNITS EX SOLR
CUTANEOUS | Status: DC | PRN
  Administered 2017-10-10 (×2): 5000 [IU] via TOPICAL

## 2017-10-10 MED ORDER — PROPOFOL 10 MG/ML IV BOLUS
INTRAVENOUS | Status: AC
  Filled 2017-10-10: qty 20

## 2017-10-10 MED ORDER — IRBESARTAN 150 MG PO TABS
150.0000 mg | ORAL_TABLET | Freq: Every day | ORAL | Status: DC
  Administered 2017-10-10 – 2017-10-12 (×3): 150 mg via ORAL
  Filled 2017-10-10 (×4): qty 1

## 2017-10-10 MED ORDER — LACTATED RINGERS IV SOLN
INTRAVENOUS | Status: DC | PRN
  Administered 2017-10-10 (×2): via INTRAVENOUS

## 2017-10-10 MED ORDER — OXYCODONE HCL 5 MG/5ML PO SOLN: 5.0000 mg | Freq: Once | ORAL | Status: DC | PRN

## 2017-10-10 MED ORDER — FENTANYL CITRATE (PF) 100 MCG/2ML IJ SOLN
25.0000 ug | INTRAMUSCULAR | Status: DC | PRN
  Administered 2017-10-10: 25 ug via INTRAVENOUS

## 2017-10-10 MED ORDER — LIDOCAINE HCL (CARDIAC) PF 100 MG/5ML IV SOSY
PREFILLED_SYRINGE | INTRAVENOUS | Status: DC | PRN
  Administered 2017-10-10: 30 mg via INTRAVENOUS

## 2017-10-10 MED ORDER — SODIUM CHLORIDE 0.9% FLUSH
3.0000 mL | Freq: Two times a day (BID) | INTRAVENOUS | Status: DC
  Administered 2017-10-10 – 2017-10-12 (×4): 3 mL via INTRAVENOUS

## 2017-10-10 MED ORDER — THROMBIN 5000 UNITS EX SOLR
CUTANEOUS | Status: AC
  Filled 2017-10-10: qty 10000

## 2017-10-10 MED ORDER — PANTOPRAZOLE SODIUM 40 MG PO TBEC
40.0000 mg | DELAYED_RELEASE_TABLET | Freq: Every day | ORAL | Status: DC
  Administered 2017-10-10 – 2017-10-13 (×4): 40 mg via ORAL
  Filled 2017-10-10 (×4): qty 1

## 2017-10-10 MED ORDER — 0.9 % SODIUM CHLORIDE (POUR BTL) OPTIME
TOPICAL | Status: DC | PRN
  Administered 2017-10-10: 1000 mL

## 2017-10-10 MED ORDER — LIDOCAINE-EPINEPHRINE 0.5 %-1:200000 IJ SOLN
INTRAMUSCULAR | Status: AC
  Filled 2017-10-10: qty 1

## 2017-10-10 MED ORDER — POTASSIUM CHLORIDE IN NACL 20-0.9 MEQ/L-% IV SOLN
INTRAVENOUS | Status: DC
  Administered 2017-10-10 – 2017-10-12 (×2): via INTRAVENOUS
  Filled 2017-10-10 (×2): qty 1000

## 2017-10-10 MED ORDER — ACETAMINOPHEN 500 MG PO TABS
1000.0000 mg | ORAL_TABLET | Freq: Four times a day (QID) | ORAL | Status: AC
  Administered 2017-10-10 – 2017-10-11 (×2): 1000 mg via ORAL
  Filled 2017-10-10 (×3): qty 2

## 2017-10-10 MED ORDER — PROPOFOL 10 MG/ML IV BOLUS
INTRAVENOUS | Status: DC | PRN
  Administered 2017-10-10: 200 mg via INTRAVENOUS

## 2017-10-10 MED ORDER — PHENOL 1.4 % MT LIQD: 1.0000 | OROMUCOSAL | Status: DC | PRN

## 2017-10-10 MED ORDER — GABAPENTIN 300 MG PO CAPS
300.0000 mg | ORAL_CAPSULE | Freq: Three times a day (TID) | ORAL | Status: DC
  Administered 2017-10-10 – 2017-10-13 (×6): 300 mg via ORAL
  Filled 2017-10-10 (×6): qty 1

## 2017-10-10 MED ORDER — CEFAZOLIN SODIUM-DEXTROSE 2-4 GM/100ML-% IV SOLN
2.0000 g | INTRAVENOUS | Status: AC
  Administered 2017-10-10: 2 g via INTRAVENOUS
  Filled 2017-10-10: qty 100

## 2017-10-10 MED ORDER — LACTATED RINGERS IV SOLN
INTRAVENOUS | Status: DC
  Administered 2017-10-10: 12:00:00 via INTRAVENOUS

## 2017-10-10 SURGICAL SUPPLY — 53 items
ADH SKN CLS APL DERMABOND .7 (GAUZE/BANDAGES/DRESSINGS) ×1
APL SKNCLS STERI-STRIP NONHPOA (GAUZE/BANDAGES/DRESSINGS) ×1
BENZOIN TINCTURE PRP APPL 2/3 (GAUZE/BANDAGES/DRESSINGS) ×2 IMPLANT
BUR MATCHSTICK NEURO 3.0 LAGG (BURR) ×3 IMPLANT
BUR PRECISION FLUTE 5.0 (BURR) ×2 IMPLANT
CANISTER SUCT 3000ML PPV (MISCELLANEOUS) ×3 IMPLANT
CARTRIDGE OIL MAESTRO DRILL (MISCELLANEOUS) ×1 IMPLANT
DERMABOND ADVANCED (GAUZE/BANDAGES/DRESSINGS) ×2
DERMABOND ADVANCED .7 DNX12 (GAUZE/BANDAGES/DRESSINGS) ×1 IMPLANT
DIFFUSER DRILL AIR PNEUMATIC (MISCELLANEOUS) ×3 IMPLANT
DRAPE LAPAROTOMY 100X72X124 (DRAPES) ×3 IMPLANT
DRAPE MICROSCOPE LEICA (MISCELLANEOUS) ×3 IMPLANT
DRAPE SURG 17X23 STRL (DRAPES) ×3 IMPLANT
DRSG OPSITE POSTOP 4X6 (GAUZE/BANDAGES/DRESSINGS) ×2 IMPLANT
DURAPREP 26ML APPLICATOR (WOUND CARE) ×3 IMPLANT
ELECT REM PT RETURN 9FT ADLT (ELECTROSURGICAL) ×3
ELECTRODE REM PT RTRN 9FT ADLT (ELECTROSURGICAL) ×1 IMPLANT
GAUZE SPONGE 4X4 12PLY STRL (GAUZE/BANDAGES/DRESSINGS) IMPLANT
GAUZE SPONGE 4X4 16PLY XRAY LF (GAUZE/BANDAGES/DRESSINGS) IMPLANT
GLOVE BIOGEL PI IND STRL 6.5 (GLOVE) IMPLANT
GLOVE BIOGEL PI IND STRL 7.0 (GLOVE) IMPLANT
GLOVE BIOGEL PI IND STRL 7.5 (GLOVE) IMPLANT
GLOVE BIOGEL PI IND STRL 8 (GLOVE) IMPLANT
GLOVE BIOGEL PI INDICATOR 6.5 (GLOVE) ×6
GLOVE BIOGEL PI INDICATOR 7.0 (GLOVE) ×4
GLOVE BIOGEL PI INDICATOR 7.5 (GLOVE) ×2
GLOVE BIOGEL PI INDICATOR 8 (GLOVE) ×2
GLOVE ECLIPSE 6.5 STRL STRAW (GLOVE) ×3 IMPLANT
GOWN STRL REUS W/ TWL LRG LVL3 (GOWN DISPOSABLE) ×2 IMPLANT
GOWN STRL REUS W/ TWL XL LVL3 (GOWN DISPOSABLE) IMPLANT
GOWN STRL REUS W/TWL 2XL LVL3 (GOWN DISPOSABLE) IMPLANT
GOWN STRL REUS W/TWL LRG LVL3 (GOWN DISPOSABLE) ×6
GOWN STRL REUS W/TWL XL LVL3 (GOWN DISPOSABLE) ×6
KIT BASIN OR (CUSTOM PROCEDURE TRAY) ×3 IMPLANT
KIT TURNOVER KIT B (KITS) ×3 IMPLANT
NDL HYPO 25X1 1.5 SAFETY (NEEDLE) ×1 IMPLANT
NDL SPNL 18GX3.5 QUINCKE PK (NEEDLE) IMPLANT
NEEDLE HYPO 25X1 1.5 SAFETY (NEEDLE) ×3 IMPLANT
NEEDLE SPNL 18GX3.5 QUINCKE PK (NEEDLE) IMPLANT
NS IRRIG 1000ML POUR BTL (IV SOLUTION) ×3 IMPLANT
OIL CARTRIDGE MAESTRO DRILL (MISCELLANEOUS) ×3
PACK LAMINECTOMY NEURO (CUSTOM PROCEDURE TRAY) ×3 IMPLANT
PAD ARMBOARD 7.5X6 YLW CONV (MISCELLANEOUS) ×9 IMPLANT
RUBBERBAND STERILE (MISCELLANEOUS) ×6 IMPLANT
SPONGE LAP 4X18 RFD (DISPOSABLE) IMPLANT
SPONGE SURGIFOAM ABS GEL SZ50 (HEMOSTASIS) ×3 IMPLANT
SUT VIC AB 0 CT1 18XCR BRD8 (SUTURE) ×1 IMPLANT
SUT VIC AB 0 CT1 8-18 (SUTURE) ×6
SUT VIC AB 2-0 CT1 18 (SUTURE) ×3 IMPLANT
SUT VIC AB 3-0 SH 8-18 (SUTURE) ×3 IMPLANT
TOWEL GREEN STERILE (TOWEL DISPOSABLE) ×3 IMPLANT
TOWEL GREEN STERILE FF (TOWEL DISPOSABLE) ×3 IMPLANT
WATER STERILE IRR 1000ML POUR (IV SOLUTION) ×3 IMPLANT

## 2017-10-10 NOTE — Transfer of Care (Signed)
Immediate Anesthesia Transfer of Care Note  Patient: Troy Franklin  Procedure(s) Performed: Lumbar 3-4 Laminectomy (N/A )  Patient Location: PACU  Anesthesia Type:General  Level of Consciousness: awake and alert   Airway & Oxygen Therapy: Patient Spontanous Breathing and Patient connected to nasal cannula oxygen  Post-op Assessment: Report given to RN and Post -op Vital signs reviewed and stable  Post vital signs: Reviewed and stable  Last Vitals:  Vitals Value Taken Time  BP 138/75 10/10/2017  4:10 PM  Temp    Pulse 82 10/10/2017  4:16 PM  Resp 20 10/10/2017  4:16 PM  SpO2 95 % 10/10/2017  4:16 PM  Vitals shown include unvalidated device data.  Last Pain:  Vitals:   10/10/17 1210  TempSrc:   PainSc: 0-No pain      Patients Stated Pain Goal: 3 (98/10/25 4862)  Complications: No apparent anesthesia complications

## 2017-10-10 NOTE — H&P (Signed)
BP 135/85   Pulse 74   Temp 98 F (36.7 C) (Oral)   Resp 20   Ht 5\' 8"  (1.727 m)   Wt 101.6 kg (224 lb)   SpO2 98%   BMI 34.06 kg/m  Troy Franklin returns today after the MRI of the lumbar spine was performed.  What it shows is that he has no problem at the level of his fusion but the level above his fusion he is markedly stenotic centrally.  He also has significant lateral recess stenosis and retrolisthesis of L3 on 4, and foraminal narrowing.  He has ligamentum flavum hypertrophy which is contributing to the problem there also.  He is actually markedly worse today, limping almost on the right lower extremity.  He is alert, oriented by 4, answers all questions appropriately.  Memory, language, attention span, and fund of knowledge are normal.  He certainly has been taking his anti-inflammatories now for quite some time, unfortunately without success.  That certainly led to his visit and that certainly led to me ordering the MRI.  He has had some physical therapy and that has not helped with his pain.  This has been an ongoing problem for the last 6-9 months now.    The MRI shows severe stenosis present at that level.  The conus is normal, cauda equina is normal.  Paraspinous soft tissues are normal.   PHYSICAL EXAMINATION: He is alert, oriented by 4, in some distress.  Memory, language, attention and fund of knowledge are normal.  Gait:  He favors the right lower extremity, decided limp is easily visible.  He has a great deal of weakness in the right hip flexors, iliopsoas, some slight weakness in the right quadriceps.  He has normal strength on the left side.  He has 2+ reflexes at the knees and ankles bilaterally.   ASSESSMENT AND PLAN: I would like to perform a lumbar decompression at L3-4 with subsequent pedicle screw fixation at that same level.  However his insurance company has not yet provided approval. due to the pain he is experiencing he wishes to simply proceed with a simple decompression.  He understands that the risks including bleeding, infection, need for further surgery, csf leak, damage to nerve roots, bowel and or bladder dysfunction, and other risks were discussed.

## 2017-10-10 NOTE — Progress Notes (Signed)
Pt arrived to unit

## 2017-10-10 NOTE — Op Note (Signed)
10/10/2017  4:20 PM  PATIENT:  Troy Franklin  47 y.o. male  PRE-OPERATIVE DIAGNOSIS:  Spondylolisthesis, Lumbar Region, lumbar stenosis  POST-OPERATIVE DIAGNOSIS:  Spondylolisthesis, Lumbar Region Lumbar stenosis PROCEDURE:  Procedure(s): Lumbar 3-4 hemiLaminectomy  SURGEON: Surgeon(s): Ashok Pall, MD Kary Kos, MD  ASSISTANTS:Cram, Dominica Severin  ANESTHESIA:   general  EBL:  Total I/O In: 750 [I.V.:750] Out: 100 [Blood:100]  BLOOD ADMINISTERED:none  CELL SAVER GIVEN:none  COUNT:per nursing  DRAINS: none   SPECIMEN:  No Specimen  DICTATION: Troy Franklin was taken to the operating room, intubated, and placed under a general anesthetic without difficulty. He was positioned prone on the Wilson frame. His back was prepped and draped in a sterile manner. I opened the skin with a 10 blade and dissected sharply to the thoracolumbar fascia. I exposed the lamina of L3 bilaterally. I exposed the L4 pedicle screws. The facets were quite large and arthropathic. I used the drill and Kerrison punches to perform semihemilaminectomies of L3 bilaterally. Dr. Saintclair Halsted and I worked laterally in the canal with the punches to decompress the lateral recesses, and the neural elements. We removed enough bone centrally to easily pass an instrument from the left to the right side and vice versa. The ligamentum flavum was hypertrophied and compressing the thecal sac.  We inspected the decompression, and I felt since the facets at L3/4 were so deteriorated that enough had been done and the thecal sac and the L3 roots has less pressure than before the operation.  I then closed the wound approximating the thoracolumbar fascia, subcutaneous, and subcuticular planes with vicryl sutures. I applied dermabond for a sterile dressing. He was rolled onto his back on the or stretcher. Mr. Houp was extubated in the operating room.  PLAN OF CARE: Admit for overnight observation  PATIENT DISPOSITION:  PACU -  hemodynamically stable.   Delay start of Pharmacological VTE agent (>24hrs) due to surgical blood loss or risk of bleeding:  yes

## 2017-10-10 NOTE — Anesthesia Preprocedure Evaluation (Signed)
Anesthesia Evaluation  Patient identified by MRN, date of birth, ID band Patient awake    Reviewed: Allergy & Precautions, H&P , NPO status , Patient's Chart, lab work & pertinent test results  History of Anesthesia Complications (+) PONV and history of anesthetic complications  Airway Mallampati: II   Neck ROM: full    Dental   Pulmonary sleep apnea , Current Smoker,    breath sounds clear to auscultation       Cardiovascular hypertension,  Rhythm:regular Rate:Normal     Neuro/Psych  Headaches,    GI/Hepatic GERD  ,  Endo/Other    Renal/GU      Musculoskeletal  (+) Arthritis ,   Abdominal   Peds  Hematology   Anesthesia Other Findings   Reproductive/Obstetrics                             Anesthesia Physical Anesthesia Plan  ASA: II  Anesthesia Plan: General   Post-op Pain Management:    Induction: Intravenous  PONV Risk Score and Plan: 2 and Ondansetron, Dexamethasone, Scopolamine patch - Pre-op, Midazolam and Treatment may vary due to age or medical condition  Airway Management Planned: Oral ETT  Additional Equipment:   Intra-op Plan:   Post-operative Plan: Extubation in OR  Informed Consent: I have reviewed the patients History and Physical, chart, labs and discussed the procedure including the risks, benefits and alternatives for the proposed anesthesia with the patient or authorized representative who has indicated his/her understanding and acceptance.     Plan Discussed with: CRNA, Anesthesiologist and Surgeon  Anesthesia Plan Comments:         Anesthesia Quick Evaluation

## 2017-10-10 NOTE — Anesthesia Procedure Notes (Signed)
Procedure Name: Intubation Date/Time: 10/10/2017 1:56 PM Performed by: Eligha Bridegroom, CRNA Pre-anesthesia Checklist: Patient identified, Emergency Drugs available, Suction available, Patient being monitored and Timeout performed Patient Re-evaluated:Patient Re-evaluated prior to induction Oxygen Delivery Method: Circle system utilized Preoxygenation: Pre-oxygenation with 100% oxygen Induction Type: IV induction Ventilation: Mask ventilation without difficulty and Oral airway inserted - appropriate to patient size Laryngoscope Size: Mac and 4 Grade View: Grade III Tube size: 7.5 mm Airway Equipment and Method: Video-laryngoscopy and Stylet Placement Confirmation: ETT inserted through vocal cords under direct vision,  positive ETCO2 and breath sounds checked- equal and bilateral Secured at: 21 cm Tube secured with: Tape Dental Injury: Teeth and Oropharynx as per pre-operative assessment  Difficulty Due To: Difficulty was anticipated, Difficult Airway- due to reduced neck mobility and Difficult Airway- due to anterior larynx Future Recommendations: Recommend- induction with short-acting agent, and alternative techniques readily available

## 2017-10-11 ENCOUNTER — Other Ambulatory Visit: Payer: Self-pay

## 2017-10-11 ENCOUNTER — Ambulatory Visit (HOSPITAL_COMMUNITY): Payer: BLUE CROSS/BLUE SHIELD

## 2017-10-11 ENCOUNTER — Inpatient Hospital Stay (HOSPITAL_COMMUNITY)
Admission: RE | Disposition: A | Discharge status: Home / Self Care | Payer: Self-pay | Source: Ambulatory Visit | Attending: Neurosurgery

## 2017-10-11 ENCOUNTER — Ambulatory Visit (HOSPITAL_COMMUNITY): Payer: BLUE CROSS/BLUE SHIELD | Admitting: Certified Registered"

## 2017-10-11 ENCOUNTER — Encounter (HOSPITAL_COMMUNITY): Payer: Self-pay | Admitting: Neurosurgery

## 2017-10-11 ENCOUNTER — Other Ambulatory Visit: Payer: Self-pay | Admitting: Neurosurgery

## 2017-10-11 DIAGNOSIS — F17200 Nicotine dependence, unspecified, uncomplicated: Secondary | ICD-10-CM | POA: Diagnosis present

## 2017-10-11 DIAGNOSIS — Z885 Allergy status to narcotic agent status: Secondary | ICD-10-CM | POA: Diagnosis not present

## 2017-10-11 DIAGNOSIS — I10 Essential (primary) hypertension: Secondary | ICD-10-CM | POA: Diagnosis present

## 2017-10-11 DIAGNOSIS — Z6834 Body mass index (BMI) 34.0-34.9, adult: Secondary | ICD-10-CM | POA: Diagnosis not present

## 2017-10-11 DIAGNOSIS — M48062 Spinal stenosis, lumbar region with neurogenic claudication: Secondary | ICD-10-CM | POA: Diagnosis present

## 2017-10-11 DIAGNOSIS — G4730 Sleep apnea, unspecified: Secondary | ICD-10-CM | POA: Diagnosis present

## 2017-10-11 DIAGNOSIS — M4316 Spondylolisthesis, lumbar region: Secondary | ICD-10-CM | POA: Diagnosis present

## 2017-10-11 DIAGNOSIS — Z91018 Allergy to other foods: Secondary | ICD-10-CM | POA: Diagnosis not present

## 2017-10-11 SURGERY — POSTERIOR LUMBAR FUSION 1 LEVEL
Anesthesia: General | Site: Back

## 2017-10-11 MED ORDER — HYDROMORPHONE HCL 1 MG/ML IJ SOLN
0.2500 mg | INTRAMUSCULAR | Status: DC | PRN
  Administered 2017-10-12: 0.5 mg via INTRAVENOUS

## 2017-10-11 MED ORDER — PROPOFOL 10 MG/ML IV BOLUS
INTRAVENOUS | Status: DC | PRN
  Administered 2017-10-11: 200 mg via INTRAVENOUS

## 2017-10-11 MED ORDER — PROPOFOL 10 MG/ML IV BOLUS
INTRAVENOUS | Status: AC
  Filled 2017-10-11: qty 20

## 2017-10-11 MED ORDER — DIPHENHYDRAMINE HCL 50 MG/ML IJ SOLN
INTRAMUSCULAR | Status: AC
  Filled 2017-10-11: qty 1

## 2017-10-11 MED ORDER — DEXAMETHASONE SODIUM PHOSPHATE 10 MG/ML IJ SOLN
INTRAMUSCULAR | Status: AC
  Filled 2017-10-11: qty 1

## 2017-10-11 MED ORDER — MIDAZOLAM HCL 2 MG/2ML IJ SOLN
INTRAMUSCULAR | Status: DC | PRN
  Administered 2017-10-11: 2 mg via INTRAVENOUS

## 2017-10-11 MED ORDER — ROCURONIUM BROMIDE 10 MG/ML (PF) SYRINGE
PREFILLED_SYRINGE | INTRAVENOUS | Status: AC
  Filled 2017-10-11: qty 20

## 2017-10-11 MED ORDER — FENTANYL CITRATE (PF) 250 MCG/5ML IJ SOLN
INTRAMUSCULAR | Status: AC
  Filled 2017-10-11: qty 5

## 2017-10-11 MED ORDER — BACITRACIN ZINC 500 UNIT/GM EX OINT
TOPICAL_OINTMENT | CUTANEOUS | Status: DC | PRN
  Administered 2017-10-11: 1 via TOPICAL

## 2017-10-11 MED ORDER — THROMBIN (RECOMBINANT) 20000 UNITS EX SOLR
CUTANEOUS | Status: AC
  Filled 2017-10-11: qty 20000

## 2017-10-11 MED ORDER — 0.9 % SODIUM CHLORIDE (POUR BTL) OPTIME
TOPICAL | Status: DC | PRN
  Administered 2017-10-11: 1000 mL

## 2017-10-11 MED ORDER — FENTANYL CITRATE (PF) 250 MCG/5ML IJ SOLN
INTRAMUSCULAR | Status: DC | PRN
  Administered 2017-10-11: 100 ug via INTRAVENOUS
  Administered 2017-10-11 (×3): 50 ug via INTRAVENOUS

## 2017-10-11 MED ORDER — SCOPOLAMINE 1 MG/3DAYS TD PT72
MEDICATED_PATCH | TRANSDERMAL | Status: DC | PRN
  Administered 2017-10-11: 1 via TRANSDERMAL

## 2017-10-11 MED ORDER — CEFAZOLIN SODIUM-DEXTROSE 2-4 GM/100ML-% IV SOLN
2.0000 g | INTRAVENOUS | Status: AC
  Administered 2017-10-11: 2 g via INTRAVENOUS
  Filled 2017-10-11: qty 100

## 2017-10-11 MED ORDER — CHLORHEXIDINE GLUCONATE CLOTH 2 % EX PADS: 6.0000 | MEDICATED_PAD | Freq: Once | CUTANEOUS | Status: DC

## 2017-10-11 MED ORDER — PROPOFOL 1000 MG/100ML IV EMUL
INTRAVENOUS | Status: AC
  Filled 2017-10-11: qty 100

## 2017-10-11 MED ORDER — THROMBIN 20000 UNITS EX SOLR
CUTANEOUS | Status: DC | PRN
  Administered 2017-10-11: 20 mL via TOPICAL

## 2017-10-11 MED ORDER — ONDANSETRON HCL 4 MG/2ML IJ SOLN
INTRAMUSCULAR | Status: DC | PRN
  Administered 2017-10-11: 4 mg via INTRAVENOUS

## 2017-10-11 MED ORDER — LIDOCAINE-EPINEPHRINE 0.5 %-1:200000 IJ SOLN
INTRAMUSCULAR | Status: AC
  Filled 2017-10-11: qty 1

## 2017-10-11 MED ORDER — LIDOCAINE 2% (20 MG/ML) 5 ML SYRINGE
INTRAMUSCULAR | Status: DC | PRN
  Administered 2017-10-11: 100 mg via INTRAVENOUS

## 2017-10-11 MED ORDER — BUPIVACAINE HCL (PF) 0.5 % IJ SOLN
INTRAMUSCULAR | Status: DC | PRN
  Administered 2017-10-11: 20 mL

## 2017-10-11 MED ORDER — ROCURONIUM BROMIDE 10 MG/ML (PF) SYRINGE
PREFILLED_SYRINGE | INTRAVENOUS | Status: DC | PRN
  Administered 2017-10-11 (×2): 20 mg via INTRAVENOUS
  Administered 2017-10-11: 60 mg via INTRAVENOUS
  Administered 2017-10-11: 40 mg via INTRAVENOUS

## 2017-10-11 MED ORDER — SUGAMMADEX SODIUM 500 MG/5ML IV SOLN
INTRAVENOUS | Status: AC
  Filled 2017-10-11: qty 5

## 2017-10-11 MED ORDER — CHLORHEXIDINE GLUCONATE CLOTH 2 % EX PADS
6.0000 | MEDICATED_PAD | Freq: Once | CUTANEOUS | Status: AC
  Administered 2017-10-11: 6 via TOPICAL

## 2017-10-11 MED ORDER — DEXAMETHASONE SODIUM PHOSPHATE 10 MG/ML IJ SOLN
INTRAMUSCULAR | Status: DC | PRN
  Administered 2017-10-11: 10 mg via INTRAVENOUS

## 2017-10-11 MED ORDER — BACITRACIN ZINC 500 UNIT/GM EX OINT
TOPICAL_OINTMENT | CUTANEOUS | Status: AC
  Filled 2017-10-11: qty 28.35

## 2017-10-11 MED ORDER — LACTATED RINGERS IV SOLN
INTRAVENOUS | Status: DC
  Administered 2017-10-11: 18:00:00 via INTRAVENOUS

## 2017-10-11 MED ORDER — OXYCODONE HCL 5 MG PO TABS: 5.0000 mg | ORAL_TABLET | Freq: Once | ORAL | Status: DC | PRN

## 2017-10-11 MED ORDER — SUCCINYLCHOLINE CHLORIDE 200 MG/10ML IV SOSY
PREFILLED_SYRINGE | INTRAVENOUS | Status: DC | PRN
  Administered 2017-10-11: 70 mg via INTRAVENOUS

## 2017-10-11 MED ORDER — SUCCINYLCHOLINE CHLORIDE 200 MG/10ML IV SOSY
PREFILLED_SYRINGE | INTRAVENOUS | Status: AC
  Filled 2017-10-11: qty 10

## 2017-10-11 MED ORDER — SUGAMMADEX SODIUM 200 MG/2ML IV SOLN
INTRAVENOUS | Status: DC | PRN
  Administered 2017-10-11: 200 mg via INTRAVENOUS

## 2017-10-11 MED ORDER — BUPIVACAINE HCL (PF) 0.5 % IJ SOLN
INTRAMUSCULAR | Status: AC
  Filled 2017-10-11: qty 30

## 2017-10-11 MED ORDER — PROPOFOL 500 MG/50ML IV EMUL
INTRAVENOUS | Status: DC | PRN
  Administered 2017-10-11: 100 ug/kg/min via INTRAVENOUS

## 2017-10-11 MED ORDER — OXYCODONE HCL 5 MG/5ML PO SOLN
5.0000 mg | Freq: Once | ORAL | Status: DC | PRN
Start: 2017-10-11 — End: 2017-10-12

## 2017-10-11 MED ORDER — LACTATED RINGERS IV SOLN
INTRAVENOUS | Status: DC | PRN
  Administered 2017-10-11 (×3): via INTRAVENOUS

## 2017-10-11 MED ORDER — PROPOFOL 1000 MG/100ML IV EMUL
INTRAVENOUS | Status: AC
  Filled 2017-10-11: qty 300

## 2017-10-11 MED ORDER — LIDOCAINE 2% (20 MG/ML) 5 ML SYRINGE
INTRAMUSCULAR | Status: AC
  Filled 2017-10-11: qty 5

## 2017-10-11 MED ORDER — DIPHENHYDRAMINE HCL 50 MG/ML IJ SOLN
INTRAMUSCULAR | Status: DC | PRN
  Administered 2017-10-11: 25 mg via INTRAVENOUS

## 2017-10-11 MED ORDER — MIDAZOLAM HCL 2 MG/2ML IJ SOLN
INTRAMUSCULAR | Status: AC
  Filled 2017-10-11: qty 2

## 2017-10-11 MED ORDER — ONDANSETRON HCL 4 MG/2ML IJ SOLN
INTRAMUSCULAR | Status: AC
  Filled 2017-10-11: qty 2

## 2017-10-11 SURGICAL SUPPLY — 79 items
ADH SKN CLS APL DERMABOND .7 (GAUZE/BANDAGES/DRESSINGS) ×1
APL SKNCLS STERI-STRIP NONHPOA (GAUZE/BANDAGES/DRESSINGS)
BENZOIN TINCTURE PRP APPL 2/3 (GAUZE/BANDAGES/DRESSINGS) IMPLANT
BIT DRILL PLIF MAS DISP 5.5MM (DRILL) IMPLANT
BLADE CLIPPER SURG (BLADE) IMPLANT
BONE VIVIGEN FORMABLE 5.4CC (Bone Implant) ×3 IMPLANT
BUR MATCHSTICK NEURO 3.0 LAGG (BURR) ×3 IMPLANT
BUR PRECISION FLUTE 5.0 (BURR) ×3 IMPLANT
CAGE POST IBF 11X8D 26/9 (Cage) ×4 IMPLANT
CANISTER SUCT 3000ML PPV (MISCELLANEOUS) ×3 IMPLANT
CARTRIDGE OIL MAESTRO DRILL (MISCELLANEOUS) ×1 IMPLANT
CLOSURE WOUND 1/2 X4 (GAUZE/BANDAGES/DRESSINGS)
CONT SPEC 4OZ CLIKSEAL STRL BL (MISCELLANEOUS) ×5 IMPLANT
COVER BACK TABLE 60X90IN (DRAPES) ×3 IMPLANT
DECANTER SPIKE VIAL GLASS SM (MISCELLANEOUS) ×3 IMPLANT
DERMABOND ADVANCED (GAUZE/BANDAGES/DRESSINGS) ×2
DERMABOND ADVANCED .7 DNX12 (GAUZE/BANDAGES/DRESSINGS) ×1 IMPLANT
DIFFUSER DRILL AIR PNEUMATIC (MISCELLANEOUS) ×3 IMPLANT
DRAPE C-ARM 42X72 X-RAY (DRAPES) ×6 IMPLANT
DRAPE C-ARMOR (DRAPES) IMPLANT
DRAPE LAPAROTOMY 100X72X124 (DRAPES) ×3 IMPLANT
DRAPE POUCH INSTRU U-SHP 10X18 (DRAPES) ×3 IMPLANT
DRAPE SURG 17X23 STRL (DRAPES) ×3 IMPLANT
DRILL PLIF MAS DISP 5.5MM (DRILL) ×3
DRSG OPSITE POSTOP 4X6 (GAUZE/BANDAGES/DRESSINGS) ×2 IMPLANT
DURAPREP 26ML APPLICATOR (WOUND CARE) ×3 IMPLANT
ELECT BLADE 4.0 EZ CLEAN MEGAD (MISCELLANEOUS) ×3
ELECT REM PT RETURN 9FT ADLT (ELECTROSURGICAL) ×3
ELECTRODE BLDE 4.0 EZ CLN MEGD (MISCELLANEOUS) IMPLANT
ELECTRODE REM PT RTRN 9FT ADLT (ELECTROSURGICAL) ×1 IMPLANT
GAUZE SPONGE 4X4 12PLY STRL (GAUZE/BANDAGES/DRESSINGS) IMPLANT
GAUZE SPONGE 4X4 16PLY XRAY LF (GAUZE/BANDAGES/DRESSINGS) IMPLANT
GLOVE BIO SURGEON STRL SZ7 (GLOVE) ×4 IMPLANT
GLOVE BIOGEL PI IND STRL 7.5 (GLOVE) IMPLANT
GLOVE BIOGEL PI IND STRL 8 (GLOVE) IMPLANT
GLOVE BIOGEL PI INDICATOR 7.5 (GLOVE) ×2
GLOVE BIOGEL PI INDICATOR 8 (GLOVE) ×2
GLOVE ECLIPSE 6.5 STRL STRAW (GLOVE) ×6 IMPLANT
GLOVE ECLIPSE 7.5 STRL STRAW (GLOVE) ×2 IMPLANT
GLOVE EXAM NITRILE LRG STRL (GLOVE) IMPLANT
GLOVE EXAM NITRILE XL STR (GLOVE) IMPLANT
GLOVE EXAM NITRILE XS STR PU (GLOVE) IMPLANT
GOWN STRL REUS W/ TWL LRG LVL3 (GOWN DISPOSABLE) ×2 IMPLANT
GOWN STRL REUS W/ TWL XL LVL3 (GOWN DISPOSABLE) IMPLANT
GOWN STRL REUS W/TWL 2XL LVL3 (GOWN DISPOSABLE) ×2 IMPLANT
GOWN STRL REUS W/TWL LRG LVL3 (GOWN DISPOSABLE) ×9
GOWN STRL REUS W/TWL XL LVL3 (GOWN DISPOSABLE)
GRAFT BNE MATRIX VG FRMBL MD 5 (Bone Implant) IMPLANT
KIT BASIN OR (CUSTOM PROCEDURE TRAY) ×3 IMPLANT
KIT POSITION SURG JACKSON T1 (MISCELLANEOUS) ×3 IMPLANT
KIT TURNOVER KIT B (KITS) ×3 IMPLANT
MILL MEDIUM DISP (BLADE) ×2 IMPLANT
NDL HYPO 25X1 1.5 SAFETY (NEEDLE) ×1 IMPLANT
NDL SPNL 18GX3.5 QUINCKE PK (NEEDLE) IMPLANT
NEEDLE HYPO 25X1 1.5 SAFETY (NEEDLE) ×3 IMPLANT
NEEDLE SPNL 18GX3.5 QUINCKE PK (NEEDLE) IMPLANT
NS IRRIG 1000ML POUR BTL (IV SOLUTION) ×3 IMPLANT
OIL CARTRIDGE MAESTRO DRILL (MISCELLANEOUS) ×3
PACK LAMINECTOMY NEURO (CUSTOM PROCEDURE TRAY) ×3 IMPLANT
PAD ARMBOARD 7.5X6 YLW CONV (MISCELLANEOUS) ×6 IMPLANT
ROD 50MM LUMBAR (Rod) ×2 IMPLANT
ROD 60MM LUMBAR (Rod) ×2 IMPLANT
SCREW LOCK (Screw) ×12 IMPLANT
SCREW LOCK FXNS SPNE MAS PL (Screw) IMPLANT
SCREW SHANKS 5.5X35 (Screw) ×4 IMPLANT
SCREW TULIP 5.5 (Screw) ×4 IMPLANT
SPONGE LAP 4X18 RFD (DISPOSABLE) IMPLANT
SPONGE SURGIFOAM ABS GEL 100 (HEMOSTASIS) ×3 IMPLANT
STRIP CLOSURE SKIN 1/2X4 (GAUZE/BANDAGES/DRESSINGS) IMPLANT
SUT PROLENE 6 0 BV (SUTURE) IMPLANT
SUT VIC AB 0 CT1 18XCR BRD8 (SUTURE) ×1 IMPLANT
SUT VIC AB 0 CT1 8-18 (SUTURE) ×6
SUT VIC AB 2-0 CT1 18 (SUTURE) ×3 IMPLANT
SUT VIC AB 3-0 SH 8-18 (SUTURE) ×3 IMPLANT
SYR CONTROL 10ML LL (SYRINGE) ×2 IMPLANT
TOWEL GREEN STERILE (TOWEL DISPOSABLE) ×3 IMPLANT
TOWEL GREEN STERILE FF (TOWEL DISPOSABLE) ×3 IMPLANT
TRAY FOLEY MTR SLVR 16FR STAT (SET/KITS/TRAYS/PACK) ×3 IMPLANT
WATER STERILE IRR 1000ML POUR (IV SOLUTION) ×3 IMPLANT

## 2017-10-11 NOTE — Op Note (Signed)
10/11/2017  11:05 PM  PATIENT:  Troy Franklin  47 y.o. male whom yesterday underwent a semihemilaminectomy of L3 for stenosis, and spondylolisthesis. His insurance carrier approved a fusion during the case. I was not notified of this, and proceeded with the simple decompression for stenosis operation which was approved. He is brought back today for the desired PLIF.  PRE-OPERATIVE DIAGNOSIS:  Spondylolisthesis, Lumbar region l3/4 POST-OPERATIVE DIAGNOSIS:  Spondylolisthesis, Lumbar region l3/4PROCEDURE:  Procedure(s): Lumbar 3-4 Posterior lumbar interbody fusion, l3/4. , autograft morsels, Ti cages 11x105mm Laminectomy L3, complete, beyond the needed exposure for a plif.   SURGEON:  Surgeon(s): Ashok Pall, MD Jovita Gamma, MD  ASSISTANTS:Nudelman, Herbie Baltimore  ANESTHESIA:   general  EBL:  Total I/O In: 2000 [I.V.:2000] Out: 299 [Urine:175; Blood:600]  BLOOD ADMINISTERED:none   COUNT:per nursing  DRAINS: none   SPECIMEN:  No Specimen  DICTATION: [name] is a 47 y.o. male whom was taken to the operating room intubated, and placed under a general anesthetic without difficulty. A foley catheter was placed under sterile conditions. He was positioned prone on a Jackson stable with all pressure points properly padded.  HIs lumbar region was prepped and draped in a sterile manner. I opened the skin with a 10 blade and took the incision down to the thoracolumbar fascia. I exposed the lamina of L3, and L4 in a subperiosteal fashion bilaterally. I confirmed my location with identification of my operative site from 10/10/17.  I placed self retaining retractors and started the decompression.  We decompressed the spinal canal via completion laminectomy of L3. We then performed inferior facetectomies of L3 bilaterally, well beyond the necessary exposure for a PLIF. I removed bone until both L3 roots were free of any compression in the canal, foramen, and extraforaminal space. PLIF's were  performed at L3/4 in the same fashion. I opened the disc space with a 15 blade then used a variety of instruments to remove the disc and prepare the space for the arthrodesis. I used curettes, rongeurs, punches, shavers for the disc space, and rasps in the discetomy. I measured the disc space and placed 11x66mm Ti cages(synthes) into the disc space(s). The cages and disc space had auto and allograft morsels placed inside.   I placed pedicle screws at L3, using fluoroscopic guidance. I drilled a pilot hole, then cannulated the pedicle with a drill at each site. I then tapped each pedicle, assessing each site for pedicle violations. No cutouts were appreciated. Screws (Nuvasive masplif) were then placed at each site without difficulty. I attached rods and locking caps with the appropriate tools. The locking caps were secured with torque limited screwdrivers. Final films were performed and the final construct appeared to be in good position.  I closed the wound in a layered fashion. I approximated the thoracolumbar fascia, subcutaneous, and subcuticular planes with vicryl sutures. I used dermabond and an occlusive bandage for a sterile dressing.     PLAN OF CARE: Admit to inpatient   PATIENT DISPOSITION:  PACU - hemodynamically stable.   Delay start of Pharmacological VTE agent (>24hrs) due to surgical blood loss or risk of bleeding:  yes

## 2017-10-11 NOTE — Anesthesia Procedure Notes (Signed)
Procedure Name: Intubation Date/Time: 10/11/2017 7:12 PM Performed by: Teressa Lower., CRNA Pre-anesthesia Checklist: Patient identified, Emergency Drugs available, Suction available and Patient being monitored Patient Re-evaluated:Patient Re-evaluated prior to induction Oxygen Delivery Method: Circle system utilized Preoxygenation: Pre-oxygenation with 100% oxygen Induction Type: IV induction Ventilation: Mask ventilation without difficulty and Oral airway inserted - appropriate to patient size Laryngoscope Size: Glidescope and 4 Grade View: Grade I Tube type: Oral Tube size: 7.5 mm Number of attempts: 1 Airway Equipment and Method: Stylet,  Oral airway and Video-laryngoscopy Placement Confirmation: ETT inserted through vocal cords under direct vision,  positive ETCO2 and breath sounds checked- equal and bilateral Secured at: 25 cm Tube secured with: Tape Dental Injury: Teeth and Oropharynx as per pre-operative assessment  Difficulty Due To: Difficulty was anticipated, Difficult Airway- due to large tongue and Difficult Airway- due to limited oral opening Future Recommendations: Recommend- induction with short-acting agent, and alternative techniques readily available

## 2017-10-11 NOTE — Transfer of Care (Signed)
Immediate Anesthesia Transfer of Care Note  Patient: Troy Franklin  Procedure(s) Performed: Lumbar 3-4 Posterior lumbar interbody fusion (N/A Back)  Patient Location: PACU  Anesthesia Type:General  Level of Consciousness: sedated, patient cooperative and responds to stimulation  Airway & Oxygen Therapy: Patient Spontanous Breathing and Patient connected to nasal cannula oxygen  Post-op Assessment: Report given to RN, Post -op Vital signs reviewed and stable and Patient moving all extremities X 4  Post vital signs: Reviewed and stable  Last Vitals:  Vitals Value Taken Time  BP 117/81 10/11/2017 11:18 PM  Temp    Pulse    Resp 15 10/11/2017 11:21 PM  SpO2    Vitals shown include unvalidated device data.  Last Pain:  Vitals:   10/11/17 1627  TempSrc: Oral  PainSc:       Patients Stated Pain Goal: 3 (99/27/80 0447)  Complications: No apparent anesthesia complications

## 2017-10-11 NOTE — Addendum Note (Signed)
Addendum  created 10/11/17 1502 by Albertha Ghee, MD   Intraprocedure Event edited, Intraprocedure Staff edited

## 2017-10-11 NOTE — Progress Notes (Signed)
RN received a call from OR concerning the last time pt had something to eat or drink. After clarification and confirmation with pt and person from the OR; pt placed on NPO per verbal order from Dr. Christella Noa. Pt voices understanding and agreement. Delia Heady RN

## 2017-10-11 NOTE — Anesthesia Postprocedure Evaluation (Signed)
Anesthesia Post Note  Patient: Troy Franklin  Procedure(s) Performed: Lumbar Three-Four Laminectomy (N/A Spine Lumbar)     Patient location during evaluation: PACU Anesthesia Type: General Level of consciousness: awake and alert Pain management: pain level controlled Vital Signs Assessment: post-procedure vital signs reviewed and stable Respiratory status: spontaneous breathing, nonlabored ventilation, respiratory function stable and patient connected to nasal cannula oxygen Cardiovascular status: blood pressure returned to baseline and stable Postop Assessment: no apparent nausea or vomiting Anesthetic complications: no    Last Vitals:  Vitals:   10/11/17 0318 10/11/17 0834  BP: 102/60 118/66  Pulse: 73 73  Resp:  20  Temp: 36.5 C 36.9 C  SpO2: 97% 98%    Last Pain:  Vitals:   10/11/17 0834  TempSrc: Oral  PainSc:                  [name]

## 2017-10-11 NOTE — Anesthesia Preprocedure Evaluation (Signed)
Anesthesia Evaluation  Patient identified by MRN, date of birth, ID band Patient awake    Reviewed: Allergy & Precautions, H&P , NPO status , Patient's Chart, lab work & pertinent test results  History of Anesthesia Complications (+) PONV and history of anesthetic complications  Airway Mallampati: IV  TM Distance: >3 FB Neck ROM: full    Dental  (+) Teeth Intact   Pulmonary sleep apnea , Current Smoker,    breath sounds clear to auscultation       Cardiovascular hypertension,  Rhythm:regular Rate:Normal     Neuro/Psych  Headaches,    GI/Hepatic Neg liver ROS, GERD  ,  Endo/Other  Morbid obesity  Renal/GU negative Renal ROS     Musculoskeletal  (+) Arthritis ,   Abdominal   Peds  Hematology negative hematology ROS (+)   Anesthesia Other Findings   Reproductive/Obstetrics                             Anesthesia Physical Anesthesia Plan  ASA: II  Anesthesia Plan: General   Post-op Pain Management:    Induction: Intravenous  PONV Risk Score and Plan: 2 and Ondansetron, Dexamethasone, Propofol infusion and Scopolamine patch - Pre-op  Airway Management Planned: Oral ETT  Additional Equipment: None  Intra-op Plan:   Post-operative Plan: Extubation in OR  Informed Consent: I have reviewed the patients History and Physical, chart, labs and discussed the procedure including the risks, benefits and alternatives for the proposed anesthesia with the patient or authorized representative who has indicated his/her understanding and acceptance.   Dental advisory given  Plan Discussed with: CRNA and Surgeon  Anesthesia Plan Comments:         Anesthesia Quick Evaluation

## 2017-10-11 NOTE — Progress Notes (Signed)
Patient ID: Troy Franklin, male   DOB: [birth year], 47 y.o.   MRN: [id number] BP (!) 113/56 (BP Location: Left Arm)   Pulse 62   Temp 98 F (36.7 C) (Oral)   Resp 20   Ht 5\' 8"  (1.727 m)   Wt 101.6 kg (224 lb)   SpO2 100%   BMI 34.06 kg/m  Mr. Horseman's insurance company approved a lumbar arthrodesis. Thus I will take him back today for the completion of the decompression he underwent yesterday. At that time he did not have approval, and could only undergo a lumbar laminectomy. He is in full agreement with this plan. Risks and benefits including but not limited too bleeding, infection, need for further surgery, fusion failure, hardware failure, no pain relief, nerve damage, bowel and or bladder dysfunction were discussed. Normal strength in lower extremities.

## 2017-10-11 NOTE — Progress Notes (Signed)
Report called off to short stay RN Horris Latino; pt transported off unit to OR. CHG bath completed. Delia Heady RN

## 2017-10-12 MED ORDER — HYDROMORPHONE HCL 1 MG/ML IJ SOLN
INTRAMUSCULAR | Status: AC
  Filled 2017-10-12: qty 1

## 2017-10-12 MED ORDER — KETOROLAC TROMETHAMINE 30 MG/ML IJ SOLN
30.0000 mg | Freq: Four times a day (QID) | INTRAMUSCULAR | Status: DC
  Administered 2017-10-12 – 2017-10-13 (×6): 30 mg via INTRAVENOUS
  Filled 2017-10-12 (×7): qty 1

## 2017-10-12 MED FILL — Thrombin (Recombinant) For Soln 20000 Unit: CUTANEOUS | Qty: 1 | Status: AC

## 2017-10-12 NOTE — Progress Notes (Signed)
Pt voided 112ml in urinal; pt OOB with Rn assistance; ambulated 150ft with a walker around the circle on the unit. Back incision remains unremarkable. Pt back to bed with call light within reach and spouse remains at bedside. Delia Heady RN

## 2017-10-12 NOTE — Anesthesia Postprocedure Evaluation (Signed)
Anesthesia Post Note  Patient: Troy Franklin  Procedure(s) Performed: Lumbar 3-4 Posterior lumbar interbody fusion (N/A Back)     Patient location during evaluation: PACU Anesthesia Type: General Level of consciousness: awake and alert Pain management: pain level controlled Vital Signs Assessment: post-procedure vital signs reviewed and stable Respiratory status: spontaneous breathing, nonlabored ventilation, respiratory function stable and patient connected to nasal cannula oxygen Cardiovascular status: blood pressure returned to baseline and stable Postop Assessment: no apparent nausea or vomiting Anesthetic complications: no    Last Vitals:  Vitals:   10/12/17 0106 10/12/17 0453  BP: 129/67 111/60  Pulse: 67 68  Resp: 13 13  Temp: 37.1 C 36.7 C  SpO2: 97% 96%    Last Pain:  Vitals:   10/12/17 0453  TempSrc: Oral  PainSc:                  Jordin Dambrosio

## 2017-10-12 NOTE — Progress Notes (Signed)
Pt requested for foley to be removed; pt informed there's order to continue foley but insisted for it to be removed stating "it will be coming out anyway"; foley removed and 667ml emptied from bag. Will continue to closely monitor pt. Delia Heady RN

## 2017-10-12 NOTE — Progress Notes (Signed)
Pt ambulated x3 around circle on unit. Tolerated well and denies any discomfort. Delia Heady RN

## 2017-10-12 NOTE — Progress Notes (Signed)
Patient ID: Troy Franklin, male   DOB: [birth year], 47 y.o.   MRN: [id number] BP 125/60 (BP Location: Left Arm)   Pulse 80   Temp 98.6 F (37 C) (Oral)   Resp 18   Ht 5\' 8"  (1.727 m)   Wt 101.6 kg (224 lb)   SpO2 98%   BMI 34.06 kg/m  Alert and oriented x4. Speech is clear and fluent Moving well  Wound is clean, dry, no signs of infection Doing much better.

## 2017-10-13 MED ORDER — DIAZEPAM 5 MG PO TABS: 5.0000 mg | ORAL_TABLET | Freq: Four times a day (QID) | ORAL | 0 refills | Status: DC | PRN

## 2017-10-13 MED ORDER — SENNOSIDES-DOCUSATE SODIUM 8.6-50 MG PO TABS
1.0000 | ORAL_TABLET | Freq: Two times a day (BID) | ORAL | Status: DC
  Administered 2017-10-13: 1 via ORAL
  Filled 2017-10-13: qty 1

## 2017-10-13 MED ORDER — OXYCODONE HCL 5 MG PO TABS: 5.0000 mg | ORAL_TABLET | ORAL | 0 refills | Status: DC | PRN

## 2017-10-13 NOTE — Plan of Care (Signed)
  Problem: Education: Goal: Knowledge of the prescribed therapeutic regimen will improve Outcome: Progressing   Problem: Education: Goal: Ability to verbalize activity precautions or restrictions will improve Outcome: Progressing   Problem: Safety: Goal: Ability to remain free from injury will improve Outcome: Progressing   Problem: Clinical Measurements: Goal: Ability to maintain clinical measurements within normal limits will improve Outcome: Progressing

## 2017-10-13 NOTE — Discharge Summary (Signed)
Physician Discharge Summary  Patient ID: Troy Franklin MRN: [id number] DOB/AGE: [birth year] 47 y.o.  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  At the time of his admission his insurance company would only improve for a decompression and in light of the pain and discomfort he was having he underwent the surgical intervention during that time insurance company returned and approval for his arthrodesis however it was after the time of the patient's surgery.  He was returned to the operating room the following day to undergo the arthrodesis that was initially requested.  He tolerated that surgery well and has had good relief of his leg pain.  His incision is clean and dry and his motor function appears intact.  Discharged home  Consults: None  Significant Diagnostic Studies: None  Treatments: surgery: Decompression L3-L4.  Subsequent posterior lumbar interbody arthrodesis L3-L4.  Discharge Exam: Blood pressure (!) 109/57, pulse 61, temperature 98.5 F (36.9 C), temperature source Oral, resp. rate 17, height 5\' 8"  (1.727 m), weight 101.6 kg (224 lb), SpO2 97 %. Incision is clean and dry.  Motor function appears intact.  Disposition: Discharge disposition: 01-Home or Self Care       Discharge Instructions    Call MD for:  redness, tenderness, or signs of infection (pain, swelling, redness, odor or green/yellow discharge around incision site)   Complete by:  As directed    Call MD for:  severe uncontrolled pain   Complete by:  As directed    Call MD for:  temperature >100.4   Complete by:  As directed    Diet -  low sodium heart healthy   Complete by:  As directed    Increase activity slowly   Complete by:  As directed      Allergies as of 10/13/2017      Reactions   Codeine Anaphylaxis   Onion Other (See Comments)   Migraines      Medication List    STOP taking these medications   meloxicam 15 MG tablet Commonly known as:  MOBIC     TAKE these medications   diazepam 5 MG tablet Commonly known as:  VALIUM Take 1 tablet (5 mg total) by mouth every 6 (six) hours as needed for muscle spasms. What changed:    when to take this  reasons to take this   omeprazole 20 MG capsule Commonly known as:  PRILOSEC Take 20 mg by mouth daily.   oxyCODONE 5 MG immediate release tablet Commonly known as:  Oxy IR/ROXICODONE Take 1-2 tablets (5-10 mg total) by mouth every 4 (four) hours as needed for severe pain. What changed:  reasons to take this   telmisartan-hydrochlorothiazide 40-12.5 MG tablet Commonly known as:  MICARDIS HCT Take 1 tablet by mouth daily.        SignedEarleen Newport 10/13/2017, 11:27 AM

## 2017-12-28 ENCOUNTER — Other Ambulatory Visit: Payer: Self-pay | Admitting: Neurological Surgery

## 2017-12-28 DIAGNOSIS — M4726 Other spondylosis with radiculopathy, lumbar region: Secondary | ICD-10-CM

## 2018-01-05 ENCOUNTER — Other Ambulatory Visit: Payer: BLUE CROSS/BLUE SHIELD

## 2018-01-06 ENCOUNTER — Ambulatory Visit
Admission: RE | Admit: 2018-01-06 | Discharge: 2018-01-06 | Disposition: A | Discharge status: Home / Self Care | Payer: BLUE CROSS/BLUE SHIELD | Source: Ambulatory Visit | Attending: Neurological Surgery | Admitting: Neurological Surgery

## 2018-01-06 DIAGNOSIS — M4726 Other spondylosis with radiculopathy, lumbar region: Secondary | ICD-10-CM

## 2018-01-06 MED ORDER — GADOBENATE DIMEGLUMINE 529 MG/ML IV SOLN
20.0000 mL | Freq: Once | INTRAVENOUS | Status: AC | PRN
  Administered 2018-01-06: 20 mL via INTRAVENOUS

## 2018-01-18 ENCOUNTER — Other Ambulatory Visit: Payer: Self-pay | Admitting: Neurosurgery

## 2018-02-05 ENCOUNTER — Other Ambulatory Visit (HOSPITAL_COMMUNITY): Payer: BLUE CROSS/BLUE SHIELD

## 2018-02-05 ENCOUNTER — Inpatient Hospital Stay (HOSPITAL_COMMUNITY): Admission: RE | Admit: 2018-02-05 | Payer: BLUE CROSS/BLUE SHIELD | Source: Ambulatory Visit

## 2018-02-13 ENCOUNTER — Encounter (HOSPITAL_COMMUNITY): Admission: RE | Payer: Self-pay | Source: Ambulatory Visit

## 2018-02-13 ENCOUNTER — Inpatient Hospital Stay (HOSPITAL_COMMUNITY): Admission: RE | Admit: 2018-02-13 | Payer: BLUE CROSS/BLUE SHIELD | Source: Ambulatory Visit | Admitting: Neurosurgery

## 2018-02-13 SURGERY — POSTERIOR LUMBAR FUSION 1 LEVEL
Anesthesia: General

## 2019-03-20 ENCOUNTER — Ambulatory Visit: Payer: Self-pay | Attending: Internal Medicine

## 2019-03-20 ENCOUNTER — Other Ambulatory Visit: Payer: Self-pay

## 2019-03-20 DIAGNOSIS — Z20822 Contact with and (suspected) exposure to covid-19: Secondary | ICD-10-CM | POA: Insufficient documentation

## 2019-03-21 ENCOUNTER — Other Ambulatory Visit: Payer: BLUE CROSS/BLUE SHIELD

## 2019-03-22 LAB — NOVEL CORONAVIRUS, NAA: SARS-CoV-2, NAA: NOT DETECTED

## 2019-07-23 ENCOUNTER — Other Ambulatory Visit: Payer: Self-pay | Admitting: Neurosurgery

## 2019-07-23 DIAGNOSIS — M4316 Spondylolisthesis, lumbar region: Secondary | ICD-10-CM

## 2019-08-23 ENCOUNTER — Other Ambulatory Visit: Payer: Self-pay

## 2019-08-23 ENCOUNTER — Ambulatory Visit
Admission: RE | Admit: 2019-08-23 | Discharge: 2019-08-23 | Disposition: A | Discharge status: Home / Self Care | Payer: Managed Care, Other (non HMO) | Source: Ambulatory Visit | Attending: Neurosurgery | Admitting: Neurosurgery

## 2019-08-23 DIAGNOSIS — M4316 Spondylolisthesis, lumbar region: Secondary | ICD-10-CM

## 2019-08-23 MED ORDER — GADOBENATE DIMEGLUMINE 529 MG/ML IV SOLN
20.0000 mL | Freq: Once | INTRAVENOUS | Status: AC | PRN
  Administered 2019-08-23: 20 mL via INTRAVENOUS

## 2019-08-25 ENCOUNTER — Other Ambulatory Visit: Payer: Self-pay | Admitting: Neurosurgery

## 2019-09-16 ENCOUNTER — Telehealth (HOSPITAL_COMMUNITY): Payer: Self-pay | Admitting: Physical Therapy

## 2019-09-16 NOTE — Telephone Encounter (Signed)
Pt called stating they approved his surgery and he request to close this referral.

## 2019-09-17 ENCOUNTER — Other Ambulatory Visit (HOSPITAL_COMMUNITY)
Admission: RE | Admit: 2019-09-17 | Discharge: 2019-09-17 | Disposition: A | Discharge status: Home / Self Care | Payer: Managed Care, Other (non HMO) | Source: Ambulatory Visit | Attending: Neurosurgery | Admitting: Neurosurgery

## 2019-09-17 ENCOUNTER — Encounter (HOSPITAL_COMMUNITY): Payer: Self-pay

## 2019-09-17 ENCOUNTER — Encounter (HOSPITAL_COMMUNITY)
Admission: RE | Admit: 2019-09-17 | Discharge: 2019-09-17 | Disposition: A | Discharge status: Home / Self Care | Payer: Managed Care, Other (non HMO) | Source: Ambulatory Visit | Attending: Neurosurgery | Admitting: Neurosurgery

## 2019-09-17 ENCOUNTER — Other Ambulatory Visit: Payer: Self-pay

## 2019-09-17 DIAGNOSIS — Z01818 Encounter for other preprocedural examination: Secondary | ICD-10-CM | POA: Insufficient documentation

## 2019-09-17 DIAGNOSIS — Z20822 Contact with and (suspected) exposure to covid-19: Secondary | ICD-10-CM | POA: Insufficient documentation

## 2019-09-17 LAB — CBC
HCT: 40.7 % (ref 39.0–52.0)
Hemoglobin: 14.2 g/dL (ref 13.0–17.0)
MCH: 31.7 pg (ref 26.0–34.0)
MCHC: 34.9 g/dL (ref 30.0–36.0)
MCV: 90.8 fL (ref 80.0–100.0)
Platelets: 166 10*3/uL (ref 150–400)
RBC: 4.48 MIL/uL (ref 4.22–5.81)
RDW: 11.9 % (ref 11.5–15.5)
WBC: 7.7 10*3/uL (ref 4.0–10.5)
nRBC: 0 % (ref 0.0–0.2)

## 2019-09-17 LAB — BASIC METABOLIC PANEL
Anion gap: 9 (ref 5–15)
BUN: 14 mg/dL (ref 6–20)
CO2: 25 mmol/L (ref 22–32)
Calcium: 9.1 mg/dL (ref 8.9–10.3)
Chloride: 105 mmol/L (ref 98–111)
Creatinine, Ser: 1.16 mg/dL (ref 0.61–1.24)
GFR calc Af Amer: >60 mL/min (ref >60)
GFR calc non Af Amer: >60 mL/min (ref >60)
Glucose, Bld: 103 mg/dL — ABNORMAL HIGH (ref 70–99)
Potassium: 3.8 mmol/L (ref 3.5–5.1)
Sodium: 139 mmol/L (ref 135–145)

## 2019-09-17 LAB — SURGICAL PCR SCREEN
MRSA, PCR: NEGATIVE
Staphylococcus aureus: NEGATIVE

## 2019-09-17 LAB — SARS CORONAVIRUS 2 (TAT 6-24 HRS): SARS Coronavirus 2: NEGATIVE

## 2019-09-17 LAB — TYPE AND SCREEN
ABO/RH(D): O POS
Antibody Screen: NEGATIVE

## 2019-09-17 NOTE — Progress Notes (Signed)
Your procedure is scheduled on Friday July 9.  Report to [facility] Main Entrance "A" at 06:30 A.M., and check in at the Admitting office.  Call this number if you have problems the morning of surgery: [phone]  Call [phone] if you have any questions prior to your surgery date Monday-Friday 8am-4pm   Remember: Do not eat or drink after midnight the night before your surgery   Take these medicines the morning of surgery with A SIP OF WATER: omeprazole (PRILOSEC)   As of today, STOP taking any meloxicam (MOBIC), Aspirin (unless otherwise instructed by your surgeon), Aleve, Naproxen, Ibuprofen, Motrin, Advil, Goody's, BC's, all herbal medications, fish oil, and all vitamins.    The Morning of Surgery  Do not wear jewelry  Do not wear lotions, powders, colognes, or deodorant   Men may shave face and neck.  Do not bring valuables to the hospital.  [facility] is not responsible for any belongings or valuables.  If you are a smoker, DO NOT Smoke 24 hours prior to surgery  If you wear a CPAP at night please bring your mask the morning of surgery   Remember that you must have someone to transport you home after your surgery, and remain with you for 24 hours if you are discharged the same day.   Please bring cases for contacts, glasses, hearing aids, dentures or bridgework because it cannot be worn into surgery.    Leave your suitcase in the car.  After surgery it may be brought to your room.  For patients admitted to the hospital, discharge time will be determined by your treatment team.  Patients discharged the day of surgery will not be allowed to drive home.    Special instructions:   Troy Franklin- Preparing For Surgery  Before surgery, you can play an important role. Because skin is not sterile, your skin needs to be as free of germs as possible. You can reduce the number of germs on your skin by washing with CHG (chlorahexidine gluconate) Soap before surgery.  CHG  is an antiseptic cleaner which kills germs and bonds with the skin to continue killing germs even after washing.    Oral Hygiene is also important to reduce your risk of infection.  Remember - BRUSH YOUR TEETH THE MORNING OF SURGERY WITH YOUR REGULAR TOOTHPASTE  Please do not use if you have an allergy to CHG or antibacterial soaps. If your skin becomes reddened/irritated stop using the CHG.  Do not shave (including legs and underarms) for at least 48 hours prior to first CHG shower. It is OK to shave your face.  Please follow these instructions carefully.   1. Shower the NIGHT BEFORE SURGERY and the MORNING OF SURGERY with CHG Soap.   2. If you chose to wash your hair and body, wash as usual with your normal shampoo and body-wash/soap.  3. Rinse your hair and body thoroughly to remove the shampoo and soap.  4. Apply CHG directly to the skin (ONLY FROM THE NECK DOWN) and wash gently with a scrungie or a clean washcloth.   5. Do not use on open wounds or open sores. Avoid contact with your eyes, ears, mouth and genitals (private parts). Wash Face and genitals (private parts)  with your normal soap.   6. Wash thoroughly, paying special attention to the area where your surgery will be performed.  7. Thoroughly rinse your body with warm water from the neck down.  8. DO NOT shower/wash with your  normal soap after using and rinsing off the CHG Soap.  9. Pat yourself dry with a CLEAN TOWEL.  10. Wear CLEAN PAJAMAS to bed the night before surgery  11. Place CLEAN SHEETS on your bed the night of your first shower and DO NOT SLEEP WITH PETS.  12. Wear comfortable clothes the morning of surgery.     Day of Surgery:  Please shower the morning of surgery with the CHG soap Do not apply any deodorants/lotions. Please wear clean clothes to the hospital/surgery center.   Remember to brush your teeth WITH YOUR REGULAR TOOTHPASTE.   Please read over the following fact sheets that you were  given.

## 2019-09-17 NOTE — Progress Notes (Signed)
PCP Baldemar Lenis, MD Cardiologist - pt denies   Chest x-ray - n/a EKG - 09/17/19 Stress Test - pt denies ECHO - pt denies Cardiac Cath - pt denies  Sleep Study - yes CPAP - yes setting: 12    Blood Thinner Instructions:n/a Aspirin Instructions:n/a   COVID TEST- 09/17/19  Coronavirus Screening  Have you experienced the following symptoms:  Cough yes/no: No Fever (>100.64F)  yes/no: No Runny nose yes/no: No Sore throat yes/no: No Difficulty breathing/shortness of breath  yes/no: No  Have you or a family member traveled in the last 14 days and where? yes/no: No   If the patient indicates "YES" to the above questions, their PAT will be rescheduled to limit the exposure to others and, the surgeon will be notified. THE PATIENT WILL NEED TO BE ASYMPTOMATIC FOR 14 DAYS.   If the patient is not experiencing any of these symptoms, the PAT nurse will instruct them to NOT bring anyone with them to their appointment since they may have these symptoms or traveled as well.   Please remind your patients and families that hospital visitation restrictions are in effect and the importance of the restrictions.     Anesthesia review: n/a  Patient denies shortness of breath, fever, cough and chest pain at PAT appointment   All instructions explained to the patient, with a verbal understanding of the material. Patient agrees to go over the instructions while at home for a better understanding. Patient also instructed to self quarantine after being tested for COVID-19. The opportunity to ask questions was provided.

## 2019-09-19 ENCOUNTER — Ambulatory Visit (HOSPITAL_COMMUNITY): Payer: Managed Care, Other (non HMO) | Admitting: Physical Therapy

## 2019-09-19 ENCOUNTER — Encounter (HOSPITAL_COMMUNITY): Payer: Self-pay | Admitting: Neurosurgery

## 2019-09-19 ENCOUNTER — Encounter (HOSPITAL_COMMUNITY)
Admission: RE | Disposition: A | Discharge status: Home / Self Care | Payer: Self-pay | Source: Home / Self Care | Attending: Neurosurgery

## 2019-09-19 ENCOUNTER — Inpatient Hospital Stay (HOSPITAL_COMMUNITY): Payer: Managed Care, Other (non HMO) | Admitting: Anesthesiology

## 2019-09-19 ENCOUNTER — Inpatient Hospital Stay (HOSPITAL_COMMUNITY)
Admission: RE | Admit: 2019-09-19 | Discharge: 2019-09-22 | DRG: 460 | Disposition: A | Discharge status: Home / Self Care | Payer: Managed Care, Other (non HMO) | Attending: Neurosurgery | Admitting: Neurosurgery

## 2019-09-19 ENCOUNTER — Encounter (HOSPITAL_COMMUNITY): Payer: Self-pay

## 2019-09-19 ENCOUNTER — Other Ambulatory Visit: Payer: Self-pay

## 2019-09-19 ENCOUNTER — Inpatient Hospital Stay (HOSPITAL_COMMUNITY): Payer: Managed Care, Other (non HMO)

## 2019-09-19 DIAGNOSIS — M47816 Spondylosis without myelopathy or radiculopathy, lumbar region: Secondary | ICD-10-CM | POA: Diagnosis present

## 2019-09-19 DIAGNOSIS — G4730 Sleep apnea, unspecified: Secondary | ICD-10-CM | POA: Diagnosis present

## 2019-09-19 DIAGNOSIS — I10 Essential (primary) hypertension: Secondary | ICD-10-CM | POA: Diagnosis present

## 2019-09-19 DIAGNOSIS — Z20822 Contact with and (suspected) exposure to covid-19: Secondary | ICD-10-CM | POA: Diagnosis present

## 2019-09-19 DIAGNOSIS — K219 Gastro-esophageal reflux disease without esophagitis: Secondary | ICD-10-CM | POA: Diagnosis present

## 2019-09-19 DIAGNOSIS — Z419 Encounter for procedure for purposes other than remedying health state, unspecified: Secondary | ICD-10-CM

## 2019-09-19 DIAGNOSIS — K5900 Constipation, unspecified: Secondary | ICD-10-CM | POA: Diagnosis not present

## 2019-09-19 DIAGNOSIS — M48061 Spinal stenosis, lumbar region without neurogenic claudication: Secondary | ICD-10-CM | POA: Diagnosis present

## 2019-09-19 DIAGNOSIS — Z87891 Personal history of nicotine dependence: Secondary | ICD-10-CM

## 2019-09-19 DIAGNOSIS — M4316 Spondylolisthesis, lumbar region: Secondary | ICD-10-CM | POA: Diagnosis present

## 2019-09-19 DIAGNOSIS — Z981 Arthrodesis status: Secondary | ICD-10-CM

## 2019-09-19 DIAGNOSIS — Z885 Allergy status to narcotic agent status: Secondary | ICD-10-CM

## 2019-09-19 SURGERY — POSTERIOR LUMBAR FUSION 1 LEVEL
Anesthesia: General | Site: Back

## 2019-09-19 MED ORDER — CHLORHEXIDINE GLUCONATE CLOTH 2 % EX PADS: 6.0000 | MEDICATED_PAD | Freq: Once | CUTANEOUS | Status: DC

## 2019-09-19 MED ORDER — CEFAZOLIN SODIUM-DEXTROSE 2-4 GM/100ML-% IV SOLN
2.0000 g | INTRAVENOUS | Status: AC
  Administered 2019-09-19 (×2): 2 g via INTRAVENOUS
  Filled 2019-09-19: qty 100

## 2019-09-19 MED ORDER — HYDROCHLOROTHIAZIDE 12.5 MG PO CAPS
12.5000 mg | ORAL_CAPSULE | Freq: Every day | ORAL | Status: DC
  Administered 2019-09-19 – 2019-09-22 (×4): 12.5 mg via ORAL
  Filled 2019-09-19 (×4): qty 1

## 2019-09-19 MED ORDER — FENTANYL CITRATE (PF) 250 MCG/5ML IJ SOLN
INTRAMUSCULAR | Status: AC
  Filled 2019-09-19: qty 5

## 2019-09-19 MED ORDER — PROPOFOL 1000 MG/100ML IV EMUL
INTRAVENOUS | Status: AC
  Filled 2019-09-19: qty 100

## 2019-09-19 MED ORDER — FENTANYL CITRATE (PF) 100 MCG/2ML IJ SOLN: 25.0000 ug | INTRAMUSCULAR | Status: DC | PRN

## 2019-09-19 MED ORDER — LIDOCAINE-EPINEPHRINE 0.5 %-1:200000 IJ SOLN
INTRAMUSCULAR | Status: AC
  Filled 2019-09-19: qty 1

## 2019-09-19 MED ORDER — ACETAMINOPHEN 500 MG PO TABS: 1000.0000 mg | ORAL_TABLET | Freq: Once | ORAL | Status: DC | PRN

## 2019-09-19 MED ORDER — SENNOSIDES-DOCUSATE SODIUM 8.6-50 MG PO TABS: 1.0000 | ORAL_TABLET | Freq: Every evening | ORAL | Status: DC | PRN

## 2019-09-19 MED ORDER — DIPHENHYDRAMINE HCL 50 MG/ML IJ SOLN
25.0000 mg | Freq: Four times a day (QID) | INTRAMUSCULAR | Status: DC | PRN
  Filled 2019-09-19: qty 1

## 2019-09-19 MED ORDER — LOSARTAN POTASSIUM 50 MG PO TABS
50.0000 mg | ORAL_TABLET | Freq: Every day | ORAL | Status: DC
  Administered 2019-09-19 – 2019-09-22 (×4): 50 mg via ORAL
  Filled 2019-09-19 (×4): qty 1

## 2019-09-19 MED ORDER — DIAZEPAM 5 MG PO TABS
5.0000 mg | ORAL_TABLET | Freq: Four times a day (QID) | ORAL | Status: DC | PRN
  Administered 2019-09-19 – 2019-09-22 (×9): 5 mg via ORAL
  Filled 2019-09-19 (×9): qty 1

## 2019-09-19 MED ORDER — MENTHOL 3 MG MT LOZG: 1.0000 | LOZENGE | OROMUCOSAL | Status: DC | PRN

## 2019-09-19 MED ORDER — PROPOFOL 10 MG/ML IV BOLUS
INTRAVENOUS | Status: AC
  Filled 2019-09-19: qty 20

## 2019-09-19 MED ORDER — LIDOCAINE 2% (20 MG/ML) 5 ML SYRINGE
INTRAMUSCULAR | Status: AC
  Filled 2019-09-19: qty 5

## 2019-09-19 MED ORDER — PROPOFOL 500 MG/50ML IV EMUL
INTRAVENOUS | Status: DC | PRN
  Administered 2019-09-19: 25 ug/kg/min via INTRAVENOUS

## 2019-09-19 MED ORDER — GABAPENTIN 300 MG PO CAPS
300.0000 mg | ORAL_CAPSULE | Freq: Three times a day (TID) | ORAL | Status: DC
  Administered 2019-09-19 – 2019-09-22 (×10): 300 mg via ORAL
  Filled 2019-09-19 (×10): qty 1

## 2019-09-19 MED ORDER — ACETAMINOPHEN 10 MG/ML IV SOLN
INTRAVENOUS | Status: DC | PRN
  Administered 2019-09-19: 1000 mg via INTRAVENOUS

## 2019-09-19 MED ORDER — PROPOFOL 10 MG/ML IV BOLUS
INTRAVENOUS | Status: DC | PRN
  Administered 2019-09-19: 200 mg via INTRAVENOUS

## 2019-09-19 MED ORDER — MIDAZOLAM HCL 2 MG/2ML IJ SOLN
INTRAMUSCULAR | Status: AC
  Filled 2019-09-19: qty 2

## 2019-09-19 MED ORDER — KETOROLAC TROMETHAMINE 15 MG/ML IJ SOLN
15.0000 mg | Freq: Four times a day (QID) | INTRAMUSCULAR | Status: AC
  Administered 2019-09-19 – 2019-09-20 (×4): 15 mg via INTRAVENOUS
  Filled 2019-09-19 (×4): qty 1

## 2019-09-19 MED ORDER — SUGAMMADEX SODIUM 200 MG/2ML IV SOLN
INTRAVENOUS | Status: DC | PRN
  Administered 2019-09-19: 200 mg via INTRAVENOUS

## 2019-09-19 MED ORDER — PANTOPRAZOLE SODIUM 40 MG PO TBEC
40.0000 mg | DELAYED_RELEASE_TABLET | Freq: Every day | ORAL | Status: DC
  Administered 2019-09-19 – 2019-09-22 (×4): 40 mg via ORAL
  Filled 2019-09-19 (×4): qty 1

## 2019-09-19 MED ORDER — FENTANYL CITRATE (PF) 250 MCG/5ML IJ SOLN
INTRAMUSCULAR | Status: DC | PRN
  Administered 2019-09-19 (×2): 100 ug via INTRAVENOUS
  Administered 2019-09-19 (×5): 50 ug via INTRAVENOUS

## 2019-09-19 MED ORDER — MAGNESIUM CITRATE PO SOLN: 1.0000 | Freq: Once | ORAL | Status: DC | PRN

## 2019-09-19 MED ORDER — LACTATED RINGERS IV SOLN: INTRAVENOUS | Status: DC

## 2019-09-19 MED ORDER — POTASSIUM CHLORIDE IN NACL 20-0.9 MEQ/L-% IV SOLN
INTRAVENOUS | Status: DC
  Filled 2019-09-19 (×2): qty 1000

## 2019-09-19 MED ORDER — FENTANYL CITRATE (PF) 100 MCG/2ML IJ SOLN
25.0000 ug | INTRAMUSCULAR | Status: DC | PRN
  Administered 2019-09-19 (×2): 25 ug via INTRAVENOUS

## 2019-09-19 MED ORDER — ONDANSETRON HCL 4 MG/2ML IJ SOLN
INTRAMUSCULAR | Status: DC | PRN
  Administered 2019-09-19: 4 mg via INTRAVENOUS

## 2019-09-19 MED ORDER — PHENOL 1.4 % MT LIQD: 1.0000 | OROMUCOSAL | Status: DC | PRN

## 2019-09-19 MED ORDER — ONDANSETRON HCL 4 MG PO TABS: 4.0000 mg | ORAL_TABLET | Freq: Four times a day (QID) | ORAL | Status: DC | PRN

## 2019-09-19 MED ORDER — ACETAMINOPHEN 160 MG/5ML PO SOLN: 1000.0000 mg | Freq: Once | ORAL | Status: DC | PRN

## 2019-09-19 MED ORDER — ROCURONIUM BROMIDE 10 MG/ML (PF) SYRINGE
PREFILLED_SYRINGE | INTRAVENOUS | Status: AC
  Filled 2019-09-19: qty 10

## 2019-09-19 MED ORDER — PHENYLEPHRINE HCL-NACL 10-0.9 MG/250ML-% IV SOLN
INTRAVENOUS | Status: DC | PRN
  Administered 2019-09-19: 20 ug/min via INTRAVENOUS

## 2019-09-19 MED ORDER — DIPHENHYDRAMINE HCL 50 MG/ML IJ SOLN
INTRAMUSCULAR | Status: AC
  Filled 2019-09-19: qty 1

## 2019-09-19 MED ORDER — BUPIVACAINE HCL (PF) 0.5 % IJ SOLN
INTRAMUSCULAR | Status: DC | PRN
  Administered 2019-09-19: 30 mL

## 2019-09-19 MED ORDER — OXYCODONE HCL 5 MG PO TABS: 5.0000 mg | ORAL_TABLET | Freq: Once | ORAL | Status: DC | PRN

## 2019-09-19 MED ORDER — ACETAMINOPHEN 10 MG/ML IV SOLN
INTRAVENOUS | Status: AC
  Filled 2019-09-19: qty 100

## 2019-09-19 MED ORDER — 0.9 % SODIUM CHLORIDE (POUR BTL) OPTIME
TOPICAL | Status: DC | PRN
  Administered 2019-09-19: 1000 mL

## 2019-09-19 MED ORDER — ONDANSETRON HCL 4 MG/2ML IJ SOLN: 4.0000 mg | Freq: Four times a day (QID) | INTRAMUSCULAR | Status: DC | PRN

## 2019-09-19 MED ORDER — ORAL CARE MOUTH RINSE: 15.0000 mL | Freq: Once | OROMUCOSAL | Status: AC

## 2019-09-19 MED ORDER — SENNA 8.6 MG PO TABS
1.0000 | ORAL_TABLET | Freq: Two times a day (BID) | ORAL | Status: DC
  Administered 2019-09-19 – 2019-09-22 (×6): 8.6 mg via ORAL
  Filled 2019-09-19 (×6): qty 1

## 2019-09-19 MED ORDER — CHLORHEXIDINE GLUCONATE 0.12 % MT SOLN
15.0000 mL | Freq: Once | OROMUCOSAL | Status: AC
  Administered 2019-09-19: 15 mL via OROMUCOSAL
  Filled 2019-09-19: qty 15

## 2019-09-19 MED ORDER — BACITRACIN ZINC 500 UNIT/GM EX OINT
TOPICAL_OINTMENT | CUTANEOUS | Status: AC
  Filled 2019-09-19: qty 28.35

## 2019-09-19 MED ORDER — CHLORHEXIDINE GLUCONATE CLOTH 2 % EX PADS
6.0000 | MEDICATED_PAD | Freq: Every day | CUTANEOUS | Status: DC
  Administered 2019-09-20 – 2019-09-21 (×2): 6 via TOPICAL

## 2019-09-19 MED ORDER — SODIUM CHLORIDE 0.9% FLUSH
3.0000 mL | Freq: Two times a day (BID) | INTRAVENOUS | Status: DC
  Administered 2019-09-19 – 2019-09-22 (×5): 3 mL via INTRAVENOUS

## 2019-09-19 MED ORDER — HYDROMORPHONE HCL 1 MG/ML IJ SOLN
0.2500 mg | INTRAMUSCULAR | Status: DC | PRN
  Administered 2019-09-19: 0.25 mg via INTRAVENOUS
  Administered 2019-09-20 – 2019-09-21 (×2): 0.5 mg via INTRAVENOUS
  Filled 2019-09-19 (×4): qty 0.5

## 2019-09-19 MED ORDER — MIDAZOLAM HCL 5 MG/5ML IJ SOLN
INTRAMUSCULAR | Status: DC | PRN
  Administered 2019-09-19: 2 mg via INTRAVENOUS

## 2019-09-19 MED ORDER — SODIUM CHLORIDE 0.9% FLUSH: 3.0000 mL | INTRAVENOUS | Status: DC | PRN

## 2019-09-19 MED ORDER — FENTANYL CITRATE (PF) 100 MCG/2ML IJ SOLN
INTRAMUSCULAR | Status: AC
  Filled 2019-09-19: qty 2

## 2019-09-19 MED ORDER — THROMBIN 20000 UNITS EX SOLR
CUTANEOUS | Status: AC
  Filled 2019-09-19: qty 20000

## 2019-09-19 MED ORDER — BUPIVACAINE HCL (PF) 0.5 % IJ SOLN
INTRAMUSCULAR | Status: AC
  Filled 2019-09-19: qty 30

## 2019-09-19 MED ORDER — ACETAMINOPHEN 10 MG/ML IV SOLN: 1000.0000 mg | Freq: Once | INTRAVENOUS | Status: DC | PRN

## 2019-09-19 MED ORDER — DEXAMETHASONE SODIUM PHOSPHATE 10 MG/ML IJ SOLN
INTRAMUSCULAR | Status: AC
  Filled 2019-09-19: qty 1

## 2019-09-19 MED ORDER — OXYCODONE HCL 5 MG/5ML PO SOLN: 5.0000 mg | Freq: Once | ORAL | Status: DC | PRN

## 2019-09-19 MED ORDER — LIDOCAINE-EPINEPHRINE 0.5 %-1:200000 IJ SOLN
INTRAMUSCULAR | Status: DC | PRN
  Administered 2019-09-19: 5 mL

## 2019-09-19 MED ORDER — BISACODYL 5 MG PO TBEC
5.0000 mg | DELAYED_RELEASE_TABLET | Freq: Every day | ORAL | Status: DC | PRN
  Administered 2019-09-22: 5 mg via ORAL
  Filled 2019-09-19: qty 1

## 2019-09-19 MED ORDER — THROMBIN 20000 UNITS EX SOLR: CUTANEOUS | Status: DC | PRN

## 2019-09-19 MED ORDER — ONDANSETRON HCL 4 MG/2ML IJ SOLN
INTRAMUSCULAR | Status: AC
  Filled 2019-09-19: qty 2

## 2019-09-19 MED ORDER — CEFAZOLIN SODIUM 1 G IJ SOLR
INTRAMUSCULAR | Status: AC
  Filled 2019-09-19: qty 20

## 2019-09-19 MED ORDER — ZOLPIDEM TARTRATE 5 MG PO TABS: 5.0000 mg | ORAL_TABLET | Freq: Every evening | ORAL | Status: DC | PRN

## 2019-09-19 MED ORDER — ACETAMINOPHEN 650 MG RE SUPP: 650.0000 mg | RECTAL | Status: DC | PRN

## 2019-09-19 MED ORDER — DEXAMETHASONE SODIUM PHOSPHATE 10 MG/ML IJ SOLN
INTRAMUSCULAR | Status: DC | PRN
  Administered 2019-09-19: 10 mg via INTRAVENOUS

## 2019-09-19 MED ORDER — PHENYLEPHRINE 40 MCG/ML (10ML) SYRINGE FOR IV PUSH (FOR BLOOD PRESSURE SUPPORT)
PREFILLED_SYRINGE | INTRAVENOUS | Status: AC
  Filled 2019-09-19: qty 10

## 2019-09-19 MED ORDER — ACETAMINOPHEN 325 MG PO TABS
650.0000 mg | ORAL_TABLET | ORAL | Status: DC | PRN
  Administered 2019-09-19 – 2019-09-22 (×8): 650 mg via ORAL
  Filled 2019-09-19 (×8): qty 2

## 2019-09-19 MED ORDER — ROCURONIUM BROMIDE 10 MG/ML (PF) SYRINGE
PREFILLED_SYRINGE | INTRAVENOUS | Status: DC | PRN
  Administered 2019-09-19: 100 mg via INTRAVENOUS
  Administered 2019-09-19: 20 mg via INTRAVENOUS
  Administered 2019-09-19: 30 mg via INTRAVENOUS

## 2019-09-19 MED ORDER — LIDOCAINE 2% (20 MG/ML) 5 ML SYRINGE
INTRAMUSCULAR | Status: DC | PRN
  Administered 2019-09-19: 60 mg via INTRAVENOUS

## 2019-09-19 MED ORDER — DIPHENHYDRAMINE HCL 50 MG/ML IJ SOLN
INTRAMUSCULAR | Status: DC | PRN
  Administered 2019-09-19: 12.5 mg via INTRAVENOUS

## 2019-09-19 MED ORDER — SODIUM CHLORIDE 0.9 % IV SOLN: 250.0000 mL | INTRAVENOUS | Status: DC

## 2019-09-19 MED ORDER — BACITRACIN ZINC 500 UNIT/GM EX OINT
TOPICAL_OINTMENT | CUTANEOUS | Status: DC | PRN
  Administered 2019-09-19: 1 via TOPICAL

## 2019-09-19 SURGICAL SUPPLY — 77 items
ADH SKN CLS APL DERMABOND .7 (GAUZE/BANDAGES/DRESSINGS)
APL SKNCLS STERI-STRIP NONHPOA (GAUZE/BANDAGES/DRESSINGS)
BENZOIN TINCTURE PRP APPL 2/3 (GAUZE/BANDAGES/DRESSINGS) IMPLANT
BIT DRILL PLIF MAS DISP 5.5MM (DRILL) IMPLANT
BLADE CLIPPER SURG (BLADE) IMPLANT
BUR MATCHSTICK NEURO 3.0 LAGG (BURR) ×3 IMPLANT
BUR PRECISION FLUTE 5.0 (BURR) ×3 IMPLANT
CANISTER SUCT 3000ML PPV (MISCELLANEOUS) ×3 IMPLANT
CARTRIDGE OIL MAESTRO DRILL (MISCELLANEOUS) ×1 IMPLANT
CASCADIA AN CONVEX 8.5X28X11 (Cage) ×6 IMPLANT
CLOSURE WOUND 1/2 X4 (GAUZE/BANDAGES/DRESSINGS)
CNTNR URN SCR LID CUP LEK RST (MISCELLANEOUS) ×1 IMPLANT
CONT SPEC 4OZ STRL OR WHT (MISCELLANEOUS) ×3
COVER BACK TABLE 60X90IN (DRAPES) ×3 IMPLANT
COVER WAND RF STERILE (DRAPES) ×1 IMPLANT
DECANTER SPIKE VIAL GLASS SM (MISCELLANEOUS) ×3 IMPLANT
DERMABOND ADVANCED (GAUZE/BANDAGES/DRESSINGS)
DERMABOND ADVANCED .7 DNX12 (GAUZE/BANDAGES/DRESSINGS) ×1 IMPLANT
DIFFUSER DRILL AIR PNEUMATIC (MISCELLANEOUS) ×3 IMPLANT
DRAPE C-ARM 42X72 X-RAY (DRAPES) ×4 IMPLANT
DRAPE C-ARMOR (DRAPES) ×2 IMPLANT
DRAPE LAPAROTOMY 100X72X124 (DRAPES) ×3 IMPLANT
DRAPE SURG 17X23 STRL (DRAPES) ×3 IMPLANT
DRILL PLIF MAS DISP 5.5MM (DRILL) ×3
DRSG OPSITE POSTOP 4X8 (GAUZE/BANDAGES/DRESSINGS) ×2 IMPLANT
DURAPREP 26ML APPLICATOR (WOUND CARE) ×3 IMPLANT
ELECT REM PT RETURN 9FT ADLT (ELECTROSURGICAL) ×3
ELECTRODE REM PT RTRN 9FT ADLT (ELECTROSURGICAL) ×1 IMPLANT
GAUZE 4X4 16PLY RFD (DISPOSABLE) IMPLANT
GAUZE SPONGE 4X4 12PLY STRL (GAUZE/BANDAGES/DRESSINGS) IMPLANT
GLOVE BIO SURGEON STRL SZ8 (GLOVE) ×2 IMPLANT
GLOVE BIOGEL PI IND STRL 7.5 (GLOVE) IMPLANT
GLOVE BIOGEL PI IND STRL 8 (GLOVE) IMPLANT
GLOVE BIOGEL PI IND STRL 8.5 (GLOVE) IMPLANT
GLOVE BIOGEL PI INDICATOR 7.5 (GLOVE) ×2
GLOVE BIOGEL PI INDICATOR 8 (GLOVE) ×12
GLOVE BIOGEL PI INDICATOR 8.5 (GLOVE) ×2
GLOVE ECLIPSE 6.5 STRL STRAW (GLOVE) ×8 IMPLANT
GLOVE ECLIPSE 7.0 STRL STRAW (GLOVE) ×4 IMPLANT
GLOVE ECLIPSE 7.5 STRL STRAW (GLOVE) ×8 IMPLANT
GLOVE EXAM NITRILE XL STR (GLOVE) IMPLANT
GOWN STRL REUS W/ TWL LRG LVL3 (GOWN DISPOSABLE) ×2 IMPLANT
GOWN STRL REUS W/ TWL XL LVL3 (GOWN DISPOSABLE) IMPLANT
GOWN STRL REUS W/TWL 2XL LVL3 (GOWN DISPOSABLE) ×6 IMPLANT
GOWN STRL REUS W/TWL LRG LVL3 (GOWN DISPOSABLE) ×9
GOWN STRL REUS W/TWL XL LVL3 (GOWN DISPOSABLE) ×3
INTERBODY CSCD AN CVX8.5X28X11 (Cage) IMPLANT
KIT BASIN OR (CUSTOM PROCEDURE TRAY) ×3 IMPLANT
KIT POSITION SURG JACKSON T1 (MISCELLANEOUS) ×3 IMPLANT
KIT TURNOVER KIT B (KITS) ×3 IMPLANT
MILL MEDIUM DISP (BLADE) IMPLANT
NDL HYPO 25X1 1.5 SAFETY (NEEDLE) ×1 IMPLANT
NDL SPNL 18GX3.5 QUINCKE PK (NEEDLE) IMPLANT
NEEDLE HYPO 25X1 1.5 SAFETY (NEEDLE) ×3 IMPLANT
NEEDLE SPNL 18GX3.5 QUINCKE PK (NEEDLE) ×3 IMPLANT
NS IRRIG 1000ML POUR BTL (IV SOLUTION) ×3 IMPLANT
OIL CARTRIDGE MAESTRO DRILL (MISCELLANEOUS) ×3
PACK LAMINECTOMY NEURO (CUSTOM PROCEDURE TRAY) ×3 IMPLANT
PAD ARMBOARD 7.5X6 YLW CONV (MISCELLANEOUS) ×6 IMPLANT
ROD 50MM LUMBAR (Rod) ×4 IMPLANT
SCREW LOCK (Screw) ×12 IMPLANT
SCREW LOCK FXNS SPNE MAS PL (Screw) IMPLANT
SCREW SHANK 5.5X40MM (Screw) ×4 IMPLANT
SCREW TULIP 5.5 (Screw) ×4 IMPLANT
SPONGE LAP 4X18 RFD (DISPOSABLE) IMPLANT
SPONGE SURGIFOAM ABS GEL 100 (HEMOSTASIS) ×3 IMPLANT
STRIP CLOSURE SKIN 1/2X4 (GAUZE/BANDAGES/DRESSINGS) IMPLANT
SUT ETHILON 3 0 FSLX (SUTURE) ×2 IMPLANT
SUT PROLENE 6 0 BV (SUTURE) IMPLANT
SUT VIC AB 0 CT1 18XCR BRD8 (SUTURE) ×1 IMPLANT
SUT VIC AB 0 CT1 8-18 (SUTURE) ×6
SUT VIC AB 2-0 CT1 18 (SUTURE) ×3 IMPLANT
SUT VIC AB 3-0 SH 8-18 (SUTURE) ×7 IMPLANT
TOWEL GREEN STERILE (TOWEL DISPOSABLE) ×3 IMPLANT
TOWEL GREEN STERILE FF (TOWEL DISPOSABLE) ×3 IMPLANT
TRAY FOLEY MTR SLVR 16FR STAT (SET/KITS/TRAYS/PACK) ×3 IMPLANT
WATER STERILE IRR 1000ML POUR (IV SOLUTION) ×3 IMPLANT

## 2019-09-19 NOTE — Anesthesia Preprocedure Evaluation (Signed)
Anesthesia Evaluation  Patient identified by MRN, date of birth, ID band Patient awake    Reviewed: Allergy & Precautions, NPO status , Patient's Chart, lab work & pertinent test results  History of Anesthesia Complications (+) PONV and history of anesthetic complications  Airway Mallampati: IV  TM Distance: >3 FB Neck ROM: Full  Mouth opening: Limited Mouth Opening  Dental  (+) Teeth Intact, Dental Advisory Given   Pulmonary sleep apnea and Continuous Positive Airway Pressure Ventilation , neg recent URI, former smoker,    breath sounds clear to auscultation       Cardiovascular hypertension, Pt. on medications  Rhythm:Regular     Neuro/Psych  Headaches,    GI/Hepatic Neg liver ROS, GERD  Medicated and Controlled,  Endo/Other  negative endocrine ROS  Renal/GU negative Renal ROS     Musculoskeletal  (+) Arthritis ,   Abdominal   Peds  Hematology negative hematology ROS (+)   Anesthesia Other Findings   Reproductive/Obstetrics                             Anesthesia Physical Anesthesia Plan  ASA: II  Anesthesia Plan: General   Post-op Pain Management:    Induction: Intravenous  PONV Risk Score and Plan: 3 and Ondansetron and Dexamethasone  Airway Management Planned: Oral ETT and Video Laryngoscope Planned  Additional Equipment: None  Intra-op Plan:   Post-operative Plan: Extubation in OR  Informed Consent: I have reviewed the patients History and Physical, chart, labs and discussed the procedure including the risks, benefits and alternatives for the proposed anesthesia with the patient or authorized representative who has indicated his/her understanding and acceptance.     Dental advisory given  Plan Discussed with: CRNA and Surgeon  Anesthesia Plan Comments:         Anesthesia Quick Evaluation

## 2019-09-19 NOTE — Anesthesia Procedure Notes (Signed)
Procedure Name: Intubation Performed by: Renato Shin, CRNA Pre-anesthesia Checklist: Patient identified, Emergency Drugs available, Suction available and Patient being monitored Patient Re-evaluated:Patient Re-evaluated prior to induction Oxygen Delivery Method: Circle system utilized Preoxygenation: Pre-oxygenation with 100% oxygen Induction Type: IV induction Ventilation: Mask ventilation without difficulty and Oral airway inserted - appropriate to patient size Laryngoscope Size: Glidescope and 4 Grade View: Grade I Tube type: Oral Tube size: 7.5 mm Number of attempts: 1 Airway Equipment and Method: Oral airway,  Rigid stylet and Video-laryngoscopy Placement Confirmation: ETT inserted through vocal cords under direct vision,  positive ETCO2 and breath sounds checked- equal and bilateral Secured at: 22 cm Tube secured with: Tape Dental Injury: Teeth and Oropharynx as per pre-operative assessment  Difficulty Due To: Difficulty was anticipated and Difficult Airway- due to large tongue Future Recommendations: Recommend- induction with short-acting agent, and alternative techniques readily available

## 2019-09-19 NOTE — Transfer of Care (Signed)
Immediate Anesthesia Transfer of Care Note  Patient: Troy Franklin  Procedure(s) Performed: LUMBAR TWO-THREE POSTERIOR LUMBAR INTERBODY FUSION (N/A Back)  Patient Location: PACU  Anesthesia Type:General  Level of Consciousness: drowsy and patient cooperative  Airway & Oxygen Therapy: Patient Spontanous Breathing and Patient connected to face mask oxygen  Post-op Assessment: Report given to RN and Post -op Vital signs reviewed and stable  Post vital signs: Reviewed and stable  Last Vitals:  Vitals Value Taken Time  BP 112/59 09/19/19 1405  Temp    Pulse 95 09/19/19 1408  Resp 23 09/19/19 1408  SpO2 96 % 09/19/19 1408  Vitals shown include unvalidated device data.  Last Pain:  Vitals:   09/19/19 0706  TempSrc:   PainSc: 0-No pain         Complications: No complications documented.

## 2019-09-19 NOTE — H&P (Signed)
BP 132/82   Pulse 67   Temp 97.8 F (36.6 C) (Temporal)   Resp 18   Ht 5\' 7"  (1.702 m)   Wt 107.6 kg   SpO2 98%   BMI 37.17 kg/m     Troy Franklin comes in today.  He, at this point, because I had seen him last just at the onset of COVID when we were shut down, essentially is still complaining of neurogenic symptoms consisting of neurogenic claudication.  He has some weakness in the lower extremities.  He was weak in the hip flexors.  He was also weak in his hamstrings.  Reflexes are intact 2+ at the knees and ankles bilaterally.  Gait is antalgic.  It does take him a while to go from a sitting to a standing position Mr. Troy Franklin returns with an MRI of the lumbar spine performed on August 23, 2019.  What it shows is he has a pretty significant retrolisthesis now of L2 on 3.  The disc space is markedly degenerated, having advanced from the most recent film that he had done.  He is stenotic at that level and this is the adjacent segment.  I have recommended, and he has agreed to, undergoing operative decompression at L2-3 with the use of pedicle screws and extending the arthrodesis which he already has, along with the hardware.  Risks and benefits he is well-versed on, having had the procedure before.  We plan on doing this on the 9th of July.  Allergies  Allergen Reactions  . Codeine Anaphylaxis  . Onion Other (See Comments)    Migraines    Past Medical History:  Diagnosis Date  . Arthritis    lumbar  . Complication of anesthesia    difficulty waking up- sleepiness lasted longer than other times.   Troy Franklin GERD (gastroesophageal reflux disease)   . 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 Hypertension   . Loud breathing during sleep    snoring, states the sleep study was WNL, done betwn 1995-2005  . PONV (postoperative nausea and vomiting)   . Sleep apnea    Cpap  . Spondylolisthesis of lumbar region    Past Surgical History:  Procedure Laterality Date  . ANKLE FRACTURE SURGERY     right  - pins and screws  . BACK SURGERY    . CERVICAL FUSION  2013  . CERVICAL FUSION     2007 & 2013  . HERNIA REPAIR  1974   hydrocele  . KNEE LIGAMENT RECONSTRUCTION     right   . LUMBAR LAMINECTOMY/DECOMPRESSION MICRODISCECTOMY N/A 10/10/2017   Procedure: Lumbar Three-Four Laminectomy;  Surgeon: Ashok Pall, MD;  Location: [place];  Service: Neurosurgery;  Laterality: N/A;  . VASECTOMY     History reviewed. No pertinent family history. Social History   Socioeconomic History  . Marital status: Married    Spouse name: Not on file  . Number of children: Not on file  . Years of education: Not on file  . Highest education level: Not on file  Occupational History  . Not on file  Tobacco Use  . Smoking status: Former Smoker    Packs/day: 0.25    Years: 4.00    Pack years: 1.00    Types: Cigarettes    Quit date: 12/16/2018    Years since quitting: 0.7  . Smokeless tobacco: Never Used  Vaping Use  . Vaping Use: Never used  Substance and Sexual Activity  . Alcohol use: Yes  Comment: socially- x1/month  . Drug use: No  . Sexual activity: Not on file  Other Topics Concern  . Not on file  Social History Narrative  . Not on file   Social Determinants of Health   Financial Resource Strain:   . Difficulty of Paying Living Expenses:   Food Insecurity:   . Worried About Charity fundraiser in the Last Year:   . Arboriculturist in the Last Year:   Transportation Needs:   . Film/video editor (Medical):   Troy Franklin Lack of Transportation (Non-Medical):   Physical Activity:   . Days of Exercise per Week:   . Minutes of Exercise per Session:   Stress:   . Feeling of Stress :   Social Connections:   . Frequency of Communication with Friends and Family:   . Frequency of Social Gatherings with Friends and Family:   . Attends Religious Services:   . Active Member of Clubs or Organizations:   . Attends Archivist Meetings:   Troy Franklin Marital Status:   Intimate Partner Violence:    . Fear of Current or Ex-Partner:   . Emotionally Abused:   Troy Franklin Physically Abused:   . Sexually Abused:    Troy Franklin will be admitted for a lumbar decompression and arthrodesis at L2/3

## 2019-09-19 NOTE — Op Note (Signed)
09/19/2019  2:24 PM  PATIENT:  Troy Franklin  49 y.o. male with adjacent level retrolisthesis and stenosis, not relieved by non instrumented decompression. 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, lumbar stenosis PROCEDURE:  Procedure(s): LUMBAR TWO-THREE POSTERIOR LUMBAR INTERBODY FUSION Stryker Cascadia cages packed with autograft morsels 11x16mm x2 cages nonsegmental pedicle screw fixation L2/3(nuvasive relign) L4 screw removal bilateral L2 laminectomy and inferior facetecomy far in excess of the needed exposure for a PLIF SURGEON:  Surgeon(s): Ashok Pall, MD Erline Levine, MD  ASSISTANTS:Stern, Broadus John  ANESTHESIA:   general  EBL:  Total I/O In: 2350 [I.V.:2050; IV Piggyback:300] Out: 925 [Urine:675; Blood:250]  BLOOD ADMINISTERED:none  CELL SAVER GIVEN:none  COUNT:per nursing  DRAINS: none   SPECIMEN:  No Specimen  DICTATION: [name] is a 49 y.o. male whom was taken to the operating room intubated, and placed under a general anesthetic without difficulty. A foley catheter was placed under sterile conditions. He was positioned prone on a Jackson table with all pressure points properly padded.  His lumbar region was prepped and draped in a sterile manner. I infiltrated 8cc's 1/2%lidocaine/1:2000,000 strength epinephrine into the planned incision. I opened the skin with a 10 blade and took the incision down to the thoracolumbar fascia. I exposed the lamina of L1,and 2, and the remnants of 3 and the existing hardware in a subperiosteal fashion bilaterally. I confirmed my location with an intraoperative xray.  I placed self retaining retractors and started the decompression.  We decompressed the spinal canal via complete laminectomy of L2 and inferior facetectomies of L2, with partial superior facetectomies of L3. I removed the bone and  ligament with the drill and Kerrison punches. The ligamentum flavum was thick and hard and compressing the thecal sac. The facetectomies allowed for complete decompression of the L2 roots. I exposed the disc spaces at L2/3, and more which was well beyond the needed exposure for a plif.  PLIF's were performed at L2/3 in the same fashion. I opened the disc space with a 15 blade then used a variety of instruments to open the disc space to remove the disc and prepare the space for the arthrodesis. I used curettes, rongeurs, punches, shavers for the disc space, and rasps in the discetomy. I measured the disc space and placed 2 37mm titanium cages(Stryker cascadia) into the disc space(s).  I placed pedicle screws at L2, using fluoroscopic guidance. I drilled a pilot hole, then cannulated the pedicle with a drill at each site. I then tapped each pedicle, assessing each site for pedicle violations. No cutouts were appreciated. Screws (nuvasive relign) were then placed at each site without difficulty. I attached rods and locking caps with the appropriate tools. The locking caps were secured with torque limited screwdrivers. Final films were performed and the final construct appeared to be in good position.  I closed the wound in a layered fashion. I approximated the thoracolumbar fascia, subcutaneous, and subcuticular planes with vicryl sutures. I used an occlusive bandage for a sterile dressing.     PLAN OF CARE: Admit to inpatient   PATIENT DISPOSITION:  PACU - hemodynamically stable.   Delay start of Pharmacological VTE agent (>24hrs) due to surgical blood loss or risk of bleeding:  yes

## 2019-09-20 MED ORDER — HYDROMORPHONE HCL 2 MG PO TABS
2.0000 mg | ORAL_TABLET | ORAL | Status: DC | PRN
  Administered 2019-09-20 (×2): 1 mg via ORAL
  Administered 2019-09-21 – 2019-09-22 (×5): 2 mg via ORAL
  Filled 2019-09-20 (×7): qty 1

## 2019-09-20 NOTE — Progress Notes (Signed)
Subjective: Patient reports quite sore in back  Objective: Vital signs in last 24 hours: Temp:  [97.3 F (36.3 C)-98.8 F (37.1 C)] 98.8 F (37.1 C) (07/10 0738) Pulse Rate:  [77-94] 80 (07/10 0738) Resp:  [14-22] 18 (07/10 0738) BP: (93-135)/(55-75) 125/68 (07/10 0738) SpO2:  [92 %-100 %] 98 % (07/10 0738)  Intake/Output from previous day: 07/09 0701 - 07/10 0700 In: 3228.3 [I.V.:2928.3; IV Piggyback:300] Out: 2240 [Urine:1990; Blood:250] Intake/Output this shift: Total I/O In: -  Out: 650 [Urine:650]  Physical Exam: Reinforced dressing with minimal spotting.  Strength full in both lower extremities.  Patient is quite sore in back.  Lab Results: Recent Labs    09/17/19 1448  WBC 7.7  HGB 14.2  HCT 40.7  PLT 166   BMET Recent Labs    09/17/19 1448  NA 139  K 3.8  CL 105  CO2 25  GLUCOSE 103*  BUN 14  CREATININE 1.16  CALCIUM 9.1    Studies/Results: DG Lumbar Spine 2-3 Views  Result Date: 09/19/2019 CLINICAL DATA:  L2-3 PLIF. FLUOROSCOPY TIME:  1 minutes 4 seconds. Images: 3 EXAM: LUMBAR SPINE - 2-3 VIEW COMPARISON:  March 11, 2017 FINDINGS: Throughout the study, pedicle screws seen previously at L4 have been removed. A disc spacer device is seen at L4-5, stable. The disc spacer device at L3-4 is stable. There is a new disc spacer device at L2-3. Pedicle screws remain at L3. New pedicle screws are seen at L2. IMPRESSION: Hardware revisions/placement as above. Electronically Signed   By: Dorise Bullion III M.D   On: 09/19/2019 15:42   DG C-Arm 1-60 Min  Result Date: 09/19/2019 CLINICAL DATA:  L2-3 PLIF. FLUOROSCOPY TIME:  1 minutes 4 seconds. Images: 3 EXAM: LUMBAR SPINE - 2-3 VIEW COMPARISON:  March 11, 2017 FINDINGS: Throughout the study, pedicle screws seen previously at L4 have been removed. A disc spacer device is seen at L4-5, stable. The disc spacer device at L3-4 is stable. There is a new disc spacer device at L2-3. Pedicle screws remain at L3. New  pedicle screws are seen at L2. IMPRESSION: Hardware revisions/placement as above. Electronically Signed   By: Dorise Bullion III M.D   On: 09/19/2019 15:43    Assessment/Plan: Patient is doing well.  Mobilize today.  Continue analgesics for pain control.      LOS: 1 day    Peggyann Shoals, MD 09/20/2019, 11:17 AM

## 2019-09-20 NOTE — Anesthesia Postprocedure Evaluation (Signed)
Anesthesia Post Note  Patient: Lamondre Wesche Sardinas  Procedure(s) Performed: LUMBAR TWO-THREE POSTERIOR LUMBAR INTERBODY FUSION (N/A Back)     Patient location during evaluation: PACU Anesthesia Type: General Level of consciousness: awake and alert Pain management: pain level controlled Vital Signs Assessment: post-procedure vital signs reviewed and stable Respiratory status: spontaneous breathing, nonlabored ventilation, respiratory function stable and patient connected to nasal cannula oxygen Cardiovascular status: blood pressure returned to baseline and stable Postop Assessment: no apparent nausea or vomiting Anesthetic complications: no   No complications documented.  Last Vitals:  Vitals:   09/20/19 0412 09/20/19 0738  BP: (!) 103/55 125/68  Pulse: 79 80  Resp: 20 18  Temp: 36.9 C 37.1 C  SpO2: 96% 98%    Last Pain:  Vitals:   09/20/19 0850  TempSrc:   PainSc: 6                  Renan Danese

## 2019-09-20 NOTE — Progress Notes (Signed)
2000- Upon assessment of back bandage, some old bloody drainage was noticed and marked. While patient was on his side, bright red blood starting filling up the honeycomb dressing. Patient was laid back to his back and Dr. Vertell Limber with Neurosurgery was notified. Verbal order to reinforce dressing. If dressing became too saturated, then take off the original dressing and redress the surgical incision with gauze. Dressing was reinforced and rechecked frequently. Bleeding stopped and dressing stopped filling up with blood.   2200- Patient's pain was not being controlled despite PRN valium for spasms and tylenol given along with his scheduled toradol and gabapentin. NP with Neurosurgery was made aware. New order for PRN IV dilaudid .25-.5mg  every 2 hours. NP made aware of patient's codene allergy. New order for IV benadryl PRN q6h if reaction to IV pain medicine. Patient's pain under control after dilaudid given.

## 2019-09-20 NOTE — Evaluation (Signed)
Occupational Therapy Evaluation and Discharge Patient Details Name: Troy Franklin MRN: [id number] DOB: 05/12/1970 Today's Date: 09/20/2019    History of Present Illness This  49 yo male  is s/p LUMBAR TWO-THREE POSTERIOR LUMBAR INTERBODY FUSION. He reports this is his 7th back surgery   Clinical Impression   This 49 yo male admitted and underwent above presents to acute OT with all education completed, we will D/C from acute OT.    Follow Up Recommendations  No OT follow up;Supervision - Intermittent    Equipment Recommendations  3 in 1 bedside commode       Precautions / Restrictions Precautions Precautions: Back Precaution Booklet Issued: Yes (comment) Precaution Comments: Pt able to state all 3 back precautions to me Required Braces or Orthoses:  (none required per orders') Restrictions Weight Bearing Restrictions: No      Mobility Bed Mobility Overal bed mobility: Needs Assistance Bed Mobility: Sit to Sidelying         Sit to sidelying: Supervision    Transfers Overall transfer level: Needs assistance Equipment used: Rolling walker (2 wheeled) Transfers: Sit to/from Stand Sit to Stand: Supervision              Balance Overall balance assessment: Mild deficits observed, not formally tested                                         ADL either performed or assessed with clinical judgement   ADL                                         General ADL Comments: Educated pt on use of wet wipes for back pericare as well as toileting tongs (pt to get on his own), use of two cups to brush teeth to avoid bending over sink, side step into tub (he ambulated to gym and I demonstrated this to him) as well as how to use the 3n1 as a shower seat (or he could get a tub bench or seat on his own), best position for LBD (wife to A him initially)     Vision Patient Visual Report: No change from baseline              Pertinent  Vitals/Pain Pain Assessment: Faces Faces Pain Scale: Hurts little more Pain Location: incisional Pain Descriptors / Indicators: Aching;Sore Pain Intervention(s): Limited activity within patient's tolerance;Monitored during session;Repositioned     Hand Dominance Right   Extremity/Trunk Assessment Upper Extremity Assessment Upper Extremity Assessment: Overall WFL for tasks assessed           Communication Communication Communication: No difficulties   Cognition Arousal/Alertness: Awake/alert Behavior During Therapy: WFL for tasks assessed/performed Overall Cognitive Status: Within Functional Limits for tasks assessed                                                Home Living Family/patient expects to be discharged to:: Private residence Living Arrangements: Spouse/significant other Available Help at Discharge: Family;Available PRN/intermittently Type of Home: House             Bathroom Shower/Tub: Tub/shower unit;Curtain   Biochemist, clinical: Standard  Home Equipment: Walker - 2 wheels          Prior Functioning/Environment Level of Independence: Independent                 OT Problem List: Decreased range of motion;Pain         OT Goals(Current goals can be found in the care plan section) Acute Rehab OT Goals Patient Stated Goal: to go home tomorrow  OT Frequency:                AM-PAC OT "6 Clicks" Daily Activity     Outcome Measure Help from another person eating meals?: None Help from another person taking care of personal grooming?: A Little Help from another person toileting, which includes using toliet, bedpan, or urinal?: A Little Help from another person bathing (including washing, rinsing, drying)?: A Little Help from another person to put on and taking off regular upper body clothing?: A Little Help from another person to put on and taking off regular lower body clothing?: A Lot 6 Click Score: 18   End of  Session Equipment Utilized During Treatment: Rolling walker Nurse Communication:  (emptied 350 ccs fo urine, and RN in to see his back dressing)  Activity Tolerance: Patient tolerated treatment well Patient left: in bed;with call bell/phone within reach  OT Visit Diagnosis: Other abnormalities of gait and mobility (R26.89);Pain Pain - part of body:  (incisional;)                Time: 1021-1056 OT Time Calculation (min): 35 min Charges:  OT General Charges $OT Visit: 1 Visit OT Evaluation $OT Eval Moderate Complexity: 1 Mod OT Treatments $Self Care/Home Management : 8-22 mins  [address] Circle, OTR/L Acute [employer] Pager [phone] Office [phone]     Almon Register 09/20/2019, 11:21 AM

## 2019-09-21 MED ORDER — POLYETHYLENE GLYCOL 3350 17 G PO PACK
17.0000 g | PACK | Freq: Once | ORAL | Status: AC
  Administered 2019-09-21: 17 g via ORAL
  Filled 2019-09-21: qty 1

## 2019-09-21 MED ORDER — TRAMADOL HCL 50 MG PO TABS
50.0000 mg | ORAL_TABLET | Freq: Four times a day (QID) | ORAL | Status: DC | PRN
  Administered 2019-09-21 – 2019-09-22 (×4): 50 mg via ORAL
  Filled 2019-09-21 (×4): qty 1

## 2019-09-21 NOTE — Progress Notes (Addendum)
Physical Therapy Evaluation    Clinical Impression statement: Patient able to ambulate 100' with rolling walker with a slow but steady gait pattern. He was able to complete a log role with minimal cuing. He was given a sheet with his back precautions. Acute therapy will continue to work with patient. He may benefit from home health at this time but may progress to where he does not need home services.    09/20/19 1200  PT Visit Information  Last PT Received On 09/20/19  Assistance Needed +1  History of Present Illness This  49 yo male  is s/p LUMBAR TWO-THREE POSTERIOR LUMBAR INTERBODY FUSION. He reports this is his 7th back surgery. PMH: sleep apnea, HTN, headache, GERD,   Precautions  Precautions Back  Precaution Booklet Issued Yes (comment)  Precaution Comments Pt able to state all 3 back precautions. Patient given an sheet. He has had 7 back surgeries prior   Restrictions  Weight Bearing Restrictions No  Home Living  Family/patient expects to be discharged to: Private residence  Living Arrangements Spouse/significant other  Available Help at Discharge Family;Available PRN/intermittently  Type of Home House  Home Access Stairs to enter  Entrance Stairs-Number of Steps [place] - 2 wheels  Additional Comments does not have a raised commode; three half steps onto his deck   Prior Function  Level of Independence Independent  Comments was active until his back pain increased  Communication  Communication No difficulties  Pain Assessment  Pain Assessment 0-10  Pain Score 7  Faces Pain Scale 6  Pain Location incisional  Pain Descriptors / Indicators Aching;Sore  Pain Intervention(s) Limited activity within patient's tolerance;Monitored during session;RN gave pain meds during session (patient given dilaudid )  Cognition  Arousal/Alertness Awake/alert  Behavior During Therapy WFL for tasks assessed/performed  Overall Cognitive Status  Within Functional Limits for tasks assessed  Upper Extremity Assessment  Upper Extremity Assessment Defer to OT evaluation  Lower Extremity Assessment  Lower Extremity Assessment Overall WFL for tasks assessed  Cervical / Trunk Assessment  Cervical / Trunk Assessment Normal  Bed Mobility  Overal bed mobility Needs Assistance  Bed Mobility Sit to Supine  Sit to supine Supervision  Sit to sidelying Supervision  General bed mobility comments used log roll with minimal cuig has perfromed with prior surgeriioes   Transfers  Overall transfer level Needs assistance  Equipment used Rolling walker (2 wheeled)  Transfers Sit to/from Stand  Sit to Stand Supervision  Ambulation/Gait  Ambulation/Gait assistance Supervision  Gait Distance (Feet) 100 Feet  Assistive device Rolling walker (2 wheeled)  Gait Pattern/deviations Step-to pattern  General Gait Details slow but steady gait pattenr. Patient reported fatigue with gait  Gait velocity decreased  Balance  Overall balance assessment Needs assistance  Sitting-balance support No upper extremity supported;Feet supported  Sitting balance-Leahy Scale Good  Standing balance support Bilateral upper extremity supported  Standing balance-Leahy Scale Poor  General Comments  General comments (skin integrity, edema, etc.) normal amount of drainage noted on back bandage. Nursing notified and will assess as well. Patient had bleeding the morning   PT - End of Session  Equipment Utilized During Treatment Gait belt  Activity Tolerance Patient tolerated treatment well  Patient left in chair;with call bell/phone within reach  Nurse Communication Mobility status  PT Assessment  PT Recommendation/Assessment Patient needs continued PT services  PT Visit Diagnosis Other abnormalities of gait and mobility (R26.89);Pain  Pain - Right/Left  (central )  Pain - part of body  (back )  PT Problem List Decreased strength;Decreased range of motion;Decreased  activity tolerance;Decreased mobility;Decreased knowledge of use of DME;Pain  PT Plan  PT Frequency (ACUTE ONLY) Min 5X/week  PT Treatment/Interventions (ACUTE ONLY) Gait training;DME instruction;Stair training;Functional mobility training;Therapeutic activities;Therapeutic exercise;Neuromuscular re-education;Patient/family education  AM-PAC PT "6 Clicks" Mobility Outcome Measure (Version 2)  Help needed turning from your back to your side while in a flat bed without using bedrails? 3  Help needed moving from lying on your back to sitting on the side of a flat bed without using bedrails? 3  Help needed moving to and from a bed to a chair (including a wheelchair)? 3  Help needed standing up from a chair using your arms (e.g., wheelchair or bedside chair)? 3  Help needed to walk in hospital room? 3  Help needed climbing 3-5 steps with a railing?  3  6 Click Score 18  Consider Recommendation of Discharge To: Home with [facility]  PT Recommendation  Follow Up Recommendations No PT follow up  PT equipment 3in1 (PT)  Individuals Consulted  Consulted and Agree with Results and Recommendations Patient  Acute Rehab PT Goals  Patient Stated Goal to go home tomorrow  PT Goal Formulation With patient  Time For Goal Achievement 09/27/19  Potential to Achieve Goals Good  PT Time Calculation  PT Start Time (ACUTE ONLY) 0930  PT Stop Time (ACUTE ONLY) 0948  PT Time Calculation (min) (ACUTE ONLY) 18 min  PT General Charges  $$ ACUTE PT VISIT 1 Visit  PT Evaluation  $PT Eval Moderate Complexity 1 Mod  Written Expression  Dominant Hand Right   Rolinda Roan, PT, DPT signing note for physical therapy evaluation completed by Carolyne Littles, PT on 09/20/2019.  Carolyne Littles PT DPT  09/22/2019   Rolinda Roan, PT, DPT Acute Rehabilitation Services Pager: [phone] Office: [phone]

## 2019-09-21 NOTE — Progress Notes (Signed)
Physical Therapy Treatment Patient Details Name: Troy Franklin MRN: [id number] DOB: 01/29/1971 Today's Date: 09/21/2019    History of Present Illness This  49 yo male  is s/p LUMBAR TWO-THREE POSTERIOR LUMBAR INTERBODY FUSION. He reports this is his 7th back surgery. PMH: sleep apnea, HTN    PT Comments    Pt progressing well with post-op mobility. He was able to demonstrate transfers and ambulation with gross supervision for safety, with occasional min guard assist when trialing SPC and no AD during gait training. Education was reinforced on precautions, brace application/wearing schedule, appropriate activity progression, and car transfer. Will continue to follow.      Follow Up Recommendations  No PT follow up     Equipment Recommendations  3in1 (PT)    Recommendations for Other Services       Precautions / Restrictions Precautions Precautions: Back Precaution Booklet Issued: Yes (comment) Precaution Comments: Precautions reviewed during functional mobility.  Required Braces or Orthoses:  (no brace needed order) Restrictions Weight Bearing Restrictions: No    Mobility  Bed Mobility Overal bed mobility: Needs Assistance Bed Mobility: Rolling;Sidelying to Sit Rolling: Supervision Sidelying to sit: Supervision       General bed mobility comments: Use of rail required and increased time taken for log roll. Upon sitting up pt reports increased pain in LLE.   Transfers Overall transfer level: Needs assistance Equipment used: Rolling walker (2 wheeled) Transfers: Sit to/from Stand Sit to Stand: Supervision         General transfer comment: VC's for hand placement on seated surface for safety.   Ambulation/Gait Ambulation/Gait assistance: Supervision;Min guard Gait Distance (Feet): 225 Feet Assistive device: Rolling walker (2 wheeled);Straight cane;None Gait Pattern/deviations: Decreased stride length;Trunk flexed;Step-through pattern Gait velocity:  decreased Gait velocity interpretation: <1.31 ft/sec, indicative of household ambulator General Gait Details: VC's for improved posture, closer walker proximity, and forward gaze. Pt moving extremely slow and reports that he feels he could improve his speed without the walker. Attempted without RW and pt was in fact able to ambulate faster without notable unsteadiness, however pt preferring UE support at this time. We also trialed the [facility], and pt again reports that he would like to stick with the RW for now.    Stairs             Wheelchair Mobility    Modified Rankin (Stroke Patients Only)       Balance Overall balance assessment: Needs assistance Sitting-balance support: No upper extremity supported;Feet supported Sitting balance-Leahy Scale: Good     Standing balance support: Bilateral upper extremity supported Standing balance-Leahy Scale: Poor                              Cognition Arousal/Alertness: Awake/alert Behavior During Therapy: WFL for tasks assessed/performed Overall Cognitive Status: Within Functional Limits for tasks assessed                                        Exercises      General Comments        Pertinent Vitals/Pain Pain Assessment: Faces Faces Pain Scale: Hurts whole lot Pain Location: pt reports occasional "nerve pain"  Pain Descriptors / Indicators: Aching;Sore;Operative site guarding Pain Intervention(s): Limited activity within patient's tolerance;Monitored during session;Repositioned    Home Living  Prior Function            PT Goals (current goals can now be found in the care plan section) Acute Rehab PT Goals Patient Stated Goal: to have a bowel movement PT Goal Formulation: With patient Time For Goal Achievement: 09/27/19 Potential to Achieve Goals: Good Progress towards PT goals: Progressing toward goals    Frequency    Min 5X/week      PT Plan Current  plan remains appropriate    Co-evaluation              AM-PAC PT "6 Clicks" Mobility   Outcome Measure  Help needed turning from your back to your side while in a flat bed without using bedrails?: A Little Help needed moving from lying on your back to sitting on the side of a flat bed without using bedrails?: A Little Help needed moving to and from a bed to a chair (including a wheelchair)?: A Little Help needed standing up from a chair using your arms (e.g., wheelchair or bedside chair)?: A Little Help needed to walk in hospital room?: A Little Help needed climbing 3-5 steps with a railing? : A Little 6 Click Score: 18    End of Session Equipment Utilized During Treatment: Gait belt Activity Tolerance: Patient tolerated treatment well Patient left: in chair;with call bell/phone within reach Nurse Communication: Mobility status PT Visit Diagnosis: Other abnormalities of gait and mobility (R26.89);Pain Pain - Right/Left:  (central ) Pain - part of body:  (back )     Time: 4585-9292 PT Time Calculation (min) (ACUTE ONLY): 23 min  Charges:  $Gait Training: 23-37 mins                     Troy Franklin, PT, DPT Acute Rehabilitation Services Pager: [phone] Office: [phone]    Troy Franklin 09/21/2019, 3:23 PM

## 2019-09-21 NOTE — Progress Notes (Signed)
Subjective: Patient reports continued soreness in low back and constipation. No acute events over night. Pain appears to be under better control.   Objective: Vital signs in last 24 hours: Temp:  [98.3 F (36.8 C)-99.1 F (37.3 C)] 99.1 F (37.3 C) (07/11 0809) Pulse Rate:  [64-93] 93 (07/11 0809) Resp:  [16-20] 16 (07/11 0809) BP: (121-154)/(63-88) 149/77 (07/11 0809) SpO2:  [90 %-98 %] 98 % (07/11 0809)  Intake/Output from previous day: 07/10 0701 - 07/11 0700 In: 808.6 [P.O.:690; I.V.:118.6] Out: 1525 [Urine:1525] Intake/Output this shift: Total I/O In: -  Out: 200 [Urine:200]  Physical Exam: Strength full in both lower extremities.  Patient is quite sore in back. Dressing is dry and intact with a small amount of marked drainage in the middle.   Lab Results: No results for input(s): WBC, HGB, HCT, PLT in the last 72 hours. BMET No results for input(s): NA, K, CL, CO2, GLUCOSE, BUN, CREATININE, CALCIUM in the last 72 hours.  Studies/Results: DG Lumbar Spine 2-3 Views  Result Date: 09/19/2019 CLINICAL DATA:  L2-3 PLIF. FLUOROSCOPY TIME:  1 minutes 4 seconds. Images: 3 EXAM: LUMBAR SPINE - 2-3 VIEW COMPARISON:  March 11, 2017 FINDINGS: Throughout the study, pedicle screws seen previously at L4 have been removed. A disc spacer device is seen at L4-5, stable. The disc spacer device at L3-4 is stable. There is a new disc spacer device at L2-3. Pedicle screws remain at L3. New pedicle screws are seen at L2. IMPRESSION: Hardware revisions/placement as above. Electronically Signed   By: Dorise Bullion III M.D   On: 09/19/2019 15:42   DG C-Arm 1-60 Min  Result Date: 09/19/2019 CLINICAL DATA:  L2-3 PLIF. FLUOROSCOPY TIME:  1 minutes 4 seconds. Images: 3 EXAM: LUMBAR SPINE - 2-3 VIEW COMPARISON:  March 11, 2017 FINDINGS: Throughout the study, pedicle screws seen previously at L4 have been removed. A disc spacer device is seen at L4-5, stable. The disc spacer device at L3-4 is  stable. There is a new disc spacer device at L2-3. Pedicle screws remain at L3. New pedicle screws are seen at L2. IMPRESSION: Hardware revisions/placement as above. Electronically Signed   By: Dorise Bullion III M.D   On: 09/19/2019 15:43    Assessment/Plan: Patient is doing well.  Mobilize today.  Continue analgesics for pain control. Will add Miralax. Continue assessing for constipation and give PRN meds when appropriate.       LOS: 2 days    Peggyann Shoals, MD 09/21/2019, 9:21 AM

## 2019-09-22 MED ORDER — TIZANIDINE HCL 4 MG PO TABS
4.0000 mg | ORAL_TABLET | Freq: Four times a day (QID) | ORAL | 0 refills | Status: DC | PRN
Start: 2019-09-22 — End: 2020-09-01

## 2019-09-22 MED ORDER — OXYCODONE HCL 5 MG PO TABS: 5.0000 mg | ORAL_TABLET | Freq: Four times a day (QID) | ORAL | 0 refills | Status: AC | PRN

## 2019-09-22 NOTE — Progress Notes (Signed)
Physical Therapy Treatment Patient Details Name: Troy Franklin MRN: [id number] DOB: 01/08/1971 Today's Date: 09/22/2019    History of Present Illness This  49 yo male  is s/p LUMBAR TWO-THREE POSTERIOR LUMBAR INTERBODY FUSION. He reports this is his 7th back surgery. PMH: sleep apnea, HTN    PT Comments    Pt progressing well with post-op mobility. He was able to demonstrate transfers and ambulation with gross supervision for safety to modified independence with RW or SPC for support. Education was reinforced on precautions, brace application/wearing schedule, appropriate activity progression, and car transfer. Will continue to follow.      Follow Up Recommendations  No PT follow up     Equipment Recommendations  3in1 (PT)    Recommendations for Other Services       Precautions / Restrictions Precautions Precautions: Back Precaution Booklet Issued: Yes (comment) Precaution Comments: Precautions reviewed during functional mobility.  Required Braces or Orthoses:  (no brace needed order) Restrictions Weight Bearing Restrictions: No    Mobility  Bed Mobility Overal bed mobility: Modified Independent Bed Mobility: Rolling;Sidelying to Sit Rolling: Modified independent (Device/Increase time) Sidelying to sit: Modified independent (Device/Increase time)       General bed mobility comments: Increased time but generally safe with transition to EOB.   Transfers Overall transfer level: Needs assistance Equipment used: Rolling walker (2 wheeled) Transfers: Sit to/from Stand Sit to Stand: Supervision         General transfer comment: Pt demonstrated proper hand placement on seated surface for safety. No assist required but increased time taken to achieve full stand. Pt appears guarded and mildly painful.   Ambulation/Gait Ambulation/Gait assistance: Supervision Gait Distance (Feet): 300 Feet Assistive device: Rolling walker (2 wheeled);Straight cane Gait  Pattern/deviations: Decreased stride length;Trunk flexed;Step-through pattern Gait velocity: decreased Gait velocity interpretation: <1.8 ft/sec, indicate of risk for recurrent falls General Gait Details: VC's for improved posture, closer walker proximity, and forward gaze. Pt ambulated to our rehab gym with the RW for stair training and then switched to the [facility] for ambulation back to the room. He reports feeling more comfortable and confident with the [facility] today.    Stairs Stairs: Yes Stairs assistance: Supervision Stair Management: One rail Right;Step to pattern;Forwards Number of Stairs: 5 General stair comments: Pt demonstrated good sequencing and safety on the stairs.    Wheelchair Mobility    Modified Rankin (Stroke Patients Only)       Balance Overall balance assessment: Needs assistance Sitting-balance support: No upper extremity supported;Feet supported Sitting balance-Leahy Scale: Good     Standing balance support: Bilateral upper extremity supported Standing balance-Leahy Scale: Fair Standing balance comment: statically                            Cognition Arousal/Alertness: Awake/alert Behavior During Therapy: WFL for tasks assessed/performed Overall Cognitive Status: Within Functional Limits for tasks assessed                                        Exercises      General Comments        Pertinent Vitals/Pain Pain Assessment: Faces Faces Pain Scale: Hurts whole lot Pain Location: pt reports occasional "nerve pain"  Pain Descriptors / Indicators: Aching;Sore;Operative site guarding Pain Intervention(s): Limited activity within patient's tolerance;Monitored during session;Repositioned    Home Living  Prior Function            PT Goals (current goals can now be found in the care plan section) Acute Rehab PT Goals Patient Stated Goal: to have a bowel movement PT Goal Formulation: With  patient Time For Goal Achievement: 09/27/19 Potential to Achieve Goals: Good Progress towards PT goals: Progressing toward goals    Frequency    Min 5X/week      PT Plan Current plan remains appropriate    Co-evaluation              AM-PAC PT "6 Clicks" Mobility   Outcome Measure  Help needed turning from your back to your side while in a flat bed without using bedrails?: None Help needed moving from lying on your back to sitting on the side of a flat bed without using bedrails?: None Help needed moving to and from a bed to a chair (including a wheelchair)?: None Help needed standing up from a chair using your arms (e.g., wheelchair or bedside chair)?: None Help needed to walk in hospital room?: None Help needed climbing 3-5 steps with a railing? : A Little 6 Click Score: 23    End of Session Equipment Utilized During Treatment: Gait belt Activity Tolerance: Patient tolerated treatment well Patient left: in chair;with call bell/phone within reach Nurse Communication: Mobility status PT Visit Diagnosis: Other abnormalities of gait and mobility (R26.89);Pain Pain - Right/Left:  (central ) Pain - part of body:  (back )     Time: 3557-3220 PT Time Calculation (min) (ACUTE ONLY): 25 min  Charges:  $Gait Training: 23-37 mins                     Rolinda Roan, PT, DPT Acute Rehabilitation Services Pager: [phone] Office: [phone]    Thelma Comp 09/22/2019, 11:05 AM

## 2019-09-22 NOTE — TOC Transition Note (Signed)
Transition of Care [facility]) - CM/SW Discharge Note   Patient Details  Name: Troy Franklin MRN: [id number] Date of Birth: 03/16/1970  [facility] of [facility] CM/SW Contact:  Pollie Friar, RN Phone Number: 09/22/2019, 11:07 AM   Clinical Narrative:    Pt discharging home with self care. No f/u per PT/OT.  Pt to have cane and 3 in 1 delivered to the room per [employer].  Pt has supervision at home and transportation to home.    Final next level of care: Home/Self Care Barriers to Discharge: No Barriers Identified   Patient Goals and CMS Choice     Choice offered to / list presented to : Patient  Discharge Placement                       Discharge Plan and Services                DME Arranged: Kasandra Knudsen, 3-N-1 DME Agency: [employer] Date DME Agency Contacted: 09/22/19   Representative spoke with at DME Agency: Zack            Social Determinants of Health ([place]) Interventions     Readmission Risk Interventions No flowsheet data found.

## 2019-09-22 NOTE — Discharge Summary (Signed)
Patient ID: HRIDAY STAI, male   DOB: [birth year], 49 y.o.   MRN: [id number] Physician Discharge Summary  Patient ID: NEEKO PHARO MRN: [id number] DOB/AGE: June 24, 1970 49 y.o.  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 Schriefer was admitted and taken to the hospital for an uncomplicated lumbar arthrodesis with pedicle screw fixation. Post op he had complaints of pain in his lower back. He is ambulating, voiding, and tolerating a regular diet. Strength is full in the lower extremities. Wound is clean, dry, no signs of infection  Treatments: surgery: LUMBAR TWO-THREE POSTERIOR LUMBAR INTERBODY FUSION [employer] [place] cages packed with autograft morsels 11x63mm x2 cages nonsegmental pedicle screw fixation L2/3(nuvasive relign) L4 screw removal bilateral L2 laminectomy and inferior facetecomy far in excess of the needed exposure for a PLIF  Discharge Exam: Blood pressure 138/76, pulse 70, temperature 98.5 F (36.9 C), temperature source Oral, resp. rate 18, height 5\' 7"  (1.702 m), weight 107.6 kg, SpO2 93 %. General appearance: alert, cooperative, appears stated age and mild distress  Disposition:  SPONDYLOLISTHESIS, LUMBAR REGION     Signed: Ashok Pall 09/22/2019, 3:13 PM

## 2019-09-22 NOTE — Progress Notes (Signed)
Patient stated PT  Had him up walking in room and sat up in chair for 20 min and now back in bed. Rates pain a 2 at this time.

## 2019-10-01 ENCOUNTER — Ambulatory Visit (HOSPITAL_COMMUNITY): Payer: Managed Care, Other (non HMO) | Admitting: Physical Therapy

## 2020-01-02 ENCOUNTER — Ambulatory Visit (HOSPITAL_COMMUNITY): Payer: Managed Care, Other (non HMO) | Attending: Neurosurgery | Admitting: Physical Therapy

## 2020-01-02 ENCOUNTER — Encounter (HOSPITAL_COMMUNITY): Payer: Self-pay | Admitting: Physical Therapy

## 2020-01-02 ENCOUNTER — Other Ambulatory Visit: Payer: Self-pay

## 2020-01-02 DIAGNOSIS — M546 Pain in thoracic spine: Secondary | ICD-10-CM | POA: Diagnosis present

## 2020-01-02 DIAGNOSIS — M542 Cervicalgia: Secondary | ICD-10-CM | POA: Diagnosis present

## 2020-01-02 NOTE — Therapy (Signed)
[place], [place], [address] Phone: [phone]   Fax:  [phone]  Physical Therapy Evaluation  Patient Details  Name: Troy Franklin MRN: [id number] Date of Birth: 16-Jul-1970 Referring Provider (PT):  Ashok Pall MD   Encounter Date: 01/02/2020   PT End of Session - 01/02/20 1135    Visit Number 1    Number of Visits 12    Date for PT Re-Evaluation 02/27/20    Authorization Type cigna managed - no auth, VL 30 with 0 used    Authorization - Visit Number 1    Authorization - Number of Visits 30    PT Start Time 8016    PT Stop Time 1210    PT Time Calculation (min) 35 min           Past Medical History:  Diagnosis Date   Arthritis    lumbar   Complication of anesthesia    difficulty waking up- sleepiness lasted longer than other times.    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   Hypertension    Loud breathing during sleep    snoring, states the sleep study was WNL, done betwn 1995-2005   PONV (postoperative nausea and vomiting)    Sleep apnea    Cpap   Spondylolisthesis of lumbar region     Past Surgical History:  Procedure Laterality Date   ANKLE FRACTURE SURGERY     right - pins and screws   BACK SURGERY     CERVICAL FUSION  2013   CERVICAL FUSION     2007 & 2013   [place]   hydrocele   KNEE LIGAMENT RECONSTRUCTION     right    LUMBAR LAMINECTOMY/DECOMPRESSION MICRODISCECTOMY N/A 10/10/2017   Procedure: Lumbar Three-Four Laminectomy;  Surgeon: Ashok Pall, MD;  Location: [facility];  Service: Neurosurgery;  Laterality: N/A;   VASECTOMY      There were no vitals filed for this visit.    Subjective Assessment - 01/02/20 1135    Subjective States that he has degeneration  in his spine, he has had 2 fusions in his neck and 3 surgeries in his lower back. States since his most recent surgery (July 2021 on his low  back)  he has had difficulty sleeping secondary to pain in his neck. Reports he cant get comfortable at night. States he sleeps for 5-6 hours max secondary to pain. States he has discomfort all the time. States he would love to be pain free. States he works at [employer] and tries to make sure his posture is good and changes postures frequent. States he is just overall uncomfortable. States he is not taking any medications for back or neck at this time. States pain is at the base of the neck and slightly off to the side. States that when focusing on something his neck feels like it is going to lock up on him and then pops.  Current discomfort right now is 3/10, states it never goes to a 10  but it is constant pain.  State she has tried multiple pillows with no benefits. States his bed inclines and he has tried multiple positions. States that his thin pillow seems to be the most comfortable but still not great. States he typically starts on side for sleep and then he tosses and turns.    Pertinent History hx of 2 cervical fusions and total has  had 7 back surgeries- most recent lumbar fusion 09/19/19              [facility] PT Assessment - 01/02/20 0001      Assessment   Medical Diagnosis cervical spondylosis and radiculopathy    Referring Provider (PT)  Ashok Pall MD    Prior Therapy yes for back and neck       Balance Screen   Has the patient fallen in the past 6 months No      Prior Function   Level of Independence Independent    Vocation Requirements sits for work       Cognition   Overall Cognitive Status Within Functional Limits for tasks assessed      Observation/Other Assessments   Focus on Therapeutic Outcomes (FOTO)  43.24% function       ROM / Strength   AROM / PROM / Strength AROM;Strength      AROM   AROM Assessment Site Cervical;Shoulder    Cervical Flexion 22   pulling in back    Cervical Extension 18   feels stuck    Cervical - Right Rotation 34   stuck no pain   Cervical -  Left Rotation 38   no increase in pain just stuck.      Palpation   Spinal mobility hypomobility noted with thoracic and cervical spine    Palpation comment tenderness to palpation along thoracic paraspinals, mid rhomboids      Special Tests   Other special tests traction cervical - manually performed - felt good                      Objective measurements completed on examination: See above findings.       [place] Adult PT Treatment/Exercise - 01/02/20 0001      Exercises   Exercises Neck      Neck Exercises: Standing   Other Standing Exercises self mobilization with tennis ball to mid back 5 minutes      Neck Exercises: Supine   Other Supine Exercise protraction x15 2" holds B                   PT Education - 01/02/20 1208    Education Details on current condition, plan of care. On following up with MD prior to purchasing inflatable traction unit for cervical spine    Person(s) Educated Patient    Methods Explanation    Comprehension Verbalized understanding            PT Short Term Goals - 01/02/20 1216      PT SHORT TERM GOAL #1   Title Patient will report at least 25% improvement in overall symptoms and/or function to demonstrate improved functional mobility    Time 4    Period Weeks    Status New    Target Date 01/30/20      PT SHORT TERM GOAL #2   Title Patient will be independent in self management strategies to improve quality of life and functional outcomes.    Time 4    Period Weeks    Status New    Target Date 01/30/20             PT Long Term Goals - 01/02/20 1217      PT LONG TERM GOAL #1   Title Patient will report at least 50% improvement in overall symptoms and/or function to demonstrate improved functional mobility    Time 8  Period Weeks    Status New    Target Date 02/27/20      PT LONG TERM GOAL #2   Title Patient will report ability to stay asleep without being woken up by neck pain to improve sleep  quality    Time 8    Period Weeks    Target Date 02/27/20      PT LONG TERM GOAL #3   Title Patient will report pain to no longer be constant pain (in base of neck) to improve QOL    Time 8    Period Weeks    Status New    Target Date 02/27/20                  Plan - 01/02/20 1209    Clinical Impression Statement Patient present to therapy with complaints of thoracic/cervical pain most notably at night when trying to sleep. Hypomobility noted in thoracic spine and ribs. Pain primary along right upper thoracic. Patient educated in current presentation and plan moving forward. Patient would benefit from skilled physical therapy to focus on improving pain, sleep quality and overall mobility.    Personal Factors and Comorbidities Comorbidity 1;Comorbidity 2    Comorbidities history of 7 spinal surgeries - 2 neck fusions and 5 lumbar surgeries -    Examination-Activity Limitations Bed Mobility;Reach Overhead;Lift;Sleep    Examination-Participation Restrictions Cleaning    Stability/Clinical Decision Making Stable/Uncomplicated    Clinical Decision Making Low    Rehab Potential Good    PT Frequency --   1-2x/week for total of 12 visits over 8 week certification   PT Duration 8 weeks    PT Treatment/Interventions ADLs/Self Care Home Management;Therapeutic activities;Therapeutic exercise;Passive range of motion;Dry needling;Manual techniques;Electrical Stimulation;Cryotherapy;Moist Heat;Neuromuscular re-education;Patient/family education;Joint Manipulations    PT Next Visit Plan focus on thoracic mobility - spine and rib mobilizations,  scapular mobility - cervical spine with 2 fusions, focus on thoracic spinal mobility (lumbar spine also with 2 fusions)    PT Home Exercise Plan scapular protraction, self mobilization    Consulted and Agree with Plan of Care Patient           Patient will benefit from skilled therapeutic intervention in order to improve the following deficits and  impairments:  Pain, Decreased range of motion, Decreased mobility, Decreased activity tolerance, Hypomobility  Visit Diagnosis: Cervicalgia  Pain in thoracic spine     Problem List Patient Active Problem List   Diagnosis Date Noted   Lumbar stenosis with neurogenic claudication 10/10/2017   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 [place], [place], [address] Phone: [phone]   Fax:  [phone]  Name: Troy Franklin MRN: [id number] Date of Birth: 1970-12-09

## 2020-01-20 ENCOUNTER — Encounter (HOSPITAL_COMMUNITY): Payer: Self-pay | Admitting: Physical Therapy

## 2020-01-20 ENCOUNTER — Ambulatory Visit (HOSPITAL_COMMUNITY): Payer: Managed Care, Other (non HMO) | Attending: Neurosurgery | Admitting: Physical Therapy

## 2020-01-20 ENCOUNTER — Other Ambulatory Visit: Payer: Self-pay

## 2020-01-20 DIAGNOSIS — M542 Cervicalgia: Secondary | ICD-10-CM | POA: Insufficient documentation

## 2020-01-20 DIAGNOSIS — M546 Pain in thoracic spine: Secondary | ICD-10-CM | POA: Insufficient documentation

## 2020-01-20 NOTE — Therapy (Signed)
[place], [place], [address] Phone: [phone]   Fax:  [phone]  Physical Therapy Treatment  Patient Details  Name: Troy Franklin MRN: [id number] Date of Birth: 1970-04-25 Referring Provider (PT):  Ashok Pall MD   Encounter Date: 01/20/2020   PT End of Session - 01/20/20 1131    Visit Number 2    Number of Visits 12    Date for PT Re-Evaluation 02/27/20    Authorization Type cigna managed - no auth, VL 30 with 0 used    Authorization - Visit Number 2    Authorization - Number of Visits 30    PT Start Time 2263    PT Stop Time 1209    PT Time Calculation (min) 38 min           Past Medical History:  Diagnosis Date  . Arthritis    lumbar  . Complication of anesthesia    difficulty waking up- sleepiness lasted longer than other times.   [name] GERD (gastroesophageal reflux disease)   . 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 Hypertension   . Loud breathing during sleep    snoring, states the sleep study was WNL, done betwn 1995-2005  . PONV (postoperative nausea and vomiting)   . Sleep apnea    Cpap  . Spondylolisthesis of lumbar region     Past Surgical History:  Procedure Laterality Date  . ANKLE FRACTURE SURGERY     right - pins and screws  . BACK SURGERY    . CERVICAL FUSION  2013  . CERVICAL FUSION     2007 & 2013  . HERNIA REPAIR  1974   hydrocele  . KNEE LIGAMENT RECONSTRUCTION     right   . LUMBAR LAMINECTOMY/DECOMPRESSION MICRODISCECTOMY N/A 10/10/2017   Procedure: Lumbar Three-Four Laminectomy;  Surgeon: Ashok Pall, MD;  Location: [facility];  Service: Neurosurgery;  Laterality: N/A;  . VASECTOMY      There were no vitals filed for this visit.   Subjective Assessment - 01/20/20 1137    Subjective States that he feels about the same. States that with the stretch he had a couple of cramps. States the tennis ball didn't hurt but felt like it could of helped. States he sneezed  the other day and felt like he whiplashed his neck and had severe pain in his neck and chest which lasted a few minutes. Current pain is noted as 3/10 pain in the neck. Described as dull/achy.    Pertinent History hx of 2 cervical fusions and total has had 7 back surgeries- most recent lumbar fusion 09/19/19    Currently in Pain? Yes    Pain Score 3     Pain Location Neck    Pain Orientation Posterior    Pain Descriptors / Indicators Aching;Dull              [facility] PT Assessment - 01/20/20 0001      Assessment   Medical Diagnosis cervical spondylosis and radiculopathy    Referring Provider (PT)  Ashok Pall MD                         [facility] Adult PT Treatment/Exercise - 01/20/20 0001      Neck Exercises: Standing   Neck Retraction --   x15 5" holds   Other Standing Exercises pec stretch x5 10" holds B       Neck Exercises:  Supine   Other Supine Exercise scapular protraction x15 5" holds       Neck Exercises: Prone   Other Prone Exercise scapular retraction with shoulder extension 2x10 5" holds B       Manual Therapy   Manual Therapy Joint mobilization;Soft tissue mobilization    Manual therapy comments all manual interventions performed independently of other interventions.     Joint Mobilization PA to tspine T 1-8 grade II/II. UPA to ribs 3-6 grade II/III - performed bilaterally.     Soft tissue mobilization STM to B cervical paraspinals, suboccipitals and rhomboids - tolerated well - performed in prone                  PT Education - 01/20/20 1222    Education Details on posture, anatomy, goals of exercises, on anticipated progress    Person(s) Educated Patient    Methods Explanation    Comprehension Verbalized understanding            PT Short Term Goals - 01/02/20 1216      PT SHORT TERM GOAL #1   Title Patient will report at least 25% improvement in overall symptoms and/or function to demonstrate improved functional mobility    Time 4     Period Weeks    Status New    Target Date 01/30/20      PT SHORT TERM GOAL #2   Title Patient will be independent in self management strategies to improve quality of life and functional outcomes.    Time 4    Period Weeks    Status New    Target Date 01/30/20             PT Long Term Goals - 01/02/20 1217      PT LONG TERM GOAL #1   Title Patient will report at least 50% improvement in overall symptoms and/or function to demonstrate improved functional mobility    Time 8    Period Weeks    Status New    Target Date 02/27/20      PT LONG TERM GOAL #2   Title Patient will report ability to stay asleep without being woken up by neck pain to improve sleep quality    Time 8    Period Weeks    Target Date 02/27/20      PT LONG TERM GOAL #3   Title Patient will report pain to no longer be constant pain (in base of neck) to improve QOL    Time 8    Period Weeks    Status New    Target Date 02/27/20                 Plan - 01/20/20 1132    Clinical Impression Statement Focused on review of HEP. Answered all questions and concerns with sneezing causing pain and instructed patient to brace self with arms prior to sneeze. Educated patient on chronic  pain and multiple surgeries (fusions) and how changes will take time secondary to chronicity of condition. Added new exercises with no increase in symptoms. Thoracic spine and ribs very hypomobile. Will continue with current POC.    Personal Factors and Comorbidities Comorbidity 1;Comorbidity 2    Comorbidities history of 7 spinal surgeries - 2 neck fusions and 5 lumbar surgeries -    Examination-Activity Limitations Bed Mobility;Reach Overhead;Lift;Sleep    Examination-Participation Restrictions Cleaning    Stability/Clinical Decision Making Stable/Uncomplicated    Rehab Potential Good    PT Frequency --  1-2x/week for total of 12 visits over 8 week certification   PT Duration 8 weeks    PT Treatment/Interventions ADLs/Self  Care Home Management;Therapeutic activities;Therapeutic exercise;Passive range of motion;Dry needling;Manual techniques;Electrical Stimulation;Cryotherapy;Moist Heat;Neuromuscular re-education;Patient/family education;Joint Manipulations    PT Next Visit Plan focus on thoracic mobility - spine and rib mobilizations,  scapular mobility - cervical spine with 2 fusions, focus on thoracic spinal mobility (lumbar spine also with 2 fusions)    PT Home Exercise Plan scapular protraction, self mobilization; 11/9 prone shoulder extension , cervical retraction, pec stretch    Consulted and Agree with Plan of Care Patient           Patient will benefit from skilled therapeutic intervention in order to improve the following deficits and impairments:  Pain, Decreased range of motion, Decreased mobility, Decreased activity tolerance, Hypomobility  Visit Diagnosis: Cervicalgia  Pain in thoracic spine     Problem List Patient Active Problem List   Diagnosis Date Noted  . Lumbar stenosis with neurogenic claudication 10/10/2017  . Spondylolisthesis of L4-5 04/13/2012   12:28 PM, 01/20/20 Jerene Pitch, DPT Physical Therapy with [facility]  [phone] office  [place] [address] [place], [place], [address] Phone: [phone]   Fax:  [phone]  Name: MOHAMEDAMIN NIFONG MRN: [id number] Date of Birth: [birth year]

## 2020-01-22 ENCOUNTER — Other Ambulatory Visit: Payer: Self-pay

## 2020-01-22 ENCOUNTER — Ambulatory Visit (HOSPITAL_COMMUNITY): Payer: Managed Care, Other (non HMO) | Admitting: Physical Therapy

## 2020-01-22 DIAGNOSIS — M542 Cervicalgia: Secondary | ICD-10-CM | POA: Diagnosis not present

## 2020-01-22 DIAGNOSIS — M546 Pain in thoracic spine: Secondary | ICD-10-CM

## 2020-01-22 NOTE — Therapy (Addendum)
[place], [place], [address] Phone: [phone]   Fax:  [phone]  Physical Therapy Treatment  Patient Details  Name: RITCHIE KLEE MRN: [id number] Date of Birth: 1970-08-19 Referring Provider (PT):  Ashok Pall MD   Encounter Date: 01/22/2020   PT End of Session - 01/22/20 1223    Visit Number 3    Number of Visits 12    Date for PT Re-Evaluation 02/27/20    Authorization Type cigna managed - no auth, VL 30 with 0 used    Authorization - Visit Number 3    Authorization - Number of Visits 30    PT Start Time 3785    PT Stop Time 1210    PT Time Calculation (min) 32 min           Past Medical History:  Diagnosis Date  . Arthritis    lumbar  . Complication of anesthesia    difficulty waking up- sleepiness lasted longer than other times.   [name] GERD (gastroesophageal reflux disease)   . 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 Hypertension   . Loud breathing during sleep    snoring, states the sleep study was WNL, done betwn 1995-2005  . PONV (postoperative nausea and vomiting)   . Sleep apnea    Cpap  . Spondylolisthesis of lumbar region     Past Surgical History:  Procedure Laterality Date  . ANKLE FRACTURE SURGERY     right - pins and screws  . BACK SURGERY    . CERVICAL FUSION  2013  . CERVICAL FUSION     2007 & 2013  . HERNIA REPAIR  1974   hydrocele  . KNEE LIGAMENT RECONSTRUCTION     right   . LUMBAR LAMINECTOMY/DECOMPRESSION MICRODISCECTOMY N/A 10/10/2017   Procedure: Lumbar Three-Four Laminectomy;  Surgeon: Ashok Pall, MD;  Location: [facility];  Service: Neurosurgery;  Laterality: N/A;  . VASECTOMY      There were no vitals filed for this visit.   Subjective Assessment - 01/22/20 1216    Subjective pt states his pain is mostly upper thoracic/lower cervical (pointing to just below C7) and situated mostly to the left.  States it is hurting pretty good today.    Currently in  Pain? Yes    Pain Score 5     Pain Location Thoracic    Pain Orientation Right    Pain Descriptors / Indicators Aching;Sore;Penetrating                             [facility] Adult PT Treatment/Exercise - 01/22/20 0001      Neck Exercises: Standing   Neck Retraction 10 reps    Other Standing Exercises UE flexion stretch facing wall 10X, UE flexion with back against wall 10X    Other Standing Exercises corner stretch x5 10" holds B       Neck Exercises: Prone   Other Prone Exercise POE (miimally just getting thoracic stretch 20")      Manual Therapy   Manual Therapy Soft tissue mobilization    Manual therapy comments all manual interventions performed independently of other interventions.     Soft tissue mobilization STM to B cervical paraspinals, suboccipitals and rhomboids - tolerated well - performed in prone                  PT Education - 01/22/20 1217  Education Details Posture; discussed movement and extension throughout the day as he is mostly sitting at work.    Person(s) Educated Patient    Methods Explanation    Comprehension Verbalized understanding            PT Short Term Goals - 01/02/20 1216      PT SHORT TERM GOAL #1   Title Patient will report at least 25% improvement in overall symptoms and/or function to demonstrate improved functional mobility    Time 4    Period Weeks    Status New    Target Date 01/30/20      PT SHORT TERM GOAL #2   Title Patient will be independent in self management strategies to improve quality of life and functional outcomes.    Time 4    Period Weeks    Status New    Target Date 01/30/20             PT Long Term Goals - 01/02/20 1217      PT LONG TERM GOAL #1   Title Patient will report at least 50% improvement in overall symptoms and/or function to demonstrate improved functional mobility    Time 8    Period Weeks    Status New    Target Date 02/27/20      PT LONG TERM GOAL #2   Title  Patient will report ability to stay asleep without being woken up by neck pain to improve sleep quality    Time 8    Period Weeks    Target Date 02/27/20      PT LONG TERM GOAL #3   Title Patient will report pain to no longer be constant pain (in base of neck) to improve QOL    Time 8    Period Weeks    Status New    Target Date 02/27/20           Assessment:  Pt reports pain with prone exercises added last visit so instructed to hold these for now. Added forward wall stretch and corner stretch with only minimal discomfort completing. Focused session largely using manual technques to help reduce pain and dysfunction. Completed prone to lower cervical and thoracic region, rt>Lt. Multiple spasms bilaterally reduced with manual. Gentle POE just to utilize the thoracic region did not increase or lessen pain.  Plan: focus on thoracic mobility - spine and rib mobilizations, scapular mobility - cervical spine with 2 fusions, focus on thoracic spinal mobility (lumbar spine also with 2 fusions)       Patient will benefit from skilled therapeutic intervention in order to improve the following deficits and impairments:     Visit Diagnosis: No diagnosis found.     Problem List Patient Active Problem List   Diagnosis Date Noted  . Lumbar stenosis with neurogenic claudication 10/10/2017  . Spondylolisthesis of L4-5 04/13/2012   Teena Irani, PTA/CLT [phone]  Teena Irani 01/22/2020, 12:24 PM  [place], [place], [address] Phone: [phone]   Fax:  [phone]  Name: ROMMIE DUNN MRN: [id number] Date of Birth: [birth year]

## 2020-01-27 ENCOUNTER — Ambulatory Visit (HOSPITAL_COMMUNITY): Payer: Managed Care, Other (non HMO)

## 2020-01-29 ENCOUNTER — Other Ambulatory Visit: Payer: Self-pay

## 2020-01-29 ENCOUNTER — Encounter (HOSPITAL_COMMUNITY): Payer: Self-pay

## 2020-01-29 ENCOUNTER — Ambulatory Visit (HOSPITAL_COMMUNITY): Payer: Managed Care, Other (non HMO)

## 2020-01-29 DIAGNOSIS — M542 Cervicalgia: Secondary | ICD-10-CM

## 2020-01-29 DIAGNOSIS — M546 Pain in thoracic spine: Secondary | ICD-10-CM

## 2020-01-29 NOTE — Therapy (Signed)
[place], [place], [address] Phone: [phone]   Fax:  [phone]  Physical Therapy Treatment  Patient Details  Name: Troy Franklin MRN: [id number] Date of Birth: [birth year] Referring Provider (PT):  Ashok Pall MD   Encounter Date: 01/29/2020   PT End of Session - 01/29/20 1145    Visit Number 4    Number of Visits 12    Date for PT Re-Evaluation 02/27/20    Authorization Type cigna managed - no auth, VL 30 with 0 used    Authorization - Visit Number 4    Authorization - Number of Visits 30    PT Start Time 5027    PT Stop Time 1212    PT Time Calculation (min) 38 min    Activity Tolerance Patient tolerated treatment well    Behavior During Therapy [facility] for tasks assessed/performed           Past Medical History:  Diagnosis Date  . Arthritis    lumbar  . Complication of anesthesia    difficulty waking up- sleepiness lasted longer than other times.   [name] GERD (gastroesophageal reflux disease)   . 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 Hypertension   . Loud breathing during sleep    snoring, states the sleep study was WNL, done betwn 1995-2005  . PONV (postoperative nausea and vomiting)   . Sleep apnea    Cpap  . Spondylolisthesis of lumbar region     Past Surgical History:  Procedure Laterality Date  . ANKLE FRACTURE SURGERY     right - pins and screws  . BACK SURGERY    . CERVICAL FUSION  2013  . CERVICAL FUSION     2007 & 2013  . HERNIA REPAIR  1974   hydrocele  . KNEE LIGAMENT RECONSTRUCTION     right   . LUMBAR LAMINECTOMY/DECOMPRESSION MICRODISCECTOMY N/A 10/10/2017   Procedure: Lumbar Three-Four Laminectomy;  Surgeon: Ashok Pall, MD;  Location: [place];  Service: Neurosurgery;  Laterality: N/A;  . VASECTOMY      There were no vitals filed for this visit.   Subjective Assessment - 01/29/20 1136    Subjective Pt stated he has knot just under (point to C7) and reports  sharp pain today.  Current pain scale 4-5/10.    Pertinent History hx of 2 cervical fusions and total has had 7 back surgeries- most recent lumbar fusion 09/19/19    Currently in Pain? Yes    Pain Score 5     Pain Location Thoracic    Pain Orientation Right    Pain Descriptors / Indicators Sharp;Sore    Pain Type Chronic pain    Pain Onset More than a month ago    Pain Frequency Intermittent    Aggravating Factors  twisting while driving, bending over to pick up items, sitting/standing for prolonged period of time    Pain Relieving Factors not much              [facility] PT Assessment - 01/29/20 0001      Assessment   Medical Diagnosis cervical spondylosis and radiculopathy    Referring Provider (PT)  Ashok Pall MD    Next MD Visit 02/16/20    Prior Therapy yes for back and neck                          [facility] Adult PT Treatment/Exercise - 01/29/20  0001      Exercises   Exercises Neck      Neck Exercises: Seated   Neck Retraction 10 reps;3 secs    Shoulder Rolls 5 reps;Backwards    Shoulder Rolls Limitations up back and down    Other Seated Exercise 3D thoracic excursion without cervical flexion (limited movements) 3x guarded and limited by pain)      Neck Exercises: Prone   Upper Extremity Flexion with Stabilization Flexion   3 reps     Manual Therapy   Manual Therapy Soft tissue mobilization    Manual therapy comments all manual interventions performed independently of other interventions.     Soft tissue mobilization STM to B cervical/thoracic paraspinals, suboccipitals and rhomboids - tolerated well - performed in prone                    PT Short Term Goals - 01/02/20 1216      PT SHORT TERM GOAL #1   Title Patient will report at least 25% improvement in overall symptoms and/or function to demonstrate improved functional mobility    Time 4    Period Weeks    Status New    Target Date 01/30/20      PT SHORT TERM GOAL #2   Title Patient  will be independent in self management strategies to improve quality of life and functional outcomes.    Time 4    Period Weeks    Status New    Target Date 01/30/20             PT Long Term Goals - 01/02/20 1217      PT LONG TERM GOAL #1   Title Patient will report at least 50% improvement in overall symptoms and/or function to demonstrate improved functional mobility    Time 8    Period Weeks    Status New    Target Date 02/27/20      PT LONG TERM GOAL #2   Title Patient will report ability to stay asleep without being woken up by neck pain to improve sleep quality    Time 8    Period Weeks    Target Date 02/27/20      PT LONG TERM GOAL #3   Title Patient will report pain to no longer be constant pain (in base of neck) to improve QOL    Time 8    Period Weeks    Status New    Target Date 02/27/20                 Plan - 01/29/20 1221    Clinical Impression Statement Pt limited by pain this sessoin, very stiff, UT activativated and forward head posture as reports most comfortable.  Pt educated on importance of posutre to reduce stress on musculature of back.  Attempted thoracic mobility exercises with reports of increased neck and lumbar pain.  EOS with manual to reduce spasms and overall tightness with reports of relief during massage though pain scale same following.  Reports of feet tingling with manual thoracic paraspinals.  Most relaxing position with prone and UE overhead though did not tolerate well to seated thoracic extension with UE overhead.    Personal Factors and Comorbidities Comorbidity 1;Comorbidity 2    Comorbidities history of 7 spinal surgeries - 2 neck fusions and 5 lumbar surgeries -    Examination-Activity Limitations Bed Mobility;Reach Overhead;Lift;Sleep    Examination-Participation Restrictions Cleaning    Stability/Clinical Decision Making Stable/Uncomplicated  Clinical Decision Making Low    Rehab Potential Good    PT Frequency --    1-2x/week for total of 12 visits over 8 week   PT Duration 8 weeks    PT Treatment/Interventions ADLs/Self Care Home Management;Therapeutic activities;Therapeutic exercise;Passive range of motion;Dry needling;Manual techniques;Electrical Stimulation;Cryotherapy;Moist Heat;Neuromuscular re-education;Patient/family education;Joint Manipulations    PT Next Visit Plan focus on thoracic mobility - spine and rib mobilizations,  scapular mobility - cervical spine with 2 fusions, focus on thoracic spinal mobility (lumbar spine also with 2 fusions)    PT Home Exercise Plan scapular protraction, self mobilization; 11/9 prone shoulder extension , cervical retraction, pec stretch           Patient will benefit from skilled therapeutic intervention in order to improve the following deficits and impairments:  Pain, Decreased range of motion, Decreased mobility, Decreased activity tolerance, Hypomobility  Visit Diagnosis: Cervicalgia  Pain in thoracic spine     Problem List Patient Active Problem List   Diagnosis Date Noted  . Lumbar stenosis with neurogenic claudication 10/10/2017  . Spondylolisthesis of L4-5 04/13/2012   Ihor Austin, LPTA/CLT; CBIS [phone]  Aldona Lento 01/29/2020, 12:29 PM  [place] [address] [place], [place], [address] Phone: [phone]   Fax:  [phone]  Name: KEMONTE ULLMAN MRN: [id number] Date of Birth: May 21, 1970

## 2020-01-30 ENCOUNTER — Encounter (HOSPITAL_COMMUNITY): Payer: Self-pay | Admitting: Physical Therapy

## 2020-01-30 ENCOUNTER — Ambulatory Visit (HOSPITAL_COMMUNITY): Payer: Managed Care, Other (non HMO) | Admitting: Physical Therapy

## 2020-01-30 DIAGNOSIS — M542 Cervicalgia: Secondary | ICD-10-CM | POA: Diagnosis not present

## 2020-01-30 DIAGNOSIS — M546 Pain in thoracic spine: Secondary | ICD-10-CM

## 2020-01-30 NOTE — Therapy (Signed)
[place], [place], [address] Phone: [phone]   Fax:  [phone]  Physical Therapy Treatment  Patient Details  Name: Troy Franklin MRN: [id number] Date of Birth: 09/05/1970 Referring Provider (PT):  Ashok Pall MD   Encounter Date: 01/30/2020   PT End of Session - 01/30/20 1021    Visit Number 5    Number of Visits 12    Date for PT Re-Evaluation 02/27/20    Authorization Type cigna managed - no auth, VL 30 with 0 used    Authorization - Visit Number 5    Authorization - Number of Visits 30    PT Start Time 1005    PT Stop Time 1043    PT Time Calculation (min) 38 min    Activity Tolerance Patient tolerated treatment well    Behavior During Therapy [facility] for tasks assessed/performed           Past Medical History:  Diagnosis Date  . Arthritis    lumbar  . Complication of anesthesia    difficulty waking up- sleepiness lasted longer than other times.   [name] GERD (gastroesophageal reflux disease)   . 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 Hypertension   . Loud breathing during sleep    snoring, states the sleep study was WNL, done betwn 1995-2005  . PONV (postoperative nausea and vomiting)   . Sleep apnea    Cpap  . Spondylolisthesis of lumbar region     Past Surgical History:  Procedure Laterality Date  . ANKLE FRACTURE SURGERY     right - pins and screws  . BACK SURGERY    . CERVICAL FUSION  2013  . CERVICAL FUSION     2007 & 2013  . HERNIA REPAIR  1974   hydrocele  . KNEE LIGAMENT RECONSTRUCTION     right   . LUMBAR LAMINECTOMY/DECOMPRESSION MICRODISCECTOMY N/A 10/10/2017   Procedure: Lumbar Three-Four Laminectomy;  Surgeon: Ashok Pall, MD;  Location: [facility];  Service: Neurosurgery;  Laterality: N/A;  . VASECTOMY      There were no vitals filed for this visit.   Subjective Assessment - 01/30/20 1006    Subjective PT states that his pain is at a 4-5 which is normal for him.   He has increased pain with his HEP    Pertinent History hx of 2 cervical fusions and total has had 7 back surgeries- most recent lumbar fusion 09/19/19    Currently in Pain? Yes    Pain Score 5     Pain Location Neck    Pain Orientation Right    Pain Descriptors / Indicators Sore    Pain Type Chronic pain    Pain Onset More than a month ago    Pain Frequency Intermittent    Aggravating Factors  twisting and bending over.                             [facility] Adult PT Treatment/Exercise - 01/30/20 0001      Exercises   Exercises Neck      Neck Exercises: Seated   Other Seated Exercise postural positioning; scapular retraction x 10 B       Neck Exercises: Supine   Cervical Rotation Both;5 reps    Shoulder Flexion Both;10 reps    Shoulder Flexion Limitations dowel     Other Supine Exercise scapular retraction/protraction     Other  Supine Exercise decompression exercises 1-3      Manual Therapy   Manual Therapy Soft tissue mobilization;Manual Traction    Manual therapy comments all manual interventions performed independently of other interventions.     Soft tissue mobilization STM to B cervical/thoracic paraspinals, suboccipitals and rhomboids - tolerated well - performed in prone    Manual Traction to decrease pain                   PT Education - 01/30/20 1040    Education Details Importance of completing good body mechanics each and every time as pt completes a reverse sit up to go from sitting to supind    Person(s) Educated Patient    Methods Explanation    Comprehension Verbalized understanding            PT Short Term Goals - 01/02/20 1216      PT SHORT TERM GOAL #1   Title Patient will report at least 25% improvement in overall symptoms and/or function to demonstrate improved functional mobility    Time 4    Period Weeks    Status New    Target Date 01/30/20      PT SHORT TERM GOAL #2   Title Patient will be independent in self  management strategies to improve quality of life and functional outcomes.    Time 4    Period Weeks    Status New    Target Date 01/30/20             PT Long Term Goals - 01/02/20 1217      PT LONG TERM GOAL #1   Title Patient will report at least 50% improvement in overall symptoms and/or function to demonstrate improved functional mobility    Time 8    Period Weeks    Status New    Target Date 02/27/20      PT LONG TERM GOAL #2   Title Patient will report ability to stay asleep without being woken up by neck pain to improve sleep quality    Time 8    Period Weeks    Target Date 02/27/20      PT LONG TERM GOAL #3   Title Patient will report pain to no longer be constant pain (in base of neck) to improve QOL    Time 8    Period Weeks    Status New    Target Date 02/27/20                 Plan - 01/30/20 1021    Clinical Impression Statement Pt complains of increased pain with almost all exercises therefore therapist adjusted HEP to a very simple mobility program.  Pt has had 7 spinal surgeries and is limited in almost all spinal motions.    Personal Factors and Comorbidities Comorbidity 1;Comorbidity 2    Comorbidities history of 7 spinal surgeries - 2 neck fusions and 5 lumbar surgeries -    Examination-Activity Limitations Bed Mobility;Reach Overhead;Lift;Sleep    Examination-Participation Restrictions Cleaning    Stability/Clinical Decision Making Stable/Uncomplicated    Rehab Potential Good    PT Frequency --   1-2x/week for total of 12 visits over 8 week   PT Duration 8 weeks    PT Treatment/Interventions ADLs/Self Care Home Management;Therapeutic activities;Therapeutic exercise;Passive range of motion;Dry needling;Manual techniques;Electrical Stimulation;Cryotherapy;Moist Heat;Neuromuscular re-education;Patient/family education;Joint Manipulations    PT Next Visit Plan focus on thoracic mobility - spine and rib mobilizations,  scapular mobility - cervical  spine with 2 fusions, focus on thoracic spinal mobility (lumbar spine also with 2 fusions)    PT Home Exercise Plan scapular protraction, self mobilization; 11/9 prone shoulder extension , cervical retraction, pec stretch; 11/19: decompression ex 1-3; supine wand exercises and supine cervical rotation           Patient will benefit from skilled therapeutic intervention in order to improve the following deficits and impairments:  Pain, Decreased range of motion, Decreased mobility, Decreased activity tolerance, Hypomobility  Visit Diagnosis: Cervicalgia  Pain in thoracic spine     Problem List Patient Active Problem List   Diagnosis Date Noted  . Lumbar stenosis with neurogenic claudication 10/10/2017  . Spondylolisthesis of L4-5 04/13/2012   Rayetta Humphrey, PT CLT [phone] 01/30/2020, 10:47 AM  [facility] [address] [place], [place], [address] Phone: [phone]   Fax:  [phone]  Name: Troy Franklin MRN: [id number] Date of Birth: 1970/05/11

## 2020-02-02 ENCOUNTER — Encounter (HOSPITAL_COMMUNITY): Payer: Self-pay | Admitting: Physical Therapy

## 2020-02-02 ENCOUNTER — Other Ambulatory Visit: Payer: Self-pay

## 2020-02-02 ENCOUNTER — Ambulatory Visit (HOSPITAL_COMMUNITY): Payer: Managed Care, Other (non HMO) | Admitting: Physical Therapy

## 2020-02-02 DIAGNOSIS — M542 Cervicalgia: Secondary | ICD-10-CM | POA: Diagnosis not present

## 2020-02-02 DIAGNOSIS — M546 Pain in thoracic spine: Secondary | ICD-10-CM

## 2020-02-02 NOTE — Therapy (Signed)
[place], [place], [address] Phone: [phone]   Fax:  [phone]  Physical Therapy Treatment  Patient Details  Name: Troy Franklin MRN: [id number] Date of Birth: 1970/11/21 Referring Provider (PT):  Ashok Pall MD   Encounter Date: 02/02/2020   PT End of Session - 02/02/20 1134    Visit Number 6    Number of Visits 12    Date for PT Re-Evaluation 02/27/20    Authorization Type cigna managed - no auth, VL 30 with 0 used    Authorization - Visit Number 6    Authorization - Number of Visits 30    PT Start Time 0071    PT Stop Time 1213    PT Time Calculation (min) 38 min    Activity Tolerance Patient tolerated treatment well    Behavior During Therapy WFL for tasks assessed/performed           Past Medical History:  Diagnosis Date   Arthritis    lumbar   Complication of anesthesia    difficulty waking up- sleepiness lasted longer than other times.    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   Hypertension    Loud breathing during sleep    snoring, states the sleep study was WNL, done betwn 1995-2005   PONV (postoperative nausea and vomiting)    Sleep apnea    Cpap   Spondylolisthesis of lumbar region     Past Surgical History:  Procedure Laterality Date   ANKLE FRACTURE SURGERY     right - pins and screws   BACK SURGERY     CERVICAL FUSION  2013   CERVICAL FUSION     2007 & 2013   [place]   hydrocele   KNEE LIGAMENT RECONSTRUCTION     right    LUMBAR LAMINECTOMY/DECOMPRESSION MICRODISCECTOMY N/A 10/10/2017   Procedure: Lumbar Three-Four Laminectomy;  Surgeon: Ashok Pall, MD;  Location: [facility];  Service: Neurosurgery;  Laterality: N/A;   VASECTOMY      There were no vitals filed for this visit.   Subjective Assessment - 02/02/20 1136    Subjective Patient states his back is about the same. He continues to  have pain at the lower part of her neck. His new HEP didnt hurt.    Pertinent History hx of 2 cervical fusions and total has had 7 back surgeries- most recent lumbar fusion 09/19/19    Pain Score 5     Pain Location Neck    Pain Descriptors / Indicators Sore    Pain Type Chronic pain    Pain Onset More than a month ago                             [facility] Adult PT Treatment/Exercise - 02/02/20 0001      Neck Exercises: Standing   Other Standing Exercises scap retraction and depression with posterior support at wall 2x10      Neck Exercises: Seated   Other Seated Exercise thoracic extension over chair x 10 5 second holds      Manual Therapy   Manual Therapy Soft tissue mobilization;Manual Traction    Manual therapy comments all manual interventions performed independently of other interventions.     Joint Mobilization R and L upa T 1-T 4 bilateral grade II-III    Soft tissue mobilization STM to B  cervical/thoracic paraspinals, rhomboids - tolerated well - performed in prone                  PT Education - 02/02/20 1215    Education Details Patient educated on exercise mechanics and self STM with ball    Person(s) Educated Patient    Methods Explanation;Demonstration    Comprehension Verbalized understanding;Returned demonstration            PT Short Term Goals - 01/02/20 1216      PT SHORT TERM GOAL #1   Title Patient will report at least 25% improvement in overall symptoms and/or function to demonstrate improved functional mobility    Time 4    Period Weeks    Status New    Target Date 01/30/20      PT SHORT TERM GOAL #2   Title Patient will be independent in self management strategies to improve quality of life and functional outcomes.    Time 4    Period Weeks    Status New    Target Date 01/30/20             PT Long Term Goals - 01/02/20 1217      PT LONG TERM GOAL #1   Title Patient will report at least 50% improvement in overall  symptoms and/or function to demonstrate improved functional mobility    Time 8    Period Weeks    Status New    Target Date 02/27/20      PT LONG TERM GOAL #2   Title Patient will report ability to stay asleep without being woken up by neck pain to improve sleep quality    Time 8    Period Weeks    Target Date 02/27/20      PT LONG TERM GOAL #3   Title Patient will report pain to no longer be constant pain (in base of neck) to improve QOL    Time 8    Period Weeks    Status New    Target Date 02/27/20                 Plan - 02/02/20 1134    Clinical Impression Statement Patient tolerates mobilizations well with decrease in tissue tension but no change in symptoms following. Patient requires cueing for thoracic extension exercise and c/o chest stretching but no change in symptoms overall. Patient with good mechanics with retraction depression at wall for posterior support for postural cueing but he has difficulty with scapular depression. Patient with continued symptoms in R thoracic spine from T2-T3 region. Completed R UPA grade 3 with decrease in stiffness but continued symptoms. Educated patient on self STM with ball. Patient will continue to benefit from skilled physical therapy in order to reduce impairment and improve function.    Personal Factors and Comorbidities Comorbidity 1;Comorbidity 2    Comorbidities history of 7 spinal surgeries - 2 neck fusions and 5 lumbar surgeries -    Examination-Activity Limitations Bed Mobility;Reach Overhead;Lift;Sleep    Examination-Participation Restrictions Cleaning    Stability/Clinical Decision Making Stable/Uncomplicated    Rehab Potential Good    PT Frequency --   1-2x/week for total of 12 visits over 8 week   PT Duration 8 weeks    PT Treatment/Interventions ADLs/Self Care Home Management;Therapeutic activities;Therapeutic exercise;Passive range of motion;Dry needling;Manual techniques;Electrical Stimulation;Cryotherapy;Moist  Heat;Neuromuscular re-education;Patient/family education;Joint Manipulations    PT Next Visit Plan focus on thoracic mobility - spine and rib mobilizations,  scapular mobility -  cervical spine with 2 fusions, focus on thoracic spinal mobility (lumbar spine also with 2 fusions)    PT Home Exercise Plan scapular protraction, self mobilization; 11/9 prone shoulder extension , cervical retraction, pec stretch; 11/19: decompression ex 1-3; supine wand exercises and supine cervical rotation 11/22 scap retraction depression           Patient will benefit from skilled therapeutic intervention in order to improve the following deficits and impairments:  Pain, Decreased range of motion, Decreased mobility, Decreased activity tolerance, Hypomobility  Visit Diagnosis: Cervicalgia  Pain in thoracic spine     Problem List Patient Active Problem List   Diagnosis Date Noted   Lumbar stenosis with neurogenic claudication 10/10/2017   Spondylolisthesis of L4-5 04/13/2012   12:19 PM, 02/02/20 Mearl Latin PT, DPT Physical Therapist at [facility] [address] [place], [place], [address] Phone: [phone]   Fax:  [phone]  Name: KAYVEN ALDACO MRN: [id number] Date of Birth: Mar 07, 1971

## 2020-02-02 NOTE — Patient Instructions (Signed)
Access Code: [email or web address] URL: [email or web address] Date: 02/02/2020 Prepared by: Mitzi Hansen Laroy Mustard  Exercises Seated Scapular Retraction - 1 x daily - 7 x weekly - 2 sets - 10 reps

## 2020-02-03 ENCOUNTER — Encounter (HOSPITAL_COMMUNITY): Payer: Self-pay | Admitting: Physical Therapy

## 2020-02-03 ENCOUNTER — Ambulatory Visit (HOSPITAL_COMMUNITY): Payer: Managed Care, Other (non HMO) | Admitting: Physical Therapy

## 2020-02-03 DIAGNOSIS — M542 Cervicalgia: Secondary | ICD-10-CM

## 2020-02-03 DIAGNOSIS — M546 Pain in thoracic spine: Secondary | ICD-10-CM

## 2020-02-03 NOTE — Therapy (Signed)
[place], [place], [address] Phone: [phone]   Fax:  [phone]  Physical Therapy Treatment  Patient Details  Name: OWYNN MOSQUEDA MRN: [id number] Date of Birth: 02/17/1971 Referring Provider (PT):  Ashok Pall MD   Encounter Date: 02/03/2020   PT End of Session - 02/03/20 1356    Visit Number 7    Number of Visits 12    Date for PT Re-Evaluation 02/27/20    Authorization Type cigna managed - no auth, VL 30 with 0 used    Authorization - Visit Number 7    Authorization - Number of Visits 30    PT Start Time 5809    PT Stop Time 1355    PT Time Calculation (min) 39 min    Activity Tolerance Patient tolerated treatment well    Behavior During Therapy [facility] for tasks assessed/performed           Past Medical History:  Diagnosis Date  . Arthritis    lumbar  . Complication of anesthesia    difficulty waking up- sleepiness lasted longer than other times.   [name] GERD (gastroesophageal reflux disease)   . 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 Hypertension   . Loud breathing during sleep    snoring, states the sleep study was WNL, done betwn 1995-2005  . PONV (postoperative nausea and vomiting)   . Sleep apnea    Cpap  . Spondylolisthesis of lumbar region     Past Surgical History:  Procedure Laterality Date  . ANKLE FRACTURE SURGERY     right - pins and screws  . BACK SURGERY    . CERVICAL FUSION  2013  . CERVICAL FUSION     2007 & 2013  . HERNIA REPAIR  1974   hydrocele  . KNEE LIGAMENT RECONSTRUCTION     right   . LUMBAR LAMINECTOMY/DECOMPRESSION MICRODISCECTOMY N/A 10/10/2017   Procedure: Lumbar Three-Four Laminectomy;  Surgeon: Ashok Pall, MD;  Location: [facility];  Service: Neurosurgery;  Laterality: N/A;  . VASECTOMY      There were no vitals filed for this visit.   Subjective Assessment - 02/03/20 1317    Subjective Pt states that his back and neck pain are about the same.  He  is doing his exercises.    Pertinent History hx of 2 cervical fusions and total has had 7 back surgeries- most recent lumbar fusion 09/19/19    Currently in Pain? Yes    Pain Score 5     Pain Location Neck    Pain Orientation Right    Pain Descriptors / Indicators Aching    Pain Type Chronic pain    Pain Onset More than a month ago    Pain Frequency Intermittent    Aggravating Factors  turning, looking    Pain Relieving Factors nothingn                             [facility] Adult PT Treatment/Exercise - 02/03/20 0001      Exercises   Exercises Neck      Neck Exercises: Theraband   Scapula Retraction 10 reps;Green    Shoulder Extension 10 reps;Green    Rows 10 reps;Green      Neck Exercises: Seated   Neck Retraction 10 reps    Neck Retraction Limitations scapular retraction x 1-0    Shoulder Flexion Both;10 reps    Shoulder  Flexion Weights (lbs) dowel.     Other Seated Exercise cervical and thoracic excursion x 3 each       Neck Exercises: Prone   Other Prone Exercise all 4 mad cat old horse x 3      Manual Therapy   Manual Therapy Soft tissue mobilization;Manual Traction    Manual therapy comments all manual interventions performed independently of other interventions.     Manual Traction to decrease pain                     PT Short Term Goals - 01/02/20 1216      PT SHORT TERM GOAL #1   Title Patient will report at least 25% improvement in overall symptoms and/or function to demonstrate improved functional mobility    Time 4    Period Weeks    Status New    Target Date 01/30/20      PT SHORT TERM GOAL #2   Title Patient will be independent in self management strategies to improve quality of life and functional outcomes.    Time 4    Period Weeks    Status New    Target Date 01/30/20             PT Long Term Goals - 01/02/20 1217      PT LONG TERM GOAL #1   Title Patient will report at least 50% improvement in overall symptoms  and/or function to demonstrate improved functional mobility    Time 8    Period Weeks    Status New    Target Date 02/27/20      PT LONG TERM GOAL #2   Title Patient will report ability to stay asleep without being woken up by neck pain to improve sleep quality    Time 8    Period Weeks    Target Date 02/27/20      PT LONG TERM GOAL #3   Title Patient will report pain to no longer be constant pain (in base of neck) to improve QOL    Time 8    Period Weeks    Status New    Target Date 02/27/20                 Plan - 02/03/20 1356    Clinical Impression Statement PT continues to demonstrate resistance to motion due to increased pain.  Added cervical excursions as well as mad cat/old horse to improve mobility.  Therapist also added  t-band postural exercises to improve stability.  Improved tightness of cervical mm with traction decreasing pain.    Personal Factors and Comorbidities Comorbidity 1;Comorbidity 2    Comorbidities history of 7 spinal surgeries - 2 neck fusions and 5 lumbar surgeries -    Examination-Activity Limitations Bed Mobility;Reach Overhead;Lift;Sleep    Examination-Participation Restrictions Cleaning    Stability/Clinical Decision Making Stable/Uncomplicated    Rehab Potential Good    PT Frequency 2x / week    PT Duration 8 weeks    PT Treatment/Interventions ADLs/Self Care Home Management;Therapeutic activities;Therapeutic exercise;Passive range of motion;Dry needling;Manual techniques;Electrical Stimulation;Cryotherapy;Moist Heat;Neuromuscular re-education;Patient/family education;Joint Manipulations    PT Next Visit Plan focus on thoracic mobility - spine and rib mobilizations,  scapular mobility - cervical spine with 2 fusions, focus on thoracic spinal mobility (lumbar spine also with 2 fusions)    PT Home Exercise Plan scapular protraction, self mobilization; 11/9 prone shoulder extension , cervical retraction, pec stretch; 11/19: decompression ex 1-3;  supine wand  exercises and supine cervical rotation 11/22 scap retraction depression           Patient will benefit from skilled therapeutic intervention in order to improve the following deficits and impairments:  Pain, Decreased range of motion, Decreased mobility, Decreased activity tolerance, Hypomobility  Visit Diagnosis: Cervicalgia  Pain in thoracic spine     Problem List Patient Active Problem List   Diagnosis Date Noted  . Lumbar stenosis with neurogenic claudication 10/10/2017  . Spondylolisthesis of L4-5 04/13/2012    Rayetta Humphrey, PT CLT [phone] 02/03/2020, 1:59 PM  [facility] [address] [place], [place], [address] Phone: [phone]   Fax:  [phone]  Name: KOURTLAND COOPMAN MRN: [id number] Date of Birth: 11-30-1970

## 2020-02-04 ENCOUNTER — Ambulatory Visit (HOSPITAL_COMMUNITY): Payer: Managed Care, Other (non HMO) | Admitting: Physical Therapy

## 2020-02-09 ENCOUNTER — Ambulatory Visit (HOSPITAL_COMMUNITY): Payer: Managed Care, Other (non HMO) | Admitting: Physical Therapy

## 2020-02-09 ENCOUNTER — Encounter (HOSPITAL_COMMUNITY): Payer: Self-pay | Admitting: Physical Therapy

## 2020-02-09 ENCOUNTER — Other Ambulatory Visit: Payer: Self-pay

## 2020-02-09 DIAGNOSIS — M542 Cervicalgia: Secondary | ICD-10-CM

## 2020-02-09 DIAGNOSIS — M546 Pain in thoracic spine: Secondary | ICD-10-CM

## 2020-02-09 NOTE — Patient Instructions (Signed)
.   lay on your back- hands on your lower ribs, goal is to have ribs come together and down towards pelvis. - focus on long exhale where you contract your upper abdominals and maintain this contraction while you breath in and then breath out again trying to bring ribs closer together - repeat 5 times - then rest and then perform 5 additional sets

## 2020-02-09 NOTE — Therapy (Signed)
[facility] [address] [place], [place], [address] Phone: [phone]   Fax:  [phone]  Physical Therapy Treatment Progress Note  Patient Details  Name: Troy Franklin MRN: [id number] Date of Birth: 1970-11-28 Referring Provider (PT):  Ashok Pall MD   Progress Note Reporting Period 01/02/20 to 02/09/20  See note below for Objective Data and Assessment of Progress/Goals.       Encounter Date: 02/09/2020   PT End of Session - 02/09/20 1134    Visit Number 8    Number of Visits 12    Date for PT Re-Evaluation 02/27/20    Authorization Type cigna managed - no auth, VL 30 with 0 used    Authorization - Visit Number 8    Authorization - Number of Visits 30    Progress Note Due on Visit 18    PT Start Time 3785    PT Stop Time 1213    PT Time Calculation (min) 38 min    Activity Tolerance Patient tolerated treatment well    Behavior During Therapy WFL for tasks assessed/performed           Past Medical History:  Diagnosis Date  . Arthritis    lumbar  . Complication of anesthesia    difficulty waking up- sleepiness lasted longer than other times.   [name] GERD (gastroesophageal reflux disease)   . 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 Hypertension   . Loud breathing during sleep    snoring, states the sleep study was WNL, done betwn 1995-2005  . PONV (postoperative nausea and vomiting)   . Sleep apnea    Cpap  . Spondylolisthesis of lumbar region     Past Surgical History:  Procedure Laterality Date  . ANKLE FRACTURE SURGERY     right - pins and screws  . BACK SURGERY    . CERVICAL FUSION  2013  . CERVICAL FUSION     2007 & 2013  . HERNIA REPAIR  1974   hydrocele  . KNEE LIGAMENT RECONSTRUCTION     right   . LUMBAR LAMINECTOMY/DECOMPRESSION MICRODISCECTOMY N/A 10/10/2017   Procedure: Lumbar Three-Four Laminectomy;  Surgeon: Ashok Pall, MD;  Location: [place];  Service: Neurosurgery;  Laterality: N/A;    . VASECTOMY      There were no vitals filed for this visit.   Subjective Assessment - 02/09/20 1134    Subjective Patient reports overall since start of PT his symptoms are about the same or maybe a little worse since the start of PT. States the last session he had to do some exercises and that aggravated his symptoms. States he follows up with his MD 02/16/20. States that he tried to tie his shoes this morning and his muscle  caught. This resolved in about 10 minutes    Pertinent History hx of 2 cervical fusions and total has had 7 back surgeries- most recent lumbar fusion 09/19/19    Currently in Pain? Yes    Pain Score 5     Pain Location Neck    Pain Orientation Posterior    Pain Descriptors / Indicators Aching    Pain Radiating Towards across head and shoulders    Pain Onset More than a month ago              [facility] PT Assessment - 02/09/20 0001      Assessment   Medical Diagnosis cervical spondylosis and radiculopathy    Referring Provider (PT)  Ashok Pall MD    Next MD Visit 02/16/20      Observation/Other Assessments   Focus on Therapeutic Outcomes (FOTO)  36% function    was 43% function      AROM   Cervical Flexion 25   pain along back of neck    Cervical Extension 22   pain in back of head   Cervical - Right Rotation 28   tightness   Cervical - Left Rotation 30   tightness                        [facility] Adult PT Treatment/Exercise - 02/09/20 0001      Neck Exercises: Supine   Other Supine Exercise deep breathing techniques - focus on exhale. -15 minutes practice- verbal and tactile cues      Manual Therapy   Manual Therapy Joint mobilization;Soft tissue mobilization    Manual therapy comments all manual interventions performed independently of other interventions.     Joint Mobilization R UAP ribs 3-5 grade II/III - referred pain to posterio region     Soft tissue mobilization STM to right pec major and intercostals of 3-5 Right                   PT Education - 02/09/20 1153    Education Details reviewed FOTO score, HEP, and current presentaiton, on rib mobility, anatomy, and breathing techniques    Person(s) Educated Patient    Methods Explanation    Comprehension Verbalized understanding            PT Short Term Goals - 02/09/20 1144      PT SHORT TERM GOAL #1   Title Patient will report at least 25% improvement in overall symptoms and/or function to demonstrate improved functional mobility    Baseline 0% better    Time 4    Period Weeks    Status On-going    Target Date 01/30/20      PT SHORT TERM GOAL #2   Title Patient will be independent in self management strategies to improve quality of life and functional outcomes.    Time 4    Period Weeks    Status Achieved    Target Date 01/30/20             PT Long Term Goals - 02/09/20 1145      PT LONG TERM GOAL #1   Title Patient will report at least 50% improvement in overall symptoms and/or function to demonstrate improved functional mobility    Baseline 0% better    Time 8    Period Weeks    Status On-going      PT LONG TERM GOAL #2   Title Patient will report ability to stay asleep without being woken up by neck pain to improve sleep quality    Baseline still waking up on average 3x a night due to pain    Time 8    Period Weeks    Status On-going      PT LONG TERM GOAL #3   Title Patient will report pain to no longer be constant pain (in base of neck) to improve QOL    Baseline continued pain    Time 8    Period Weeks    Status On-going                 Plan - 02/09/20 1134    Clinical Impression Statement Progress note on  this date. Limited progress noted in ROM, FOTO score and overall goals. One short term goal met but no other goals met at this time. Patient to follow up with MD 02/16/20, and has one additional PT visit. Anticipate discharge from PT after next session pending patient presentation. Today's session focused  on upper abdominal contractions with breath work and lower rib closure. Tolerated well but no change in symptoms noted end of session. Will continue with current POC.    Personal Factors and Comorbidities Comorbidity 1;Comorbidity 2    Comorbidities history of 7 spinal surgeries - 2 neck fusions and 5 lumbar surgeries -    Examination-Activity Limitations Bed Mobility;Reach Overhead;Lift;Sleep    Examination-Participation Restrictions Cleaning    Stability/Clinical Decision Making Stable/Uncomplicated    Rehab Potential Good    PT Frequency 2x / week    PT Duration 8 weeks    PT Treatment/Interventions ADLs/Self Care Home Management;Therapeutic activities;Therapeutic exercise;Passive range of motion;Dry needling;Manual techniques;Electrical Stimulation;Cryotherapy;Moist Heat;Neuromuscular re-education;Patient/family education;Joint Manipulations    PT Next Visit Plan anticipate DC next session. F/u with breathing techniques and upper abdominal contraction with protraction    PT Home Exercise Plan deep breathing techniques with upper TRA activation, self mobilization with tennis ball    Consulted and Agree with Plan of Care Patient           Patient will benefit from skilled therapeutic intervention in order to improve the following deficits and impairments:  Pain, Decreased range of motion, Decreased mobility, Decreased activity tolerance, Hypomobility  Visit Diagnosis: Cervicalgia  Pain in thoracic spine     Problem List Patient Active Problem List   Diagnosis Date Noted  . Lumbar stenosis with neurogenic claudication 10/10/2017  . Spondylolisthesis of L4-5 04/13/2012    12:26 PM, 02/09/20 Jerene Pitch, DPT Physical Therapy with [facility]  [phone] office  [place] [address] [place], [place], [address] Phone: [phone]   Fax:  [phone]  Name: Troy Franklin MRN: [id number] Date of Birth:  [birth year]

## 2020-02-11 ENCOUNTER — Ambulatory Visit (HOSPITAL_COMMUNITY): Payer: Managed Care, Other (non HMO) | Attending: Neurosurgery | Admitting: Physical Therapy

## 2020-02-11 ENCOUNTER — Other Ambulatory Visit: Payer: Self-pay

## 2020-02-11 ENCOUNTER — Encounter (HOSPITAL_COMMUNITY): Payer: Self-pay | Admitting: Physical Therapy

## 2020-02-11 DIAGNOSIS — M542 Cervicalgia: Secondary | ICD-10-CM | POA: Diagnosis present

## 2020-02-11 DIAGNOSIS — M546 Pain in thoracic spine: Secondary | ICD-10-CM | POA: Insufficient documentation

## 2020-02-11 NOTE — Therapy (Signed)
[facility] [address] [place], [place], [address] Phone: [phone]   Fax:  [phone]  Physical Therapy Treatment and Discharge note  Patient Details  Name: Troy Franklin MRN: [id number] Date of Birth: 07-30-1970 Referring Provider (PT):  Ashok Pall MD   PHYSICAL THERAPY DISCHARGE SUMMARY  Visits from Start of Care: 9  Current functional level related to goals / functional outcomes: See below   Remaining deficits: See below   Education / Equipment: See below  Plan: Patient agrees to discharge.  Patient goals were partially met. Patient is being discharged due to lack of progress.  ?????         Encounter Date: 02/11/2020   PT End of Session - 02/11/20 1329    Visit Number 9    Number of Visits 12    Date for PT Re-Evaluation 02/27/20    Authorization Type cigna managed - no auth, VL 30 with 0 used    Authorization - Visit Number 9    Authorization - Number of Visits 30    Progress Note Due on Visit 18    PT Start Time 1134    PT Stop Time 1213    PT Time Calculation (min) 39 min    Activity Tolerance Patient tolerated treatment well    Behavior During Therapy WFL for tasks assessed/performed           Past Medical History:  Diagnosis Date  . Arthritis    lumbar  . Complication of anesthesia    difficulty waking up- sleepiness lasted longer than other times.   [name] GERD (gastroesophageal reflux disease)   . 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 Hypertension   . Loud breathing during sleep    snoring, states the sleep study was WNL, done betwn 1995-2005  . PONV (postoperative nausea and vomiting)   . Sleep apnea    Cpap  . Spondylolisthesis of lumbar region     Past Surgical History:  Procedure Laterality Date  . ANKLE FRACTURE SURGERY     right - pins and screws  . BACK SURGERY    . CERVICAL FUSION  2013  . CERVICAL FUSION     2007 & 2013  . HERNIA REPAIR  1974   hydrocele  . KNEE  LIGAMENT RECONSTRUCTION     right   . LUMBAR LAMINECTOMY/DECOMPRESSION MICRODISCECTOMY N/A 10/10/2017   Procedure: Lumbar Three-Four Laminectomy;  Surgeon: Ashok Pall, MD;  Location: [facility];  Service: Neurosurgery;  Laterality: N/A;  . VASECTOMY      There were no vitals filed for this visit.       [facility] PT Assessment - 02/11/20 0001      Assessment   Medical Diagnosis cervical spondylosis and radiculopathy    Referring Provider (PT)  Ashok Pall MD    Next MD Visit 02/16/20      Observation/Other Assessments   Focus on Therapeutic Outcomes (FOTO)  36% function    was 43% function      AROM   Cervical Flexion 25   pain along back of neck    Cervical Extension 22   pain in back of head   Cervical - Right Rotation 28   tightness   Cervical - Left Rotation 30   tightness                        [facility] Adult PT Treatment/Exercise - 02/11/20 0001  Neck Exercises: Supine   Other Supine Exercise deep breathing techniques - focus on exhale. -15 minutes practice- verbal and tactile cues      Manual Therapy   Manual Therapy Soft tissue mobilization    Manual therapy comments all manual interventions performed independently of other interventions.     Soft tissue mobilization IASTM (Instrument assisted soft tissue mobilization) with percussion gun to bilateral cervical paraspinals  and thoracic paraspinal - tolerated well.                    PT Education - 02/11/20 1328    Education Details on use of percussion gun, on HEP and breathing exercise    Person(s) Educated Patient    Methods Explanation    Comprehension Verbalized understanding            PT Short Term Goals - 02/09/20 1144      PT SHORT TERM GOAL #1   Title Patient will report at least 25% improvement in overall symptoms and/or function to demonstrate improved functional mobility    Baseline 0% better    Time 4    Period Weeks    Status On-going    Target Date 01/30/20      PT  SHORT TERM GOAL #2   Title Patient will be independent in self management strategies to improve quality of life and functional outcomes.    Time 4    Period Weeks    Status Achieved    Target Date 01/30/20             PT Long Term Goals - 02/09/20 1145      PT LONG TERM GOAL #1   Title Patient will report at least 50% improvement in overall symptoms and/or function to demonstrate improved functional mobility    Baseline 0% better    Time 8    Period Weeks    Status On-going      PT LONG TERM GOAL #2   Title Patient will report ability to stay asleep without being woken up by neck pain to improve sleep quality    Baseline still waking up on average 3x a night due to pain    Time 8    Period Weeks    Status On-going      PT LONG TERM GOAL #3   Title Patient will report pain to no longer be constant pain (in base of neck) to improve QOL    Baseline continued pain    Time 8    Period Weeks    Status On-going                 Plan - 02/11/20 1329    Clinical Impression Statement Patient educated in continued HEP and use of percussion gun for symptom management. Overall lack of progress noted with therapy, but patient is following up with MD later this month. Reviewed all exercises and answered all questions prior to end of session. Patient to discharge to home program at this time secondary to lack of progress.    Personal Factors and Comorbidities Comorbidity 1;Comorbidity 2    Comorbidities history of 7 spinal surgeries - 2 neck fusions and 5 lumbar surgeries -    Examination-Activity Limitations Bed Mobility;Reach Overhead;Lift;Sleep    Examination-Participation Restrictions Cleaning    Stability/Clinical Decision Making Stable/Uncomplicated    Rehab Potential Good    PT Frequency 2x / week    PT Duration 8 weeks    PT Treatment/Interventions ADLs/Self Care  Home Management;Therapeutic activities;Therapeutic exercise;Passive range of motion;Dry needling;Manual  techniques;Electrical Stimulation;Cryotherapy;Moist Heat;Neuromuscular re-education;Patient/family education;Joint Manipulations    PT Next Visit Plan patient to discharge from PT to HEP    PT Home Exercise Plan deep breathing techniques with upper TRA activation, self mobilization with tennis ball    Recommended Other Services f/u with MD    Consulted and Agree with Plan of Care Patient           Patient will benefit from skilled therapeutic intervention in order to improve the following deficits and impairments:  Pain, Decreased range of motion, Decreased mobility, Decreased activity tolerance, Hypomobility  Visit Diagnosis: Cervicalgia  Pain in thoracic spine     Problem List Patient Active Problem List   Diagnosis Date Noted  . Lumbar stenosis with neurogenic claudication 10/10/2017  . Spondylolisthesis of L4-5 04/13/2012    1:40 PM, 02/11/20 Jerene Pitch, DPT Physical Therapy with [facility]  [phone] office  [place] [address] [place], [place], [address] Phone: [phone]   Fax:  [phone]  Name: Troy Franklin MRN: [id number] Date of Birth: 05-Nov-1970

## 2020-02-24 ENCOUNTER — Other Ambulatory Visit: Payer: Self-pay | Admitting: Neurosurgery

## 2020-02-24 DIAGNOSIS — M4722 Other spondylosis with radiculopathy, cervical region: Secondary | ICD-10-CM

## 2020-03-21 ENCOUNTER — Ambulatory Visit
Admission: RE | Admit: 2020-03-21 | Discharge: 2020-03-21 | Disposition: A | Discharge status: Home / Self Care | Payer: Managed Care, Other (non HMO) | Source: Ambulatory Visit | Attending: Neurosurgery | Admitting: Neurosurgery

## 2020-03-21 ENCOUNTER — Other Ambulatory Visit: Payer: Self-pay

## 2020-03-21 DIAGNOSIS — M4722 Other spondylosis with radiculopathy, cervical region: Secondary | ICD-10-CM

## 2020-08-13 ENCOUNTER — Encounter: Payer: Self-pay | Admitting: Nurse Practitioner

## 2020-08-13 ENCOUNTER — Other Ambulatory Visit: Payer: Self-pay

## 2020-08-13 ENCOUNTER — Ambulatory Visit: Payer: Managed Care, Other (non HMO) | Admitting: Nurse Practitioner

## 2020-08-13 VITALS — BP 138/75 | HR 67 | Resp 15 | Ht 67.0 in | Wt 236.0 lb

## 2020-08-13 DIAGNOSIS — G4733 Obstructive sleep apnea (adult) (pediatric): Secondary | ICD-10-CM

## 2020-08-13 DIAGNOSIS — Z139 Encounter for screening, unspecified: Secondary | ICD-10-CM | POA: Diagnosis not present

## 2020-08-13 DIAGNOSIS — Z0001 Encounter for general adult medical examination with abnormal findings: Secondary | ICD-10-CM | POA: Diagnosis not present

## 2020-08-13 DIAGNOSIS — M48062 Spinal stenosis, lumbar region with neurogenic claudication: Secondary | ICD-10-CM

## 2020-08-13 DIAGNOSIS — Z7689 Persons encountering health services in other specified circumstances: Secondary | ICD-10-CM | POA: Diagnosis not present

## 2020-08-13 DIAGNOSIS — G4730 Sleep apnea, unspecified: Secondary | ICD-10-CM | POA: Insufficient documentation

## 2020-08-13 DIAGNOSIS — Z1211 Encounter for screening for malignant neoplasm of colon: Secondary | ICD-10-CM

## 2020-08-13 DIAGNOSIS — Z0000 Encounter for general adult medical examination without abnormal findings: Secondary | ICD-10-CM

## 2020-08-13 DIAGNOSIS — I10 Essential (primary) hypertension: Secondary | ICD-10-CM

## 2020-08-13 MED ORDER — LOSARTAN POTASSIUM-HCTZ 50-12.5 MG PO TABS: 1.0000 | ORAL_TABLET | Freq: Every day | ORAL | 1 refills | Status: DC

## 2020-08-13 NOTE — Progress Notes (Signed)
New Patient Office Visit  Subjective:  Patient ID: Troy Franklin, male    DOB: February 04, 1971  Age: 50 y.o. MRN: [id number]  CC:  Chief Complaint  Patient presents with  . New Patient (Initial Visit)    Needs new primary care    HPI Troy Franklin presents for new patient visit. Transferring care from [facility]. Last physical was 06/16/19 Last labs were drawn 09/17/19.  No acute concerns.  Wears CPAP, and that is managed by [employer].  He is having neck pain, but he is followed by Dr. Christella Noa who has performed multiple surgeries on his back/neck.    Past Medical History:  Diagnosis Date  . Arthritis    lumbar  . Complication of anesthesia    difficulty waking up- sleepiness lasted longer than other times.   [name] GERD (gastroesophageal reflux disease)   . 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 Hypertension   . Loud breathing during sleep    snoring, states the sleep study was WNL, done betwn 1995-2005  . PONV (postoperative nausea and vomiting)   . Sleep apnea    Cpap  . Spondylolisthesis of lumbar region     Past Surgical History:  Procedure Laterality Date  . ANKLE FRACTURE SURGERY     right - pins and screws  . BACK SURGERY     fusion 2014, laminectomy 2015, laminectomy and fusion 2019, fusion 2021  . CERVICAL FUSION  2013  . CERVICAL FUSION     2007 & 2013 & March 2022  . HERNIA REPAIR  1974   hydrocele  . KNEE LIGAMENT RECONSTRUCTION     right 1987  . LUMBAR LAMINECTOMY/DECOMPRESSION MICRODISCECTOMY N/A 10/10/2017   Procedure: Lumbar Three-Four Laminectomy;  Surgeon: Ashok Pall, MD;  Location: [facility];  Service: Neurosurgery;  Laterality: N/A;  . VASECTOMY      History reviewed. No pertinent family history.  Social History   Socioeconomic History  . Marital status: Married    Spouse name: Not on file  . Number of children: 1  . Years of education: Not on file  . Highest education level: Not on file  Occupational History  . Occupation: [employer]    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  Tobacco Use  . Smoking status: Former Smoker    Packs/day: 0.25    Years: 4.00    Pack years: 1.00    Types: Cigarettes    Quit date: 12/16/2018    Years since quitting: 1.6  . Smokeless tobacco: Never Used  Vaping Use  . Vaping Use: Never used  Substance and Sexual Activity  . Alcohol use: Yes    Comment: socially- x1/month  . Drug use: No  . Sexual activity: Yes  Other Topics Concern  . Not on file  Social History Narrative   1 child, 1 stepchild   Social Determinants of Health   Financial Resource Strain: Not on file  Food Insecurity: Not on file  Transportation Needs: Not on file  Physical Activity: Not on file  Stress: Not on file  Social Connections: Not on file  Intimate Partner Violence: Not on file    ROS Review of Systems  Constitutional: Negative.   Respiratory: Negative.   Cardiovascular: Negative.   Musculoskeletal: Positive for neck pain.  Psychiatric/Behavioral: Negative.     Objective:   Today's Vitals: BP 138/75   Pulse 67   Resp 15   Ht $R'5\' 7"'Hk$  (1.702 m)   Wt 236 lb (107  kg)   SpO2 95%   BMI 36.96 kg/m   Physical Exam Constitutional:      Appearance: Normal appearance. He is obese.  Cardiovascular:     Rate and Rhythm: Normal rate and regular rhythm.     Pulses: Normal pulses.     Heart sounds: Normal heart sounds.  Pulmonary:     Effort: Pulmonary effort is normal.     Breath sounds: Normal breath sounds.  Neurological:     Mental Status: He is alert.  Psychiatric:        Mood and Affect: Mood normal.        Behavior: Behavior normal.        Thought Content: Thought content normal.        Judgment: Judgment normal.     Assessment & Plan:   Problem List Items Addressed This Visit      Cardiovascular and Mediastinum   HTN (hypertension)    -takes HCTZ 12.5 mg daily -takes losartan 50 mg daily -sent in refill for combinations therapy; if this isn't available, may need  to separate medications again -BP well controlled       Relevant Medications   losartan-hydrochlorothiazide (HYZAAR) 50-12.5 MG tablet     Respiratory   Sleep apnea    -wears CPAP at night -managed by        Other   Lumbar stenosis with neurogenic claudication   Encounter to establish care - Primary    -records available in Epic      Relevant Orders   CBC with Differential/Platelet   CMP14+EGFR   Lipid Panel With LDL/HDL Ratio   PSA   TSH   Screening due    -will check HCV and HIV with next set of labs -set him up for screening colonoscopy with Dr. Olevia Perches office      Relevant Orders   Hepatitis C antibody   HIV Antibody (routine testing w rflx)    Other Visit Diagnoses    Colon cancer screening       Relevant Orders   Ambulatory referral to Gastroenterology   General medical exam       Relevant Orders   CBC with Differential/Platelet   CMP14+EGFR   Lipid Panel With LDL/HDL Ratio   PSA   TSH      Outpatient Encounter Medications as of 08/13/2020  Medication Sig  . losartan-hydrochlorothiazide (HYZAAR) 50-12.5 MG tablet Take 1 tablet by mouth daily.  [name] omeprazole (PRILOSEC) 20 MG capsule Take 20 mg by mouth daily.  [name] tiZANidine (ZANAFLEX) 4 MG tablet Take 1 tablet (4 mg total) by mouth every 6 (six) hours as needed for muscle spasms.  . [DISCONTINUED] hydrochlorothiazide (MICROZIDE) 12.5 MG capsule Take 12.5 mg by mouth daily.  . [DISCONTINUED] losartan (COZAAR) 50 MG tablet Take 50 mg by mouth daily.   No facility-administered encounter medications on file as of 08/13/2020.    Follow-up: Return in about 2 weeks (around 08/27/2020) for Physical Exam.   Noreene Larsson, NP

## 2020-08-13 NOTE — Assessment & Plan Note (Signed)
-  takes HCTZ 12.5 mg daily -takes losartan 50 mg daily -sent in refill for combinations therapy; if this isn't available, may need to separate medications again -BP well controlled

## 2020-08-13 NOTE — Assessment & Plan Note (Signed)
-  records available in [facility]

## 2020-08-13 NOTE — Assessment & Plan Note (Signed)
-  wears CPAP at night -managed by

## 2020-08-13 NOTE — Assessment & Plan Note (Addendum)
-  will check HCV and HIV with next set of labs -set him up for screening colonoscopy with Dr. Olevia Perches office

## 2020-08-13 NOTE — Patient Instructions (Signed)
Please have fasting labs drawn 2-3 days prior to your appointment so we can discuss the results during your office visit.  

## 2020-08-16 ENCOUNTER — Encounter (INDEPENDENT_AMBULATORY_CARE_PROVIDER_SITE_OTHER): Payer: Self-pay | Admitting: *Deleted

## 2020-08-28 LAB — CBC WITH DIFFERENTIAL/PLATELET
Basophils Absolute: 0.1 10*3/uL (ref 0.0–0.2)
Basos: 1 %
EOS (ABSOLUTE): 0.2 10*3/uL (ref 0.0–0.4)
Eos: 3 %
Hematocrit: 43.9 % (ref 37.5–51.0)
Hemoglobin: 15.2 g/dL (ref 13.0–17.7)
Immature Grans (Abs): 0 10*3/uL (ref 0.0–0.1)
Immature Granulocytes: 0 %
Lymphocytes Absolute: 1.9 10*3/uL (ref 0.7–3.1)
Lymphs: 35 %
MCH: 30.8 pg (ref 26.6–33.0)
MCHC: 34.6 g/dL (ref 31.5–35.7)
MCV: 89 fL (ref 79–97)
Monocytes Absolute: 0.6 10*3/uL (ref 0.1–0.9)
Monocytes: 11 %
Neutrophils Absolute: 2.8 10*3/uL (ref 1.4–7.0)
Neutrophils: 50 %
Platelets: 162 10*3/uL (ref 150–450)
RBC: 4.93 x10E6/uL (ref 4.14–5.80)
RDW: 12.7 % (ref 11.6–15.4)
WBC: 5.5 10*3/uL (ref 3.4–10.8)

## 2020-08-28 LAB — CMP14+EGFR
ALT: 31 IU/L (ref 0–44)
AST: 20 IU/L (ref 0–40)
Albumin/Globulin Ratio: 1.7 (ref 1.2–2.2)
Albumin: 4.4 g/dL (ref 3.8–4.9)
Alkaline Phosphatase: 89 IU/L (ref 44–121)
BUN/Creatinine Ratio: 11 (ref 9–20)
BUN: 12 mg/dL (ref 6–24)
Bilirubin Total: 1 mg/dL (ref 0.0–1.2)
CO2: 22 mmol/L (ref 20–29)
Calcium: 9.7 mg/dL (ref 8.7–10.2)
Chloride: 102 mmol/L (ref 96–106)
Creatinine, Ser: 1.11 mg/dL (ref 0.76–1.27)
Globulin, Total: 2.6 g/dL (ref 1.5–4.5)
Glucose: 96 mg/dL (ref 65–99)
Potassium: 4.5 mmol/L (ref 3.5–5.2)
Sodium: 139 mmol/L (ref 134–144)
Total Protein: 7 g/dL (ref 6.0–8.5)
eGFR: 80 mL/min/{1.73_m2} (ref >59)

## 2020-08-28 LAB — LIPID PANEL WITH LDL/HDL RATIO
Cholesterol, Total: 179 mg/dL (ref 100–199)
HDL: 43 mg/dL (ref >39)
LDL Chol Calc (NIH): 117 mg/dL — ABNORMAL HIGH (ref 0–99)
LDL/HDL Ratio: 2.7 ratio (ref 0.0–3.6)
Triglycerides: 107 mg/dL (ref 0–149)
VLDL Cholesterol Cal: 19 mg/dL (ref 5–40)

## 2020-08-28 LAB — HEPATITIS C ANTIBODY: Hep C Virus Ab: <0.1 s/co ratio (ref 0.0–0.9)

## 2020-08-28 LAB — PSA: Prostate Specific Ag, Serum: 2 ng/mL (ref 0.0–4.0)

## 2020-08-28 LAB — TSH: TSH: 2.2 u[IU]/mL (ref 0.450–4.500)

## 2020-08-28 LAB — HIV ANTIBODY (ROUTINE TESTING W REFLEX): HIV Screen 4th Generation wRfx: NONREACTIVE

## 2020-08-30 NOTE — Progress Notes (Signed)
His LDL, or bad cholesterol, is slightly elevated. Cut back on fried/fatty foods. If he is interested in medication for this, we can discuss that at his appt on 6/22.

## 2020-08-31 NOTE — Progress Notes (Signed)
Pt informed. Will work on diet first.

## 2020-09-01 ENCOUNTER — Encounter: Payer: Self-pay | Admitting: Nurse Practitioner

## 2020-09-01 ENCOUNTER — Other Ambulatory Visit: Payer: Self-pay

## 2020-09-01 ENCOUNTER — Ambulatory Visit (INDEPENDENT_AMBULATORY_CARE_PROVIDER_SITE_OTHER): Payer: Managed Care, Other (non HMO) | Admitting: Nurse Practitioner

## 2020-09-01 DIAGNOSIS — M65321 Trigger finger, right index finger: Secondary | ICD-10-CM | POA: Insufficient documentation

## 2020-09-01 DIAGNOSIS — I10 Essential (primary) hypertension: Secondary | ICD-10-CM | POA: Diagnosis not present

## 2020-09-01 DIAGNOSIS — E7800 Pure hypercholesterolemia, unspecified: Secondary | ICD-10-CM

## 2020-09-01 DIAGNOSIS — E785 Hyperlipidemia, unspecified: Secondary | ICD-10-CM | POA: Insufficient documentation

## 2020-09-01 DIAGNOSIS — G4733 Obstructive sleep apnea (adult) (pediatric): Secondary | ICD-10-CM

## 2020-09-01 MED ORDER — UNABLE TO FIND
0 refills | Status: DC
Start: 2020-09-01 — End: 2022-10-17

## 2020-09-01 NOTE — Assessment & Plan Note (Signed)
BP Readings from Last 3 Encounters:  09/01/20 125/78  08/13/20 138/75  09/22/19 138/76   -well controlled today -no med changes

## 2020-09-01 NOTE — Progress Notes (Signed)
Established Patient Office Visit  Subjective:  Patient ID: Troy Franklin, male    DOB: [birth year]  Age: 50 y.o. MRN: [id number]  CC:  Chief Complaint  Patient presents with   Annual Exam    CPE    HPI [name] presents for physical exam.  He has some right index finger stiffness in the AM .  He has CPAP machine at home that is 52 years old. He states he isn't sleeping as well as he used to, and he would like to get a new CPAP machine.  Past Medical History:  Diagnosis Date   Arthritis    lumbar   Complication of anesthesia    difficulty waking up- sleepiness lasted longer than other times.    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   Hypertension    Loud breathing during sleep    snoring, states the sleep study was WNL, done betwn 1995-2005   PONV (postoperative nausea and vomiting)    Sleep apnea    Cpap   Spondylolisthesis of lumbar region     Past Surgical History:  Procedure Laterality Date   ANKLE FRACTURE SURGERY     right - pins and screws   BACK SURGERY     fusion 2014, laminectomy 2015, laminectomy and fusion 2019, fusion 2021   CERVICAL FUSION  2013   CERVICAL FUSION     2007 & 2013 & March 2022   HERNIA REPAIR  1974   hydrocele   KNEE LIGAMENT RECONSTRUCTION     right 1987   LUMBAR LAMINECTOMY/DECOMPRESSION MICRODISCECTOMY N/A 10/10/2017   Procedure: Lumbar Three-Four Laminectomy;  Surgeon: Coletta Memos, MD;  Location: [facility] OR;  Service: Neurosurgery;  Laterality: N/A;   VASECTOMY      History reviewed. No pertinent family history.  Social History   Socioeconomic History   Marital status: Married    Spouse name: Not on file   Number of children: 1   Years of education: Not on file   Highest education level: Not on file  Occupational History   Occupation: [employer]    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  Tobacco Use   Smoking status: Former    Packs/day: 0.25    Years: 4.00     Pack years: 1.00    Types: Cigarettes    Quit date: 12/16/2018    Years since quitting: 1.7   Smokeless tobacco: Never  Vaping Use   Vaping Use: Never used  Substance and Sexual Activity   Alcohol use: Yes    Comment: socially- x1/month   Drug use: No   Sexual activity: Yes  Other Topics Concern   Not on file  Social History Narrative   1 child, 1 stepchild   Social Determinants of Health   Financial Resource Strain: Not on file  Food Insecurity: Not on file  Transportation Needs: Not on file  Physical Activity: Not on file  Stress: Not on file  Social Connections: Not on file  Intimate Partner Violence: Not on file    Outpatient Medications Prior to Visit  Medication Sig Dispense Refill   losartan-hydrochlorothiazide (HYZAAR) 50-12.5 MG tablet Take 1 tablet by mouth daily. 90 tablet 1   omeprazole (PRILOSEC) 20 MG capsule Take 20 mg by mouth daily.     tiZANidine (ZANAFLEX) 4 MG tablet Take 1 tablet (4 mg total) by mouth every 6 (six) hours as needed for muscle spasms. (Patient not taking: Reported on  09/01/2020) 60 tablet 0   No facility-administered medications prior to visit.    Allergies  Allergen Reactions   Onion Other (See Comments)    Migraines     ROS Review of Systems  Constitutional: Negative.   HENT: Negative.    Eyes: Negative.   Respiratory: Negative.    Cardiovascular: Negative.   Gastrointestinal: Negative.   Endocrine: Negative.   Genitourinary: Negative.   Musculoskeletal:        Right index finger is stiff in the AM  Skin: Negative.   Allergic/Immunologic: Negative.   Neurological: Negative.   Hematological: Negative.   Psychiatric/Behavioral: Negative.       Objective:    Physical Exam Constitutional:      Appearance: Normal appearance. He is obese.  HENT:     Head: Normocephalic and atraumatic.     Right Ear: Tympanic membrane, ear canal and external ear normal.     Left Ear: Tympanic membrane, ear canal and external ear  normal.     Nose: Nose normal.     Mouth/Throat:     Mouth: Mucous membranes are moist.     Pharynx: Oropharynx is clear.  Eyes:     Extraocular Movements: Extraocular movements intact.     Conjunctiva/sclera: Conjunctivae normal.     Pupils: Pupils are equal, round, and reactive to light.  Cardiovascular:     Rate and Rhythm: Normal rate and regular rhythm.     Pulses: Normal pulses.     Heart sounds: Normal heart sounds.  Pulmonary:     Effort: Pulmonary effort is normal.     Breath sounds: Normal breath sounds.  Abdominal:     General: Abdomen is flat. Bowel sounds are normal.     Palpations: Abdomen is soft.  Musculoskeletal:        General: Normal range of motion.     Cervical back: Normal range of motion and neck supple.  Skin:    General: Skin is warm and dry.     Capillary Refill: Capillary refill takes less than 2 seconds.  Neurological:     General: No focal deficit present.     Mental Status: He is alert and oriented to person, place, and time.     Cranial Nerves: No cranial nerve deficit.     Sensory: No sensory deficit.     Motor: No weakness.     Coordination: Coordination normal.     Gait: Gait normal.  Psychiatric:        Mood and Affect: Mood normal.        Behavior: Behavior normal.        Thought Content: Thought content normal.        Judgment: Judgment normal.    BP 125/78 (BP Location: Right Arm, Patient Position: Sitting, Cuff Size: Large)   Pulse 63   Temp (!) 97 F (36.1 C) (Temporal)   Ht $R'5\' 7"'Kl$  (1.702 m)   Wt 229 lb (103.9 kg)   SpO2 95%   BMI 35.87 kg/m  Wt Readings from Last 3 Encounters:  09/01/20 229 lb (103.9 kg)  08/13/20 236 lb (107 kg)  09/19/19 237 lb 4.8 oz (107.6 kg)     Health Maintenance Due  Topic Date Due   Pneumococcal Vaccine 71-2 Years old (1 - PCV) Never done   TETANUS/TDAP  Never done   Zoster Vaccines- Shingrix (1 of 2) Never done    There are no preventive care reminders to display for this patient.  Lab  Results  Component Value Date   TSH 2.200 08/27/2020   Lab Results  Component Value Date   WBC 5.5 08/27/2020   HGB 15.2 08/27/2020   HCT 43.9 08/27/2020   MCV 89 08/27/2020   PLT 162 08/27/2020   Lab Results  Component Value Date   NA 139 08/27/2020   K 4.5 08/27/2020   CO2 22 08/27/2020   GLUCOSE 96 08/27/2020   BUN 12 08/27/2020   CREATININE 1.11 08/27/2020   BILITOT 1.0 08/27/2020   ALKPHOS 89 08/27/2020   AST 20 08/27/2020   ALT 31 08/27/2020   PROT 7.0 08/27/2020   ALBUMIN 4.4 08/27/2020   CALCIUM 9.7 08/27/2020   ANIONGAP 9 09/17/2019   EGFR 80 08/27/2020   Lab Results  Component Value Date   CHOL 179 08/27/2020   Lab Results  Component Value Date   HDL 43 08/27/2020   Lab Results  Component Value Date   LDLCALC 117 (H) 08/27/2020   Lab Results  Component Value Date   TRIG 107 08/27/2020   No results found for: CHOLHDL No results found for: HGBA1C    Assessment & Plan:   Problem List Items Addressed This Visit       Cardiovascular and Mediastinum   HTN (hypertension)    BP Readings from Last 3 Encounters:  09/01/20 125/78  08/13/20 138/75  09/22/19 138/76  -well controlled today -no med changes       Relevant Orders   CBC with Differential/Platelet   CMP14+EGFR   Lipid Panel With LDL/HDL Ratio     Respiratory   Sleep apnea    -wears CPAP at night -managed by [facility] -he would like a new machine; may need a new sleep study -will send in an order for CPAP device, but will order new study depending on requirements       Relevant Medications   UNABLE TO FIND     Musculoskeletal and Integument   Trigger finger, right index finger    -discussed need for ortho consult -he is not interested at this time, but he will notify us if this gets worse         Other   Hyperlipidemia    Lab Results  Component Value Date   CHOL 179 08/27/2020   HDL 43 08/27/2020   LDLCALC 117 (H) 08/27/2020   TRIG 107 08/27/2020  -cut back on  fried/fatty foods -if no improvement in 6 months, will consider statin        Meds ordered this encounter  Medications   UNABLE TO FIND    Sig: Med Name: AutoPAP device. Use nightly for obstructive sleep apnea.    Dispense:  1 Device    Refill:  0    Pt has machine that is 52 years old. He would like new settings. If he needs a new sleep study, please let us know so we can order one.    Follow-up: Return in about 6 months (around 03/03/2021) for Lab follow-up (HTN, HLD).    Noreene Larsson, NP

## 2020-09-01 NOTE — Patient Instructions (Signed)
Please have fasting labs drawn 2-3 days prior to your appointment so we can discuss the results during your office visit.  

## 2020-09-01 NOTE — Assessment & Plan Note (Signed)
-  discussed need for ortho consult -he is not interested at this time, but he will notify us if this gets worse

## 2020-09-01 NOTE — Assessment & Plan Note (Signed)
Lab Results  Component Value Date   CHOL 179 08/27/2020   HDL 43 08/27/2020   LDLCALC 117 (H) 08/27/2020   TRIG 107 08/27/2020   -cut back on fried/fatty foods -if no improvement in 6 months, will consider statin

## 2020-09-01 NOTE — Assessment & Plan Note (Addendum)
-  wears CPAP at night -managed by [facility] -he would like a new machine; may need a new sleep study -will send in an order for CPAP device, but will order new study depending on requirements

## 2020-09-02 ENCOUNTER — Telehealth: Payer: Self-pay

## 2020-09-02 NOTE — Telephone Encounter (Signed)
Mateo Flow from [employer] called received an order for auto cpap.  Needs a new order with the settings on the order and face to face notes why use of the machine. Can be faxed to [phone]

## 2020-09-03 ENCOUNTER — Other Ambulatory Visit: Payer: Self-pay

## 2020-09-03 DIAGNOSIS — G4733 Obstructive sleep apnea (adult) (pediatric): Secondary | ICD-10-CM

## 2020-09-03 NOTE — Telephone Encounter (Signed)
Spoke with patient. Referral for new sleep study sent.

## 2020-10-27 ENCOUNTER — Institutional Professional Consult (permissible substitution): Payer: Managed Care, Other (non HMO) | Admitting: Pulmonary Disease

## 2020-11-30 ENCOUNTER — Telehealth (INDEPENDENT_AMBULATORY_CARE_PROVIDER_SITE_OTHER): Payer: Self-pay

## 2020-11-30 ENCOUNTER — Encounter (INDEPENDENT_AMBULATORY_CARE_PROVIDER_SITE_OTHER): Payer: Self-pay

## 2020-11-30 ENCOUNTER — Other Ambulatory Visit (INDEPENDENT_AMBULATORY_CARE_PROVIDER_SITE_OTHER): Payer: Self-pay

## 2020-11-30 DIAGNOSIS — Z1211 Encounter for screening for malignant neoplasm of colon: Secondary | ICD-10-CM

## 2020-11-30 MED ORDER — PEG 3350-KCL-NA BICARB-NACL 420 G PO SOLR: 4000.0000 mL | Freq: Once | ORAL | 0 refills | Status: AC

## 2020-11-30 NOTE — Telephone Encounter (Signed)
Referring MD/PCP: Demetrius Revel  Procedure: Screening Tcs  Reason/Indication:  screening  Has patient had this procedure before?  No  If so, when, by whom and where?    Is there a family history of colon cancer?  No  Who?  What age when diagnosed?    Is patient diabetic? If yes, Type 1 or Type 2   No      Does patient have prosthetic heart valve or mechanical valve?  No  Do you have a pacemaker/defibrillator?  No  Has patient ever had endocarditis/atrial fibrillation? No  Does patient use oxygen? No  Has patient had joint replacement within last 12 months?  No  Is patient constipated or do they take laxatives? No  Does patient have a history of alcohol/drug use?  No  Have you had a stroke/heart attack last 6 mths? No  Do you take medicine for weight loss?  No  Is patient on blood thinner such as Coumadin, Plavix and/or Aspirin? No  Medications: Losartan/Hctz 50/12.5 mg daily  Allergies: Nkda  Procedure date & time: 02/09/2021 at 7:30 am

## 2020-12-01 ENCOUNTER — Encounter (INDEPENDENT_AMBULATORY_CARE_PROVIDER_SITE_OTHER): Payer: Self-pay

## 2020-12-01 ENCOUNTER — Other Ambulatory Visit (INDEPENDENT_AMBULATORY_CARE_PROVIDER_SITE_OTHER): Payer: Self-pay

## 2020-12-01 DIAGNOSIS — I10 Essential (primary) hypertension: Secondary | ICD-10-CM

## 2020-12-01 DIAGNOSIS — Z1211 Encounter for screening for malignant neoplasm of colon: Secondary | ICD-10-CM

## 2020-12-01 MED ORDER — PEG 3350-KCL-NA BICARB-NACL 420 G PO SOLR: 4000.0000 mL | Freq: Once | ORAL | 0 refills | Status: AC

## 2020-12-01 NOTE — Telephone Encounter (Signed)
error 

## 2020-12-09 NOTE — Telephone Encounter (Signed)
Ok to schedule.  Thanks,  Daxton Castaneda Mayorga, MD Gastroenterology and Hepatology [facility]  

## 2021-01-03 ENCOUNTER — Other Ambulatory Visit: Payer: Self-pay

## 2021-01-03 ENCOUNTER — Ambulatory Visit: Payer: Managed Care, Other (non HMO) | Admitting: Pulmonary Disease

## 2021-01-03 ENCOUNTER — Encounter: Payer: Self-pay | Admitting: Pulmonary Disease

## 2021-01-03 VITALS — BP 130/78 | HR 84 | Temp 98.4°F | Ht 67.0 in | Wt 230.1 lb

## 2021-01-03 DIAGNOSIS — G4733 Obstructive sleep apnea (adult) (pediatric): Secondary | ICD-10-CM

## 2021-01-03 NOTE — Patient Instructions (Signed)
Will arrange for new medical supply company and arrange for new auto CPAP machine  Follow up in 4 to 5 months

## 2021-01-03 NOTE — Progress Notes (Signed)
[facility]  Chief Complaint  Patient presents with   Consult    Patient currently uses CPAP machine from [facility]. Patient got machine from [place].  Has been using since 2015. Feels that pressures need to be adjusted. Waking up frequently through the night.     Past Surgical History:  He  has a past surgical history that includes Knee ligament reconstruction; Vasectomy; Ankle fracture surgery; Cervical fusion (2013); Cervical fusion; Hernia repair [phone]); Back surgery; and Lumbar laminectomy/decompression microdiscectomy (N/A, 10/10/2017).  Past Medical History:  OA, GERD, Migraine headaches, HTN, Spondylolisthesis of lumbar spine  Constitutional:  BP 130/78 (BP Location: Left Arm, Patient Position: Sitting)   Pulse 84   Temp 98.4 F (36.9 C) (Temporal)   Ht 5\' 7"  (1.702 m)   Wt 230 lb 1.9 oz (104.4 kg)   SpO2 98%   BMI 36.04 kg/m   Brief Summary:  Troy Franklin is a 50 y.o. male with obstructive sleep apnea      Subjective:   He was previously followed by Dr. Derinda Late at [facility].  He had sleep study in November 2015 that showed severe obstructive sleep apnea.  He reports getting supplies from [employer].  His CPAP titration recommended setting of 11 cm H2O.  At the time of his study his BMI was 31 and now is 36.  When he first got his CPAP he felt like a new person.  More recently he is starting to feel tired during the day again.  He is waking up every couple of hours during the night.  He feels sluggish when he sits quiet.  He is using nasal pillow mask and no issues with mask fit.  He goes to sleep at 9 pm.  He falls asleep less than a minute.  He wakes up some times to use the bathroom.  He gets out of bed at 5 am.  He feels more tired in the morning.  He denies morning headache.  He does not use anything to help him fall sleep.  He drinks several cups of coffee during the day.  He denies sleep walking,  sleep talking, bruxism, or nightmares.  There is no history of restless legs.  He denies sleep hallucinations, sleep paralysis, or cataplexy.  The Epworth score is 15 out of 24.   Physical Exam:   Appearance - well kempt   ENMT - no sinus tenderness, no oral exudate, no LAN, Mallampati 3 airway, no stridor, scalloped tongue  Respiratory - equal breath sounds bilaterally, no wheezing or rales  CV - s1s2 regular rate and rhythm, no murmurs  Ext - no clubbing, no edema  Skin - no rashes  Psych - normal mood and affect   Sleep Tests:  PSG 01/14/14 >> AHI 36.5, SpO2 low 87.1% CPAP titration 02/03/14 >> CPAP 11 cm H2O >> AHI 0  Social History:  He  reports that he quit smoking about 2 years ago. His smoking use included cigarettes. He has a 1.00 pack-year smoking history. He has never used smokeless tobacco. He reports current alcohol use. He reports that he does not use drugs.  Family History:  His family history is negative for sleep apnea.    Discussion:  He has history of severe obstructive sleep apnea.  His current device is at least 52 years old and likely not functioning properly anymore.  He has also gained weight since original sleep study and CPAP set up.  Assessment/Plan:  Obstructive sleep apnea with excessive daytime sleepiness. - he will need to get set up with a new DME that accepts his insurance - he will need new CPAP machine; will arrange for auto CPAP 8 to 18 cm H2O - if he continues to have difficulty with sleep disruption and daytime sleepiness, then will need repeat in lab titration study - explained his insurance might require repeat home sleep study prior to getting new CPAP machine  Obesity. - discussed how weight can impact sleep and risk for sleep disordered breathing - discussed options to assist with weight loss: combination of diet modification, cardiovascular and strength training exercises  Cardiovascular risk. - had an extensive discussion  regarding the adverse health consequences related to untreated sleep disordered breathing - specifically discussed the risks for hypertension, coronary artery disease, cardiac dysrhythmias, cerebrovascular disease, and diabetes - lifestyle modification discussed  Safe driving practices. - discussed how sleep disruption can increase risk of accidents, particularly when driving - safe driving practices were discussed  Therapies for obstructive sleep apnea. - if the sleep study shows significant sleep apnea, then various therapies for treatment were reviewed: CPAP, oral appliance, and surgical interventions  Time Spent Involved in Patient Care on Day of Examination:  32 minutes  Follow up:   Patient Instructions  Will arrange for new medical supply company and arrange for new auto CPAP machine  Follow up in 4 to 5 months  Medication List:   Allergies as of 01/03/2021       Reactions   Onion Other (See Comments)   Migraines        Medication List        Accurate as of January 03, 2021  9:44 AM. If you have any questions, ask your nurse or doctor.          losartan-hydrochlorothiazide 50-12.5 MG tablet Commonly known as: HYZAAR Take 1 tablet by mouth daily.   omeprazole 20 MG capsule Commonly known as: PRILOSEC Take 20 mg by mouth daily.   UNABLE TO FIND Med Name: AutoPAP device. Use nightly for obstructive sleep apnea.        Signature:  Chesley Mires, MD [place] Pager - [phone] 01/03/2021, 9:44 AM

## 2021-01-04 ENCOUNTER — Other Ambulatory Visit: Payer: Self-pay | Admitting: Family Medicine

## 2021-02-07 ENCOUNTER — Other Ambulatory Visit (HOSPITAL_COMMUNITY)
Admission: RE | Admit: 2021-02-07 | Discharge: 2021-02-07 | Disposition: A | Discharge status: Home / Self Care | Payer: Managed Care, Other (non HMO) | Source: Ambulatory Visit | Attending: Gastroenterology | Admitting: Gastroenterology

## 2021-02-07 DIAGNOSIS — I10 Essential (primary) hypertension: Secondary | ICD-10-CM | POA: Diagnosis not present

## 2021-02-07 LAB — BASIC METABOLIC PANEL
Anion gap: 7 (ref 5–15)
BUN: 12 mg/dL (ref 6–20)
CO2: 26 mmol/L (ref 22–32)
Calcium: 9.8 mg/dL (ref 8.9–10.3)
Chloride: 104 mmol/L (ref 98–111)
Creatinine, Ser: 0.99 mg/dL (ref 0.61–1.24)
GFR, Estimated: >60 mL/min (ref >60)
Glucose, Bld: 107 mg/dL — ABNORMAL HIGH (ref 70–99)
Potassium: 4.5 mmol/L (ref 3.5–5.1)
Sodium: 137 mmol/L (ref 135–145)

## 2021-02-08 NOTE — Anesthesia Preprocedure Evaluation (Signed)
Anesthesia Evaluation  Patient identified by MRN, date of birth, ID band Patient awake    Reviewed: Allergy & Precautions, H&P , NPO status , Patient's Chart, lab work & pertinent test results, reviewed documented beta blocker date and time   History of Anesthesia Complications (+) PONV and history of anesthetic complications  Airway Mallampati: II  TM Distance: >3 FB Neck ROM: full    Dental no notable dental hx.    Pulmonary sleep apnea and Continuous Positive Airway Pressure Ventilation , former smoker,    Pulmonary exam normal breath sounds clear to auscultation       Cardiovascular Exercise Tolerance: Good hypertension, negative cardio ROS   Rhythm:regular Rate:Normal     Neuro/Psych  Headaches, negative psych ROS   GI/Hepatic Neg liver ROS, GERD  Medicated,  Endo/Other  negative endocrine ROS  Renal/GU negative Renal ROS  negative genitourinary   Musculoskeletal   Abdominal   Peds  Hematology negative hematology ROS (+)   Anesthesia Other Findings   Reproductive/Obstetrics negative OB ROS                             Anesthesia Physical Anesthesia Plan  ASA: 2  Anesthesia Plan: General   Post-op Pain Management:    Induction:   PONV Risk Score and Plan: Propofol infusion  Airway Management Planned:   Additional Equipment:   Intra-op Plan:   Post-operative Plan:   Informed Consent: I have reviewed the patients History and Physical, chart, labs and discussed the procedure including the risks, benefits and alternatives for the proposed anesthesia with the patient or authorized representative who has indicated his/her understanding and acceptance.     Dental Advisory Given  Plan Discussed with: CRNA  Anesthesia Plan Comments:         Anesthesia Quick Evaluation

## 2021-02-09 ENCOUNTER — Other Ambulatory Visit: Payer: Self-pay

## 2021-02-09 ENCOUNTER — Ambulatory Visit (HOSPITAL_COMMUNITY): Payer: Managed Care, Other (non HMO) | Admitting: Anesthesiology

## 2021-02-09 ENCOUNTER — Encounter (HOSPITAL_COMMUNITY): Payer: Self-pay | Admitting: Gastroenterology

## 2021-02-09 ENCOUNTER — Ambulatory Visit (HOSPITAL_COMMUNITY)
Admission: RE | Admit: 2021-02-09 | Discharge: 2021-02-09 | Disposition: A | Discharge status: Home / Self Care | Payer: Managed Care, Other (non HMO) | Source: Ambulatory Visit | Attending: Gastroenterology | Admitting: Gastroenterology

## 2021-02-09 ENCOUNTER — Encounter (HOSPITAL_COMMUNITY)
Admission: RE | Disposition: A | Discharge status: Home / Self Care | Payer: Self-pay | Source: Ambulatory Visit | Attending: Gastroenterology

## 2021-02-09 DIAGNOSIS — G4730 Sleep apnea, unspecified: Secondary | ICD-10-CM | POA: Insufficient documentation

## 2021-02-09 DIAGNOSIS — K219 Gastro-esophageal reflux disease without esophagitis: Secondary | ICD-10-CM | POA: Diagnosis not present

## 2021-02-09 DIAGNOSIS — I10 Essential (primary) hypertension: Secondary | ICD-10-CM | POA: Diagnosis not present

## 2021-02-09 DIAGNOSIS — K5730 Diverticulosis of large intestine without perforation or abscess without bleeding: Secondary | ICD-10-CM

## 2021-02-09 DIAGNOSIS — D125 Benign neoplasm of sigmoid colon: Secondary | ICD-10-CM

## 2021-02-09 DIAGNOSIS — D123 Benign neoplasm of transverse colon: Secondary | ICD-10-CM

## 2021-02-09 DIAGNOSIS — R519 Headache, unspecified: Secondary | ICD-10-CM | POA: Diagnosis not present

## 2021-02-09 DIAGNOSIS — Z1211 Encounter for screening for malignant neoplasm of colon: Secondary | ICD-10-CM

## 2021-02-09 DIAGNOSIS — Z87891 Personal history of nicotine dependence: Secondary | ICD-10-CM | POA: Diagnosis not present

## 2021-02-09 DIAGNOSIS — D120 Benign neoplasm of cecum: Secondary | ICD-10-CM

## 2021-02-09 DIAGNOSIS — D124 Benign neoplasm of descending colon: Secondary | ICD-10-CM | POA: Diagnosis not present

## 2021-02-09 DIAGNOSIS — D122 Benign neoplasm of ascending colon: Secondary | ICD-10-CM

## 2021-02-09 DIAGNOSIS — K621 Rectal polyp: Secondary | ICD-10-CM | POA: Diagnosis not present

## 2021-02-09 DIAGNOSIS — K648 Other hemorrhoids: Secondary | ICD-10-CM

## 2021-02-09 HISTORY — PX: POLYPECTOMY: SHX5525

## 2021-02-09 HISTORY — PX: BIOPSY: SHX5522

## 2021-02-09 HISTORY — PX: COLONOSCOPY WITH PROPOFOL: SHX5780

## 2021-02-09 LAB — HM COLONOSCOPY

## 2021-02-09 SURGERY — COLONOSCOPY WITH PROPOFOL
Anesthesia: General

## 2021-02-09 MED ORDER — LACTATED RINGERS IV SOLN: INTRAVENOUS | Status: DC

## 2021-02-09 MED ORDER — PROPOFOL 10 MG/ML IV BOLUS
INTRAVENOUS | Status: DC | PRN
  Administered 2021-02-09 (×2): 50 mg via INTRAVENOUS
  Administered 2021-02-09: 70 mg via INTRAVENOUS
  Administered 2021-02-09: 100 mg via INTRAVENOUS

## 2021-02-09 MED ORDER — LIDOCAINE HCL (CARDIAC) PF 100 MG/5ML IV SOSY
PREFILLED_SYRINGE | INTRAVENOUS | Status: DC | PRN
  Administered 2021-02-09: 50 mg via INTRAVENOUS

## 2021-02-09 MED ORDER — PROPOFOL 500 MG/50ML IV EMUL
INTRAVENOUS | Status: DC | PRN
  Administered 2021-02-09: 100 ug/kg/min via INTRAVENOUS

## 2021-02-09 NOTE — Discharge Instructions (Signed)
You are being discharged to home.  Resume your previous diet.  We are waiting for your pathology results.  Your physician has recommended a repeat colonoscopy (date to be determined after pending pathology results are reviewed) for surveillance.  

## 2021-02-09 NOTE — Anesthesia Postprocedure Evaluation (Signed)
Anesthesia Post Note  Patient: Troy Franklin  Procedure(s) Performed: COLONOSCOPY WITH PROPOFOL BIOPSY POLYPECTOMY  Patient location during evaluation: Phase II Anesthesia Type: General Level of consciousness: awake Pain management: pain level controlled Vital Signs Assessment: post-procedure vital signs reviewed and stable Respiratory status: spontaneous breathing and respiratory function stable Cardiovascular status: blood pressure returned to baseline and stable Postop Assessment: no headache and no apparent nausea or vomiting Anesthetic complications: no Comments: Late entry   No notable events documented.   Last Vitals:  Vitals:   02/09/21 0637 02/09/21 0824  BP: (!) 142/87 134/89  Pulse:  69  Resp:  20  Temp:  36.5 C  SpO2:  99%    Last Pain:  Vitals:   02/09/21 0824  TempSrc: Oral  PainSc: 0-No pain                 Louann Sjogren

## 2021-02-09 NOTE — Transfer of Care (Signed)
Immediate Anesthesia Transfer of Care Note  Patient: Troy Franklin  Procedure(s) Performed: COLONOSCOPY WITH PROPOFOL BIOPSY POLYPECTOMY  Patient Location: Endoscopy Unit  Anesthesia Type:General  Level of Consciousness: awake  Airway & Oxygen Therapy: Patient Spontanous Breathing  Post-op Assessment: Report given to RN and Post -op Vital signs reviewed and stable  Post vital signs: Reviewed and stable  Last Vitals:  Vitals Value Taken Time  BP 134/89 02/09/21 0824  Temp 36.5 C 02/09/21 0824  Pulse 69 02/09/21 0824  Resp 20 02/09/21 0824  SpO2 99 % 02/09/21 0824    Last Pain:  Vitals:   02/09/21 0824  TempSrc: Oral  PainSc: 0-No pain      Patients Stated Pain Goal: 8 (19/47/12 5271)  Complications: No notable events documented.

## 2021-02-09 NOTE — H&P (Signed)
Troy Franklin is an 50 y.o. male.   Chief Complaint: Screening colonoscopy HPI: 50 year old male with past medical history of GERD, hypertension, sleep apnea, coming for screening colonoscopy. The patient has never had a colonoscopy in the past.  The patient denies having any complaints such as melena, hematochezia, abdominal pain or distention, change in her bowel movement consistency or frequency, no changes in her weight recently.  No family history of colorectal cancer.   Past Medical History:  Diagnosis Date   Arthritis    lumbar   Complication of anesthesia    difficulty waking up- sleepiness lasted longer than other times.    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   Hypertension    PONV (postoperative nausea and vomiting)    Sleep apnea    Cpap   Spondylolisthesis of lumbar region     Past Surgical History:  Procedure Laterality Date   ANKLE FRACTURE SURGERY     right - pins and screws   BACK SURGERY     fusion 2014, laminectomy 2015, laminectomy and fusion 2019, fusion 2021   CERVICAL FUSION  2013   CERVICAL FUSION     2007 & 2013 & March 2022   [place]   hydrocele   KNEE LIGAMENT RECONSTRUCTION     right [place] MICRODISCECTOMY N/A 10/10/2017   Procedure: Lumbar Three-Four Laminectomy;  Surgeon: Ashok Pall, MD;  Location: [facility];  Service: Neurosurgery;  Laterality: N/A;   VASECTOMY      Family History  Problem Relation Age of Onset   Colon cancer Neg Hx    Social History:  reports that he quit smoking about 2 years ago. His smoking use included cigarettes. He has a 1.00 pack-year smoking history. He has never used smokeless tobacco. He reports current alcohol use. He reports that he does not use drugs.  Allergies:  Allergies  Allergen Reactions   Onion Other (See Comments)    Migraines     Medications Prior to Admission  Medication Sig Dispense Refill    acetaminophen (TYLENOL) 500 MG tablet Take 1,000 mg by mouth every 6 (six) hours as needed for moderate pain or mild pain.     ibuprofen (ADVIL) 200 MG tablet Take 800 mg by mouth every 6 (six) hours as needed.     losartan-hydrochlorothiazide (HYZAAR) 50-12.5 MG tablet Take 1 tablet by mouth daily. 90 tablet 1   UNABLE TO FIND Med Name: AutoPAP device. Use nightly for obstructive sleep apnea. 1 Device 0    Results for orders placed or performed during the hospital encounter of 02/07/21 (from the past 48 hour(s))  Basic metabolic panel     Status: Abnormal   Collection Time: 02/07/21  9:19 AM  Result Value Ref Range   Sodium 137 135 - 145 mmol/L   Potassium 4.5 3.5 - 5.1 mmol/L   Chloride 104 98 - 111 mmol/L   CO2 26 22 - 32 mmol/L   Glucose, Bld 107 (H) 70 - 99 mg/dL    Comment: Glucose reference range applies only to samples taken after fasting for at least 8 hours.   BUN 12 6 - 20 mg/dL   Creatinine, Ser 0.99 0.61 - 1.24 mg/dL   Calcium 9.8 8.9 - 10.3 mg/dL   GFR, Estimated >60 >60 mL/min    Comment: (NOTE) Calculated using the CKD-EPI Creatinine Equation (2021)    Anion gap 7 5 - 15  Comment: Performed at [facility], [address], [place], [place] [address]   No results found.  Review of Systems  Constitutional: Negative.   HENT: Negative.    Eyes: Negative.   Respiratory: Negative.    Cardiovascular: Negative.   Gastrointestinal: Negative.   Endocrine: Negative.   Genitourinary: Negative.   Musculoskeletal: Negative.   Skin: Negative.   Allergic/Immunologic: Negative.   Neurological: Negative.   Hematological: Negative.   Psychiatric/Behavioral: Negative.     Blood pressure (!) 142/87, pulse 64, temperature 98.3 F (36.8 C), temperature source Oral, resp. rate 18, height 5\' 7"  (1.702 m), weight 102.1 kg, SpO2 96 %. Physical Exam  GENERAL: The patient is AO x3, in no acute distress. HEENT: Head is normocephalic and atraumatic. EOMI are intact. Mouth is well  hydrated and without lesions. NECK: Supple. No masses LUNGS: Clear to auscultation. No presence of rhonchi/wheezing/rales. Adequate chest expansion HEART: RRR, normal s1 and s2. ABDOMEN: Soft, nontender, no guarding, no peritoneal signs, and nondistended. BS +. No masses. EXTREMITIES: Without any cyanosis, clubbing, rash, lesions or edema. NEUROLOGIC: AOx3, no focal motor deficit. SKIN: no jaundice, no rashes  Assessment/Plan 49 year old male with past medical history of GERD, hypertension, sleep apnea, coming for screening colonoscopy. The patient is at average risk for colorectal cancer.  We will proceed with colonoscopy today.   Harvel Quale, MD 02/09/2021, 7:33 AM

## 2021-02-09 NOTE — Anesthesia Procedure Notes (Signed)
Date/Time: 02/09/2021 7:45 AM Performed by: Orlie Dakin, CRNA Pre-anesthesia Checklist: Patient identified, Emergency Drugs available, Suction available and Patient being monitored Patient Re-evaluated:Patient Re-evaluated prior to induction Oxygen Delivery Method: Nasal cannula Induction Type: IV induction Placement Confirmation: positive ETCO2

## 2021-02-09 NOTE — Op Note (Signed)
[facility] Patient Name: Troy Franklin Procedure Date: 02/09/2021 7:14 AM MRN: [id number] Date of Birth: June 17, 1970 Attending MD: Maylon Peppers ,  CSN: [id number] Age: 50 Admit Type: Outpatient Procedure:                Colonoscopy Indications:              Screening for colorectal malignant neoplasm Providers:                Maylon Peppers, Janeece Riggers, RN, Kristine L.                            Risa Grill, Technician Referring MD:              Medicines:                Monitored Anesthesia Care Complications:            No immediate complications. Estimated Blood Loss:     Estimated blood loss: none. Procedure:                Pre-Anesthesia Assessment:                           - Prior to the procedure, a History and Physical                            was performed, and patient medications, allergies                            and sensitivities were reviewed. The patient's                            tolerance of previous anesthesia was reviewed.                           - The risks and benefits of the procedure and the                            sedation options and risks were discussed with the                            patient. All questions were answered and informed                            consent was obtained.                           - ASA Grade Assessment: II - A patient with mild                            systemic disease.                           After obtaining informed consent, the colonoscope                            was passed under direct vision. Throughout the  procedure, the patient's blood pressure, pulse, and                            oxygen saturations were monitored continuously. The                            [id number]-HQ190L ([id number]) scope was introduced through                            the anus and advanced to the the cecum, identified                            by appendiceal orifice and ileocecal valve. The                             colonoscopy was performed without difficulty. The                            patient tolerated the procedure well. Scope In: 7:41:58 AM Scope Out: 8:20:46 AM Scope Withdrawal Time: 0 hours 28 minutes 41 seconds  Total Procedure Duration: 0 hours 38 minutes 48 seconds  Findings:      The perianal and digital rectal examinations were normal.      A 1 mm polyp was found in the cecum. The polyp was sessile. The polyp       was removed with a cold biopsy forceps. Resection and retrieval were       complete.      Four sessile polyps were found in the rectum, sigmoid colon, transverse       colon and ascending colon. The polyps were 2 to 5 mm in size. These       polyps were removed with a cold snare. Resection and retrieval were       complete.      A few small-mouthed diverticula were found in the sigmoid colon.      Non-bleeding internal hemorrhoids were found during retroflexion. The       hemorrhoids were small. Impression:               - One 1 mm polyp in the cecum, removed with a cold                            biopsy forceps. Resected and retrieved.                           - Four 2 to 5 mm polyps in the rectum, in the                            sigmoid colon, in the transverse colon and in the                            ascending colon, removed with a cold snare.                            Resected and retrieved.                           -  Diverticulosis in the sigmoid colon.                           - Non-bleeding internal hemorrhoids. Moderate Sedation:      Per Anesthesia Care Recommendation:           - Discharge patient to home (ambulatory).                           - Resume previous diet.                           - Await pathology results.                           - Repeat colonoscopy date to be determined after                            pending pathology results are reviewed for                            surveillance. Procedure Code(s):         --- Professional ---                           [phone], Colonoscopy, flexible; with removal of                            tumor(s), polyp(s), or other lesion(s) by snare                            technique                           45380, 50, Colonoscopy, flexible; with biopsy,                            single or multiple Diagnosis Code(s):        --- Professional ---                           Z12.11, Encounter for screening for malignant                            neoplasm of colon                           K63.5, Polyp of colon                           K62.1, Rectal polyp                           K64.8, Other hemorrhoids                           K57.30, Diverticulosis of large intestine without  perforation or abscess without bleeding CPT copyright 2019 American Medical Association. All rights reserved. The codes documented in this report are preliminary and upon coder review may  be revised to meet current compliance requirements. Maylon Peppers, MD Maylon Peppers,  02/09/2021 8:28:03 AM This report has been signed electronically. Number of Addenda: 0

## 2021-02-10 ENCOUNTER — Encounter (HOSPITAL_COMMUNITY): Payer: Self-pay | Admitting: Gastroenterology

## 2021-02-10 ENCOUNTER — Encounter (INDEPENDENT_AMBULATORY_CARE_PROVIDER_SITE_OTHER): Payer: Self-pay | Admitting: *Deleted

## 2021-02-10 LAB — SURGICAL PATHOLOGY

## 2021-03-03 ENCOUNTER — Ambulatory Visit: Payer: Managed Care, Other (non HMO) | Admitting: Nurse Practitioner

## 2021-03-03 ENCOUNTER — Other Ambulatory Visit: Payer: Self-pay

## 2021-03-03 ENCOUNTER — Encounter: Payer: Self-pay | Admitting: Nurse Practitioner

## 2021-03-03 VITALS — BP 125/83 | HR 75 | Ht 67.0 in | Wt 217.1 lb

## 2021-03-03 DIAGNOSIS — E7800 Pure hypercholesterolemia, unspecified: Secondary | ICD-10-CM

## 2021-03-03 DIAGNOSIS — I10 Essential (primary) hypertension: Secondary | ICD-10-CM | POA: Diagnosis not present

## 2021-03-03 LAB — CBC WITH DIFFERENTIAL/PLATELET
Basophils Absolute: 0.1 10*3/uL (ref 0.0–0.2)
Basos: 1 %
EOS (ABSOLUTE): 0.1 10*3/uL (ref 0.0–0.4)
Eos: 2 %
Hematocrit: 46.3 % (ref 37.5–51.0)
Hemoglobin: 15.9 g/dL (ref 13.0–17.7)
Immature Grans (Abs): 0 10*3/uL (ref 0.0–0.1)
Immature Granulocytes: 0 %
Lymphocytes Absolute: 2.1 10*3/uL (ref 0.7–3.1)
Lymphs: 41 %
MCH: 30.9 pg (ref 26.6–33.0)
MCHC: 34.3 g/dL (ref 31.5–35.7)
MCV: 90 fL (ref 79–97)
Monocytes Absolute: 0.6 10*3/uL (ref 0.1–0.9)
Monocytes: 11 %
Neutrophils Absolute: 2.3 10*3/uL (ref 1.4–7.0)
Neutrophils: 45 %
Platelets: 150 10*3/uL (ref 150–450)
RBC: 5.15 x10E6/uL (ref 4.14–5.80)
RDW: 11.8 % (ref 11.6–15.4)
WBC: 5.1 10*3/uL (ref 3.4–10.8)

## 2021-03-03 LAB — CMP14+EGFR
ALT: 31 IU/L (ref 0–44)
AST: 23 IU/L (ref 0–40)
Albumin/Globulin Ratio: 2.2 (ref 1.2–2.2)
Albumin: 4.9 g/dL (ref 3.8–4.9)
Alkaline Phosphatase: 79 IU/L (ref 44–121)
BUN/Creatinine Ratio: 12 (ref 9–20)
BUN: 13 mg/dL (ref 6–24)
Bilirubin Total: 1.3 mg/dL — ABNORMAL HIGH (ref 0.0–1.2)
CO2: 25 mmol/L (ref 20–29)
Calcium: 9.8 mg/dL (ref 8.7–10.2)
Chloride: 100 mmol/L (ref 96–106)
Creatinine, Ser: 1.1 mg/dL (ref 0.76–1.27)
Globulin, Total: 2.2 g/dL (ref 1.5–4.5)
Glucose: 103 mg/dL — ABNORMAL HIGH (ref 70–99)
Potassium: 4.7 mmol/L (ref 3.5–5.2)
Sodium: 138 mmol/L (ref 134–144)
Total Protein: 7.1 g/dL (ref 6.0–8.5)
eGFR: 81 mL/min/{1.73_m2} (ref >59)

## 2021-03-03 LAB — LIPID PANEL WITH LDL/HDL RATIO
Cholesterol, Total: 131 mg/dL (ref 100–199)
HDL: 39 mg/dL — ABNORMAL LOW (ref >39)
LDL Chol Calc (NIH): 74 mg/dL (ref 0–99)
LDL/HDL Ratio: 1.9 ratio (ref 0.0–3.6)
Triglycerides: 93 mg/dL (ref 0–149)
VLDL Cholesterol Cal: 18 mg/dL (ref 5–40)

## 2021-03-03 MED ORDER — OLMESARTAN MEDOXOMIL 20 MG PO TABS: 20.0000 mg | ORAL_TABLET | Freq: Every day | ORAL | 3 refills | Status: DC

## 2021-03-03 NOTE — Assessment & Plan Note (Signed)
Lab Results  Component Value Date   CHOL 131 03/02/2021   HDL 39 (L) 03/02/2021   LDLCALC 74 03/02/2021   TRIG 93 03/02/2021   -lipids look great

## 2021-03-03 NOTE — Patient Instructions (Signed)
I will be moving to [facility] located at [address], [place], [place] [address] effective Mar 13, 2021. °If you would like to establish care with [facility] please call [phone]. °

## 2021-03-03 NOTE — Progress Notes (Signed)
Glucose was slightly elevated. I'll add an A1c to labs, but otherwise labs look great, and I'll see him at his appt today. No need to call.

## 2021-03-03 NOTE — Progress Notes (Signed)
Acute Office Visit  Subjective:    Patient ID: Troy Franklin, male    DOB: Sep 22, 1970, 50 y.o.   MRN: [id number]  Chief Complaint  Patient presents with   Follow-up    HPI Patient is in today for lab follow-up for HTN and HLD.  He has been losing weight, and he states that his BP was 110/65 at times, so he doesn't take his BP meds regularly.  Past Medical History:  Diagnosis Date   Arthritis    lumbar   Complication of anesthesia    difficulty waking up- sleepiness lasted longer than other times.    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   Hypertension    PONV (postoperative nausea and vomiting)    Sleep apnea    Cpap   Spondylolisthesis of lumbar region     Past Surgical History:  Procedure Laterality Date   ANKLE FRACTURE SURGERY     right - pins and screws   BACK SURGERY     fusion 2014, laminectomy 2015, laminectomy and fusion 2019, fusion 2021   BIOPSY  02/09/2021   Procedure: BIOPSY;  Surgeon: Harvel Quale, MD;  [facility];  Service: Gastroenterology;;   CERVICAL FUSION  2013   CERVICAL FUSION     2007 & 2013 & March 2022   COLONOSCOPY WITH PROPOFOL N/A 02/09/2021   Procedure: COLONOSCOPY WITH PROPOFOL;  Surgeon: Harvel Quale, MD;  [facility]: [facility];  Service: Gastroenterology;  Laterality: N/A;  7:30   HERNIA REPAIR  1974   hydrocele   KNEE LIGAMENT RECONSTRUCTION     right 1987   LUMBAR LAMINECTOMY/DECOMPRESSION MICRODISCECTOMY N/A 10/10/2017   Procedure: Lumbar Three-Four Laminectomy;  Surgeon: Ashok Pall, MD;  Location: [facility];  Service: Neurosurgery;  Laterality: N/A;   POLYPECTOMY  02/09/2021   Procedure: POLYPECTOMY;  Surgeon: Montez Morita, Quillian Quince, MD;  [facility]: [facility];  Service: Gastroenterology;;   VASECTOMY      Family History  Problem Relation Age of Onset   Colon cancer Neg Hx     Social History   Socioeconomic History   Marital  status: Married    Spouse name: Not on file   Number of children: 1   Years of education: Not on file   Highest education level: Not on file  Occupational History   Occupation: [employer]    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  Tobacco Use   Smoking status: Former    Packs/day: 0.25    Years: 4.00    Pack years: 1.00    Types: Cigarettes    Quit date: 12/16/2018    Years since quitting: 2.2   Smokeless tobacco: Never  Vaping Use   Vaping Use: Never used  Substance and Sexual Activity   Alcohol use: Yes    Comment: socially- x1/month   Drug use: No   Sexual activity: Yes  Other Topics Concern   Not on file  Social History Narrative   1 child, 1 stepchild   Social Determinants of Health   Financial Resource Strain: Not on file  Food Insecurity: Not on file  Transportation Needs: Not on file  Physical Activity: Not on file  Stress: Not on file  Social Connections: Not on file  Intimate Partner Violence: Not on file    Outpatient Medications Prior to Visit  Medication Sig Dispense Refill   acetaminophen (TYLENOL) 500 MG tablet Take 1,000 mg by mouth every  6 (six) hours as needed for moderate pain or mild pain.     ibuprofen (ADVIL) 200 MG tablet Take 800 mg by mouth every 6 (six) hours as needed.     UNABLE TO FIND Med Name: AutoPAP device. Use nightly for obstructive sleep apnea. 1 Device 0   losartan-hydrochlorothiazide (HYZAAR) 50-12.5 MG tablet Take 1 tablet by mouth daily. 90 tablet 1   No facility-administered medications prior to visit.    Allergies  Allergen Reactions   Onion Other (See Comments)    Migraines     Review of Systems  Constitutional: Negative.   Respiratory: Negative.    Cardiovascular: Negative.   Musculoskeletal:  Positive for back pain.       Chronic  Psychiatric/Behavioral: Negative.        Objective:    Physical Exam Constitutional:      Appearance: Normal appearance. He is obese.  Cardiovascular:     Rate and  Rhythm: Normal rate and regular rhythm.     Pulses: Normal pulses.     Heart sounds: Normal heart sounds.  Pulmonary:     Effort: Pulmonary effort is normal.     Breath sounds: Normal breath sounds.  Neurological:     Mental Status: He is alert.  Psychiatric:        Mood and Affect: Mood normal.        Behavior: Behavior normal.        Thought Content: Thought content normal.        Judgment: Judgment normal.    BP 125/83    Pulse 75    Ht $R'5\' 7"'iJ$  (1.702 m)    Wt 217 lb 1.9 oz (98.5 kg)    SpO2 95%    BMI 34.01 kg/m  Wt Readings from Last 3 Encounters:  03/03/21 217 lb 1.9 oz (98.5 kg)  02/09/21 225 lb (102.1 kg)  01/03/21 230 lb 1.9 oz (104.4 kg)    Health Maintenance Due  Topic Date Due   Pneumococcal Vaccine 46-41 Years old (1 - PCV) Never done   TETANUS/TDAP  Never done   Zoster Vaccines- Shingrix (1 of 2) Never done   COVID-19 Vaccine (3 - Pfizer risk series) 08/08/2019    There are no preventive care reminders to display for this patient.   Lab Results  Component Value Date   TSH 2.200 08/27/2020   Lab Results  Component Value Date   WBC 5.1 03/02/2021   HGB 15.9 03/02/2021   HCT 46.3 03/02/2021   MCV 90 03/02/2021   PLT 150 03/02/2021   Lab Results  Component Value Date   NA 138 03/02/2021   K 4.7 03/02/2021   CO2 25 03/02/2021   GLUCOSE 103 (H) 03/02/2021   BUN 13 03/02/2021   CREATININE 1.10 03/02/2021   BILITOT 1.3 (H) 03/02/2021   ALKPHOS 79 03/02/2021   AST 23 03/02/2021   ALT 31 03/02/2021   PROT 7.1 03/02/2021   ALBUMIN 4.9 03/02/2021   CALCIUM 9.8 03/02/2021   ANIONGAP 7 02/07/2021   EGFR 81 03/02/2021   Lab Results  Component Value Date   CHOL 131 03/02/2021   Lab Results  Component Value Date   HDL 39 (L) 03/02/2021   Lab Results  Component Value Date   LDLCALC 74 03/02/2021   Lab Results  Component Value Date   TRIG 93 03/02/2021   No results found for: CHOLHDL No results found for: HGBA1C     Assessment & Plan:    Problem List  Items Addressed This Visit       Cardiovascular and Mediastinum   HTN (hypertension) - Primary    BP Readings from Last 3 Encounters:  03/03/21 125/83  02/09/21 134/89  01/03/21 130/78  -he hasn't been taking his losartan-HCTZ d/t low BPs at home -he states he hasn't been dizzy, but he was getting BPs of 110/65 -STOP losartan-HCTZ -Rx. Olmesartan; if home BPs are too low, would cut dose in half      Relevant Medications   olmesartan (BENICAR) 20 MG tablet     Other   Hyperlipidemia    Lab Results  Component Value Date   CHOL 131 03/02/2021   HDL 39 (L) 03/02/2021   LDLCALC 74 03/02/2021   TRIG 93 03/02/2021  -lipids look great      Relevant Medications   olmesartan (BENICAR) 20 MG tablet     Meds ordered this encounter  Medications   olmesartan (BENICAR) 20 MG tablet    Sig: Take 1 tablet (20 mg total) by mouth daily.    Dispense:  90 tablet    Refill:  3    STOP losartan-HCTZ     Noreene Larsson, NP

## 2021-03-03 NOTE — Assessment & Plan Note (Addendum)
BP Readings from Last 3 Encounters:  03/03/21 125/83  02/09/21 134/89  01/03/21 130/78   -he hasn't been taking his losartan-HCTZ d/t low BPs at home -he states he hasn't been dizzy, but he was getting BPs of 110/65 -STOP losartan-HCTZ -Rx. Olmesartan; if home BPs are too low, would cut dose in half

## 2021-03-05 LAB — HGB A1C W/O EAG: Hgb A1c MFr Bld: 6 % — ABNORMAL HIGH (ref 4.8–5.6)

## 2021-03-05 LAB — SPECIMEN STATUS REPORT

## 2021-03-08 NOTE — Progress Notes (Signed)
A1c was good at 6.0. That is in the prediabetic range. No need to add or change medicines. Try to increase exercise and cut back on carb consumption.

## 2021-05-15 IMAGING — RF DG C-ARM 1-60 MIN
1 series · 3 of 3 positions shown · non-contrast
Comparison: March 11, 2017

CLINICAL DATA: L2-3 PLIF.

FLUOROSCOPY TIME:  1 minutes 4 seconds.
Images: 3
EXAM:
LUMBAR SPINE - 2-3 VIEW

[Series 1: run · 3 of 3 slices shown]
[im 1/3]
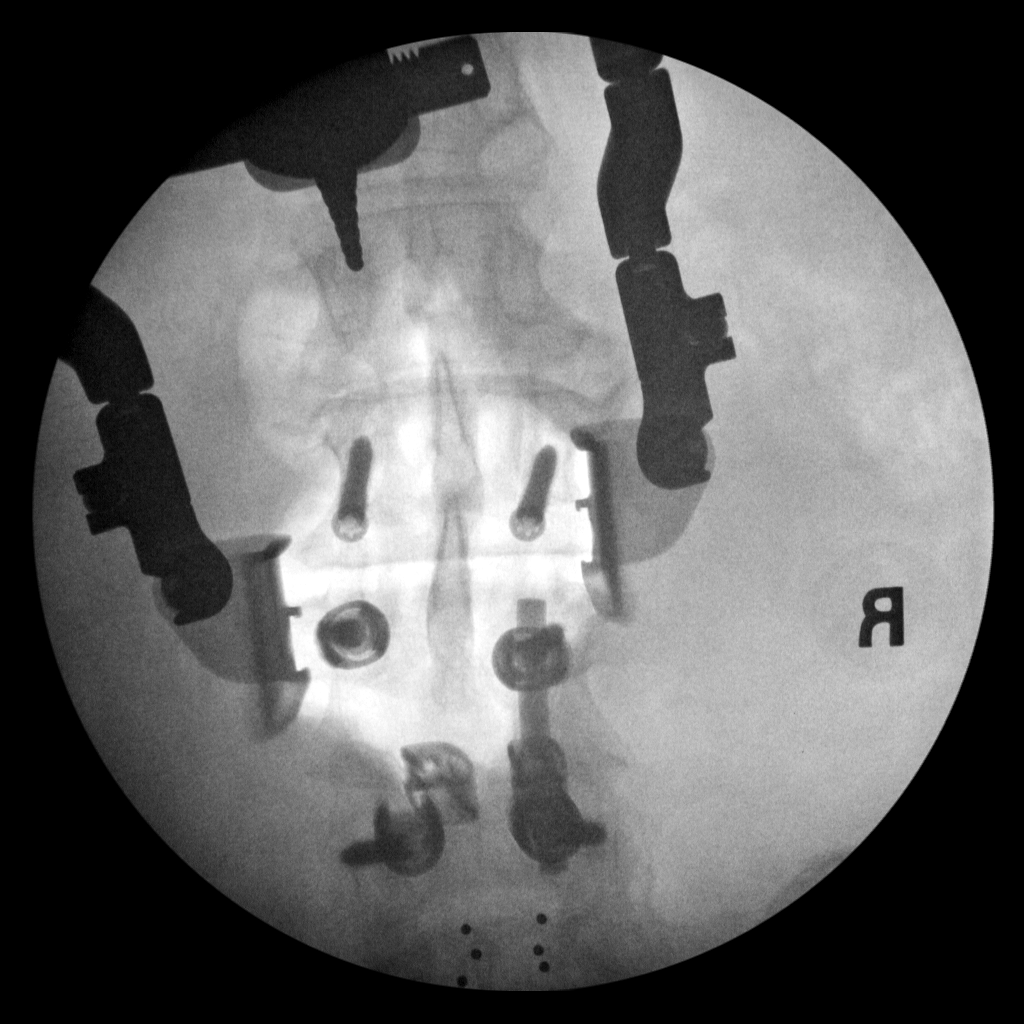
[im 2/3]
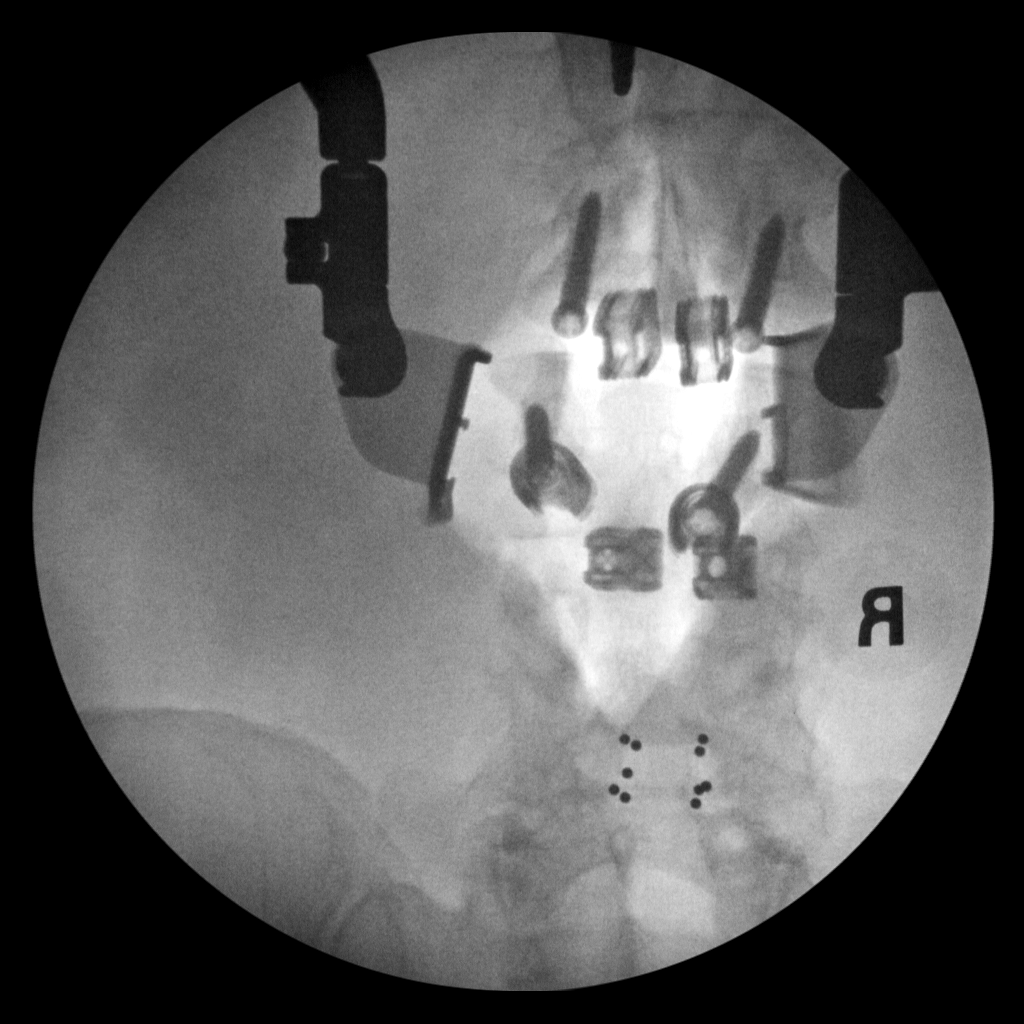
[im 3/3]
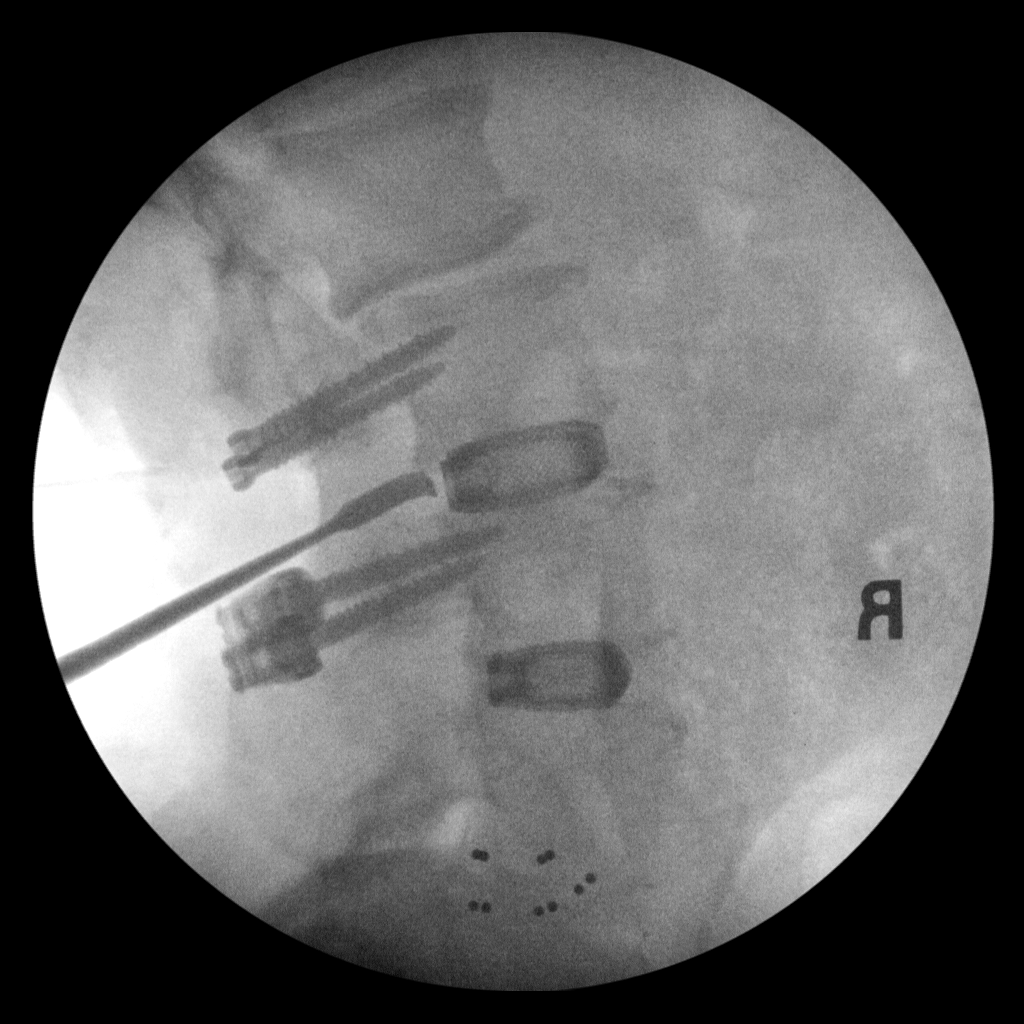

[3 of 3 positions shown; findings below may reference images not displayed]

FINDINGS: Throughout the study, pedicle screws seen previously at L4 have been
removed. A disc spacer device is seen at L4-5, stable. The disc
spacer device at L3-4 is stable. There is a new disc spacer device
at L2-3. Pedicle screws remain at L3. New pedicle screws are seen at
L2.
IMPRESSION: Hardware revisions/placement as above.

## 2021-06-28 ENCOUNTER — Telehealth: Payer: Self-pay | Admitting: Pulmonary Disease

## 2021-06-28 DIAGNOSIS — G4733 Obstructive sleep apnea (adult) (pediatric): Secondary | ICD-10-CM

## 2021-06-28 NOTE — Telephone Encounter (Signed)
Called and spoke to patient. He states that [facility] told him he would need to pay $1000 overall to rent new cpap machine. Would like to keep original cpap and just have pressures adjusted.  Patient not available in resmed so called [employer] and lvm for RT asking for Cpap DL. Once DL is received will send a message to Dr. Halford Chessman asking about pressure adjustments  ?

## 2021-06-29 NOTE — Telephone Encounter (Signed)
CPAP DL report received. Faxed to [place]. Routing to Dr. Halford Chessman so hes aware to look out for DL and to see if he will adjust pressures.  ?

## 2021-06-29 NOTE — Telephone Encounter (Signed)
Mariann Laster from [place] confirms patient is coming in at 11:30 today to get a DL report and she will fax to [place].  ?

## 2021-06-30 NOTE — Telephone Encounter (Signed)
I never received the fax.  I am in [place] office this morning, and will be in [place] office next week. ?

## 2021-06-30 NOTE — Telephone Encounter (Signed)
Called and spoke to patient. He voiced understanding and would like pressures increased. Order placed. Let patient know we are reaching out to confirm with [employer] pricing of cpap and he voiced understanding but would still like to use current cpap for now. Nothing further needed.  ?

## 2021-06-30 NOTE — Telephone Encounter (Signed)
CPAP 03/31/21 to 06/28/21 >> used on 90 of 90 nights with average 8 hrs 4 min.  Average AHI 1.7 with CPAP 11 cm H2O. ? ? ?Please let him know his CPAP report shows good control of sleep apnea with CPAP at 11 cm H2O.  If he feels his pressure is too low, then can send order to increase his CPAP to 12 cm H2O. ? ?Can you please also check with [employer] to confirm that he would have to pay $1000 dollars to get a new CPAP machine from them.  He could purchase a CPAP on his own for less amount. ?

## 2021-08-17 ENCOUNTER — Ambulatory Visit: Payer: Managed Care, Other (non HMO) | Admitting: Pulmonary Disease

## 2021-09-01 ENCOUNTER — Encounter: Payer: Self-pay | Admitting: Nurse Practitioner

## 2021-09-01 ENCOUNTER — Ambulatory Visit: Payer: Managed Care, Other (non HMO) | Admitting: Nurse Practitioner

## 2021-09-01 VITALS — BP 142/98 | HR 64 | Ht 67.0 in | Wt 236.0 lb

## 2021-09-01 DIAGNOSIS — R7303 Prediabetes: Secondary | ICD-10-CM | POA: Diagnosis not present

## 2021-09-01 DIAGNOSIS — E669 Obesity, unspecified: Secondary | ICD-10-CM | POA: Insufficient documentation

## 2021-09-01 DIAGNOSIS — I10 Essential (primary) hypertension: Secondary | ICD-10-CM

## 2021-09-01 DIAGNOSIS — M65321 Trigger finger, right index finger: Secondary | ICD-10-CM | POA: Diagnosis not present

## 2021-09-01 DIAGNOSIS — Z6835 Body mass index (BMI) 35.0-35.9, adult: Secondary | ICD-10-CM

## 2021-09-01 NOTE — Patient Instructions (Signed)
Please start taking benicar '20mg'$  daily,monitor your blood pressure at least three times a week , blood pressure goal is less than 140/90.   It is important that you exercise regularly at least 30 minutes 5 times a week.  Think about what you will eat, plan ahead. Choose " clean, green, fresh or frozen" over canned, processed or packaged foods which are more sugary, salty and fatty. 70 to 75% of food eaten should be vegetables and fruit. Three meals at set times with snacks allowed between meals, but they must be fruit or vegetables. Aim to eat over a 12 hour period , example 7 am to 7 pm, and STOP after  your last meal of the day. Drink water,generally about 64 ounces per day, no other drink is as healthy. Fruit juice is best enjoyed in a healthy way, by EATING the fruit.  Thanks for choosing [facility], we consider it a privelige to serve you.

## 2021-09-01 NOTE — Assessment & Plan Note (Signed)
Continues to have pain and stiffness  Patient referred to orthopedics

## 2021-09-01 NOTE — Assessment & Plan Note (Signed)
Wt Readings from Last 3 Encounters:  09/01/21 236 lb (107 kg)  03/03/21 217 lb 1.9 oz (98.5 kg)  02/09/21 225 lb (102.1 kg)  He has not been exercising, also has not been paying attention to his diet Patient counseled on low carb diet, patient encouraged to engage in regular moderate exercises at least 150 minutes weekly.

## 2021-09-01 NOTE — Progress Notes (Signed)
   Troy Franklin     MRN: [id number]      DOB: 1970-09-26   HPI Troy Franklin with past medical history of hypertension, trigger finger right index finger, hyperlipidemia, obesity is here for follow up and re-evaluation of chronic medical conditions, medication management .  Hypertension .  Patient stated that he stopped taking Benicar 2 weeks ago because his blood pressure readings at home has been between 160-180. denies CP, SOB, dizziness, edema  HA .   Due for shingles vaccine and TDAP vaccine , he thinks he had TDAP vaccine 2022.  Patient deferred shingles vaccine to his next appointment.    Trigger finger, right index finger.patient complain of right index finger stiffness and tenderness since about a year ago .3 months ago he noted a small knot under his right index finger.  Making a fist makes his finger hurt.  Patient denies numbness, tingling.   ROS Denies recent fever or chills. Denies sinus pressure, nasal congestion, ear pain or sore throat. Denies chest congestion, productive cough or wheezing. Denies chest pains, palpitations and leg swelling Denies abdominal pain, nausea, vomiting,diarrhea or constipation.   Denies dysuria, frequency, hesitancy or incontinence. Denies headaches, seizures, numbness, or tingling. Denies depression, anxiety or insomnia.    PE  BP (!) 142/98   Pulse 64   Ht '5\' 7"'$  (1.702 m)   Wt 236 lb (107 kg)   SpO2 95%   BMI 36.96 kg/m   Patient alert and oriented and in no cardiopulmonary distress.  Chest: Clear to auscultation bilaterally.  CVS: S1, S2 no murmurs, no S3.Regular rate.  ABD: Soft non tender.   Ext: No edema  MS: Adequate ROM spine, shoulders, hips and knees.  Has tenderness on palpation of distal part of right index finger, no cyst palpated able to do range of motion of right hand fingers.  Skin: Intact, no ulcerations or rash noted.  Psych: Good eye contact, normal affect. Memory intact not anxious or depressed  appearing.  CNS: CN 2-12 intact, power,  normal throughout.no focal deficits noted.   Assessment & Plan Trigger finger, right index finger Continues to have pain and stiffness  Patient referred to orthopedics  Obesity Wt Readings from Last 3 Encounters:  09/01/21 236 lb (107 kg)  03/03/21 217 lb 1.9 oz (98.5 kg)  02/09/21 225 lb (102.1 kg)  He has not been exercising, also has not been paying attention to his diet Patient counseled on low carb diet, patient encouraged to engage in regular moderate exercises at least 150 minutes weekly.  HTN (hypertension) BP Readings from Last 3 Encounters:  09/01/21 (!) 142/98  03/03/21 125/83  02/09/21 134/89  Chronic condition uncontrolled He stopped taking Benicar 20 mg about 2 weeks ago Patient encouraged to start taking Benicar 20 mg daily, DASH diet advised, encouraged to engage in regular moderate exercise at least 150 minutes weekly. Monitor blood pressure at home goal is BP less than 140/90. Patient encouraged to bring his blood pressures monitor to his follow-up appointment in 4 weeks BMP before next appointment  Prediabetes Lab Results  Component Value Date   HGBA1C 6.0 (H) 03/02/2021  Check A1c before next visit Avoid sugar sweets soda

## 2021-09-01 NOTE — Assessment & Plan Note (Signed)
Lab Results  Component Value Date   HGBA1C 6.0 (H) 03/02/2021  Check A1c before next visit Avoid sugar sweets soda

## 2021-09-01 NOTE — Assessment & Plan Note (Signed)
BP Readings from Last 3 Encounters:  09/01/21 (!) 142/98  03/03/21 125/83  02/09/21 134/89  Chronic condition uncontrolled He stopped taking Benicar 20 mg about 2 weeks ago Patient encouraged to start taking Benicar 20 mg daily, DASH diet advised, encouraged to engage in regular moderate exercise at least 150 minutes weekly. Monitor blood pressure at home goal is BP less than 140/90. Patient encouraged to bring his blood pressures monitor to his follow-up appointment in 4 weeks BMP before next appointment

## 2021-09-20 ENCOUNTER — Ambulatory Visit: Payer: Managed Care, Other (non HMO) | Admitting: Orthopedic Surgery

## 2021-09-20 ENCOUNTER — Encounter: Payer: Self-pay | Admitting: Orthopedic Surgery

## 2021-09-20 VITALS — BP 146/91 | HR 69 | Ht 67.0 in | Wt 230.0 lb

## 2021-09-20 DIAGNOSIS — M65321 Trigger finger, right index finger: Secondary | ICD-10-CM

## 2021-09-20 NOTE — Progress Notes (Signed)
New Patient Visit  Assessment: Troy Franklin is a 51 y.o. male with the following: There are no diagnoses linked to this encounter.  Plan: Symptoms and physical exam most consistent with trigger finger.  We discussed etiology, and potential treatment options.  Surgery was discussed in detail, including the plan for procedure, and expected recovery.  Currently, he has pain, without active triggering.  Ideally, he is leaving town for a hunting trip in October, and he would like to be free of symptoms at that time.  We discussed some options for treatment currently, including medications and a splint.  He would like to try to avoid an injection at this time.  However, if he continues to have symptoms in the months preceding his trip, he will return to clinic.  All questions were answered, and he is amenable to this plan.  Follow-up: Return if symptoms worsen or fail to improve.  Subjective:  Chief Complaint  Patient presents with   Hand Pain    Right hand painful swollen for about a year     History of Present Illness: Troy Franklin is a 51 y.o. male who has been referred to clinic today by Vena Rua, FNP for evaluation of Right Index finger  pain.  He states he has had pain and catching sensation in the index finger for the past year.  More recently, he is only complaining of pain.  Prior to that, he would have a catching sensation on a regular basis.  His symptoms are worse in the morning.  He has not sought treatment for this previously.  He notes catching, he did not have pain.  However now that he has no catching, he notes pain at the base of the index finger.  No medications.  No prior splinting   Review of Systems: No fevers or chills No numbness or tingling No chest pain No shortness of breath No bowel or bladder dysfunction No GI distress No headaches   Medical History:  Past Medical History:  Diagnosis Date   Arthritis    lumbar   Complication of anesthesia     difficulty waking up- sleepiness lasted longer than other times.    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   Hypertension    PONV (postoperative nausea and vomiting)    Sleep apnea    Cpap   Spondylolisthesis of lumbar region     Past Surgical History:  Procedure Laterality Date   ANKLE FRACTURE SURGERY     right - pins and screws   BACK SURGERY     fusion 2014, laminectomy 2015, laminectomy and fusion 2019, fusion 2021   BIOPSY  02/09/2021   Procedure: BIOPSY;  Surgeon: Harvel Quale, MD;  Location: [facility];  Service: Gastroenterology;;   CERVICAL FUSION  2013   CERVICAL FUSION     2007 & 2013 & March 2022   COLONOSCOPY WITH PROPOFOL N/A 02/09/2021   Procedure: COLONOSCOPY WITH PROPOFOL;  Surgeon: Harvel Quale, MD;  Location: [facility];  Service: Gastroenterology;  Laterality: N/A;  7:30   HERNIA REPAIR  1974   hydrocele   KNEE LIGAMENT RECONSTRUCTION     right 1987   LUMBAR LAMINECTOMY/DECOMPRESSION MICRODISCECTOMY N/A 10/10/2017   Procedure: Lumbar Three-Four Laminectomy;  Surgeon: Ashok Pall, MD;  Location: [place];  Service: Neurosurgery;  Laterality: N/A;   POLYPECTOMY  02/09/2021   Procedure: POLYPECTOMY;  Surgeon: Harvel Quale, MD;  Location: [facility];  Service: Gastroenterology;;   VASECTOMY      Family History  Problem Relation Age of Onset   Colon cancer Neg Hx    Social History   Tobacco Use   Smoking status: Former    Packs/day: 0.25    Years: 4.00    Total pack years: 1.00    Types: Cigarettes    Quit date: 12/16/2018    Years since quitting: 2.7   Smokeless tobacco: Never  Vaping Use   Vaping Use: Never used  Substance Use Topics   Alcohol use: Yes    Comment: socially- x1/month   Drug use: No    Allergies  Allergen Reactions   Onion Other (See Comments)    Migraines     Current Meds  Medication Sig   acetaminophen (TYLENOL) 500 MG tablet  Take 1,000 mg by mouth every 6 (six) hours as needed for moderate pain or mild pain.   ibuprofen (ADVIL) 200 MG tablet Take 800 mg by mouth every 6 (six) hours as needed.   olmesartan (BENICAR) 20 MG tablet Take 1 tablet (20 mg total) by mouth daily.   UNABLE TO FIND Med Name: AutoPAP device. Use nightly for obstructive sleep apnea.    Objective: BP (!) 146/91   Pulse 69   Ht '5\' 7"'$  (1.702 m)   Wt 230 lb (104.3 kg)   BMI 36.02 kg/m   Physical Exam:  General: Alert and oriented. and No acute distress. Gait: Normal gait.  Evaluation of the right hand demonstrates no swelling.  No bruising.  No active triggering on physical exam today.  He does have tenderness to palpation of the A1 pulley to the index finger.  No additional tenderness throughout the right hand.  No evidence of atrophy.  No numbness or tingling.  2+ radial pulse.  IMAGING: No new imaging obtained today   New Medications:  No orders of the defined types were placed in this encounter.     Mordecai Rasmussen, MD  09/20/2021 3:28 PM

## 2021-09-20 NOTE — Patient Instructions (Addendum)
Trigger finger splint, available on amaz[employer]  Medicines like ibuprofen or aleve for pain  Can consider injection prior to your trip if symptoms getting worse.

## 2021-10-04 LAB — BASIC METABOLIC PANEL
BUN/Creatinine Ratio: 14 (ref 9–20)
BUN: 13 mg/dL (ref 6–24)
CO2: 23 mmol/L (ref 20–29)
Calcium: 9.2 mg/dL (ref 8.7–10.2)
Chloride: 103 mmol/L (ref 96–106)
Creatinine, Ser: 0.94 mg/dL (ref 0.76–1.27)
Glucose: 110 mg/dL — ABNORMAL HIGH (ref 70–99)
Potassium: 4.7 mmol/L (ref 3.5–5.2)
Sodium: 139 mmol/L (ref 134–144)
eGFR: 97 mL/min/{1.73_m2} (ref >59)

## 2021-10-04 LAB — HEMOGLOBIN A1C
Est. average glucose Bld gHb Est-mCnc: 126 mg/dL
Hgb A1c MFr Bld: 6 % — ABNORMAL HIGH (ref 4.8–5.6)

## 2021-10-04 NOTE — Progress Notes (Signed)
Stable labs

## 2021-10-05 ENCOUNTER — Encounter: Payer: Self-pay | Admitting: Nurse Practitioner

## 2021-10-05 ENCOUNTER — Ambulatory Visit: Payer: Managed Care, Other (non HMO) | Admitting: Nurse Practitioner

## 2021-10-05 VITALS — BP 136/82 | HR 66 | Ht 66.0 in | Wt 237.0 lb

## 2021-10-05 DIAGNOSIS — I10 Essential (primary) hypertension: Secondary | ICD-10-CM | POA: Diagnosis not present

## 2021-10-05 DIAGNOSIS — Z6836 Body mass index (BMI) 36.0-36.9, adult: Secondary | ICD-10-CM

## 2021-10-05 DIAGNOSIS — R7303 Prediabetes: Secondary | ICD-10-CM | POA: Diagnosis not present

## 2021-10-05 DIAGNOSIS — E782 Mixed hyperlipidemia: Secondary | ICD-10-CM

## 2021-10-05 DIAGNOSIS — Z23 Encounter for immunization: Secondary | ICD-10-CM | POA: Insufficient documentation

## 2021-10-05 DIAGNOSIS — Z139 Encounter for screening, unspecified: Secondary | ICD-10-CM | POA: Diagnosis not present

## 2021-10-05 MED ORDER — OLMESARTAN MEDOXOMIL 20 MG PO TABS: 20.0000 mg | ORAL_TABLET | Freq: Every day | ORAL | 3 refills | Status: DC

## 2021-10-05 NOTE — Assessment & Plan Note (Addendum)
Patient educated on CDC recommendation for the shingles vaccine. Verbal consent was obtained from the patient, vaccine administered by nurse, no sign of adverse reactions noted at this time. Patient education on arm soreness and use of tylenol or ibuprofen (if safe) for this patient  was discussed. Patient educated on the signs and symptoms of adverse effect and advise to contact the office if they occur.

## 2021-10-05 NOTE — Assessment & Plan Note (Addendum)
Wt Readings from Last 3 Encounters:  10/05/21 237 lb (107.5 kg)  09/20/21 230 lb (104.3 kg)  09/01/21 236 lb (107 kg)  Patient counseled on low-carb diet encouraged to engage in regular moderate exercises at least 150 minutes weekly. Follow-up in 5 months

## 2021-10-05 NOTE — Patient Instructions (Addendum)
Blood pressure goal is less than 140/90 Nurse please give TDAP and shingles vaccine today   It is important that you exercise regularly at least 30 minutes 5 times a week.  Think about what you will eat, plan ahead. Choose " clean, green, fresh or frozen" over canned, processed or packaged foods which are more sugary, salty and fatty. 70 to 75% of food eaten should be vegetables and fruit. Three meals at set times with snacks allowed between meals, but they must be fruit or vegetables. Aim to eat over a 12 hour period , example 7 am to 7 pm, and STOP after  your last meal of the day. Drink water,generally about 64 ounces per day, no other drink is as healthy. Fruit juice is best enjoyed in a healthy way, by EATING the fruit.  Thanks for choosing [facility], we consider it a privelige to serve you.

## 2021-10-05 NOTE — Assessment & Plan Note (Signed)
Patient educated on CDC recommendation for the TDAP vaccine. Verbal consent was obtained from the patient, vaccine administered by nurse, no sign of adverse reactions noted at this time. Patient education on arm soreness and use of tylenol or ibuprofen (if safe) for this patient  was discussed. Patient educated on the signs and symptoms of adverse effect and advise to contact the office if they occur.

## 2021-10-05 NOTE — Assessment & Plan Note (Signed)
Lab Results  Component Value Date   HGBA1C 6.0 (H) 10/03/2021  Stable chronic condition Avoid sugar sweets soda Engage in regular moderate  daily exercise

## 2021-10-05 NOTE — Assessment & Plan Note (Addendum)
BP Readings from Last 3 Encounters:  10/05/21 136/82  09/20/21 (!) 146/91  09/01/21 (!) 142/98  Chronic condition well-controlled Continue olmesartan 20 mg daily DASH diet advised engage in regular moderate exercises at least 150 minutes weekly Patient encouraged to continue to monitor blood pressure at home and report if readings are greater than 140/90. Follow-up in 5 months

## 2021-10-05 NOTE — Progress Notes (Signed)
   Troy Franklin     MRN: [id number]      DOB: 24-Feb-1971   HPI Troy Franklin with past medical history of prediabetes, hypertension, obesity, is here for follow up for hypertension  Hypertension.  Currently on olmesartan 20 mg daily, patient reported that he has been taking medication daily as ordered he denies headache, dizziness, edema.  He reports that home blood pressure reading has been less than 140/90 but he did not bring his BP logs .   Due for Tdap and shingles vaccine both vaccines given in the office today     ROS Denies recent fever or chills. Denies sinus pressure, nasal congestion, ear pain or sore throat. Denies chest congestion, productive cough or wheezing. Denies chest pains, palpitations and leg swelling Denies abdominal pain, nausea, vomiting,diarrhea or constipation.   Denies dysuria, frequency, hesitancy or incontinence. Denies headaches, seizures, numbness, or tingling. Denies depression, anxiety or insomnia.    PE  BP 136/82 (BP Location: Left Arm, Cuff Size: Large)   Pulse 66   Ht '5\' 6"'$  (1.676 m)   Wt 237 lb (107.5 kg)   SpO2 94%   BMI 38.25 kg/m   Patient alert and oriented and in no cardiopulmonary distress.  Chest: Clear to auscultation bilaterally.  CVS: S1, S2 no murmurs, no S3.Regular rate.  ABD: Soft non tender.   Ext: No edema  MS: Adequate ROM spine, shoulders, hips and knees.  Psych: Good eye contact, normal affect. Memory intact not anxious or depressed appearing.  CNS: CN 2-12 intact, power,  normal throughout.no focal deficits noted.   Assessment & Plan  HTN (hypertension) BP Readings from Last 3 Encounters:  10/05/21 136/82  09/20/21 (!) 146/91  09/01/21 (!) 142/98  Chronic condition well-controlled Continue olmesartan 20 mg daily DASH diet advised engage in regular moderate exercises at least 150 minutes weekly Patient encouraged to continue to monitor blood pressure at home and report if readings are greater than  140/90. Follow-up in 5 months  Obesity Wt Readings from Last 3 Encounters:  10/05/21 237 lb (107.5 kg)  09/20/21 230 lb (104.3 kg)  09/01/21 236 lb (107 kg)  Patient counseled on low-carb diet encouraged to engage in regular moderate exercises at least 150 minutes weekly. Follow-up in 5 months  Prediabetes Lab Results  Component Value Date   HGBA1C 6.0 (H) 10/03/2021  Stable chronic condition Avoid sugar sweets soda Engage in regular moderate  daily exercise  Need for varicella vaccine Patient educated on CDC recommendation for the shingles vaccine. Verbal consent was obtained from the patient, vaccine administered by nurse, no sign of adverse reactions noted at this time. Patient education on arm soreness and use of tylenol or ibuprofen (if safe) for this patient  was discussed. Patient educated on the signs and symptoms of adverse effect and advise to contact the office if they occur.   Need for Tdap vaccination Patient educated on CDC recommendation for the TDAP vaccine. Verbal consent was obtained from the patient, vaccine administered by nurse, no sign of adverse reactions noted at this time. Patient education on arm soreness and use of tylenol or ibuprofen (if safe) for this patient  was discussed. Patient educated on the signs and symptoms of adverse effect and advise to contact the office if they occur.

## 2021-11-15 IMAGING — MR MR CERVICAL SPINE W/O CM
5 series · 28 of 48 positions shown · non-contrast
Comparison: 06/28/2011

CLINICAL DATA: Neck and upper back pain for 1 year. Weakness in the
neck and bilateral shoulders

EXAM:
MRI CERVICAL SPINE WITHOUT CONTRAST
TECHNIQUE: Multiplanar, multisequence MR imaging of the cervical spine was
performed. No intravenous contrast was administered.

[Series 2: T2 · sagittal · 3.0mm · 0.66mm/px · 6 of 13 slices shown (1 of 2)]
[im 1/13]
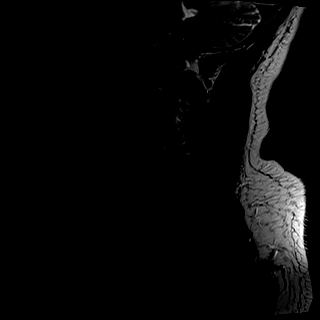
[im 3/13]
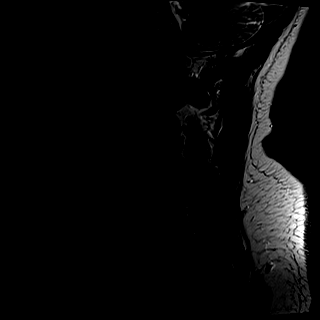
[im 5/13]
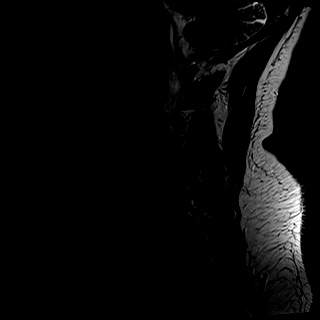
[im 8/13]
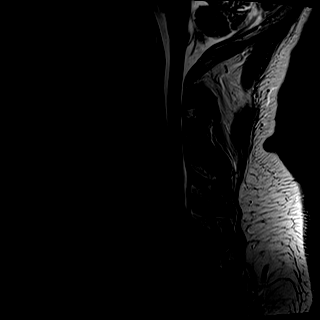
[im 10/13]
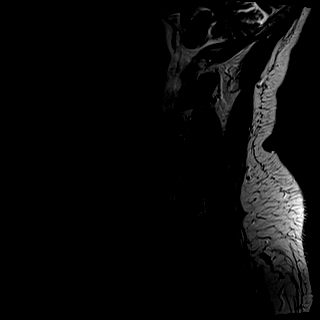
[im 13/13]
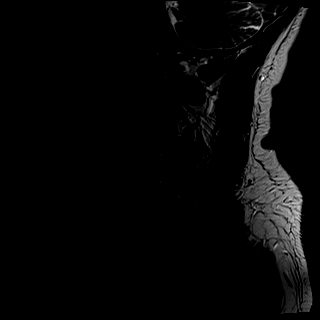

[Series 3: T1 · sagittal · 3.0mm · 0.41mm/px · 6 of 13 slices shown]
[im 1/13]
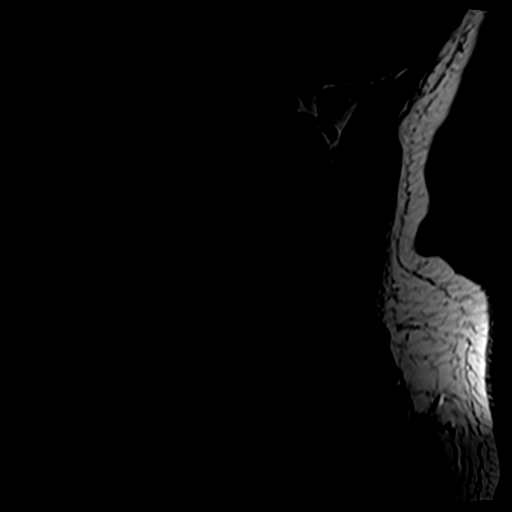
[im 3/13]
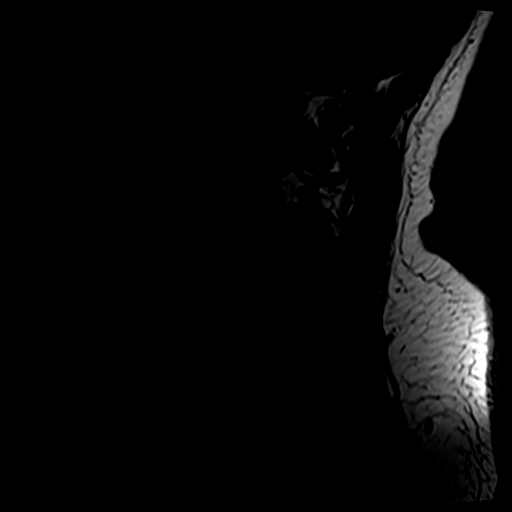
[im 5/13]
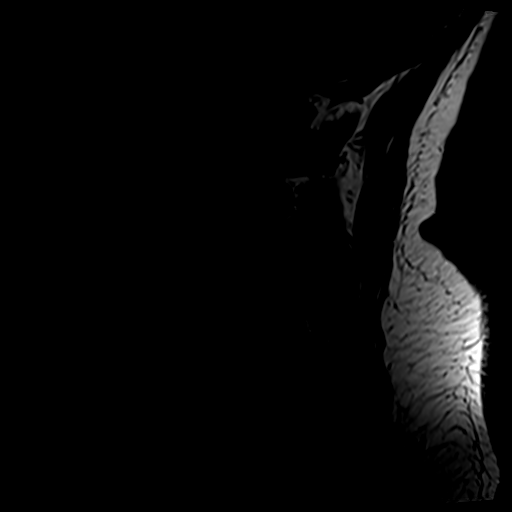
[im 8/13]
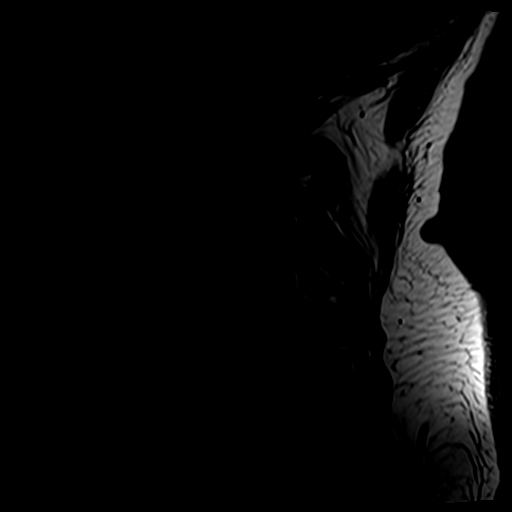
[im 10/13]
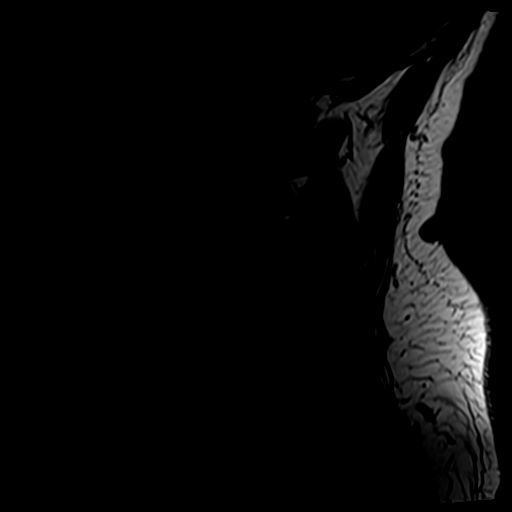
[im 13/13]
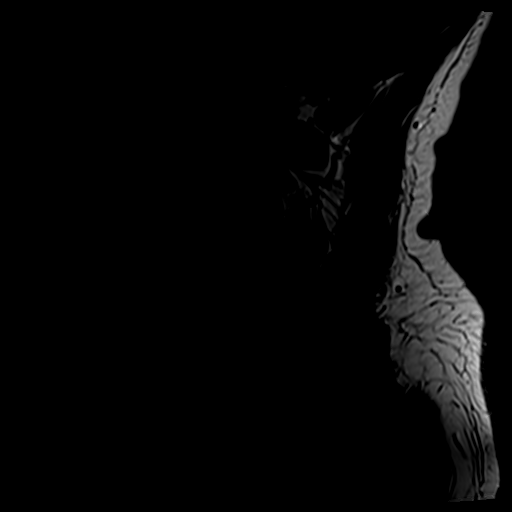

[Series 4: tir sag · sagittal · 3.0mm · 0.41mm/px · 6 of 13 slices shown]
[im 1/13]
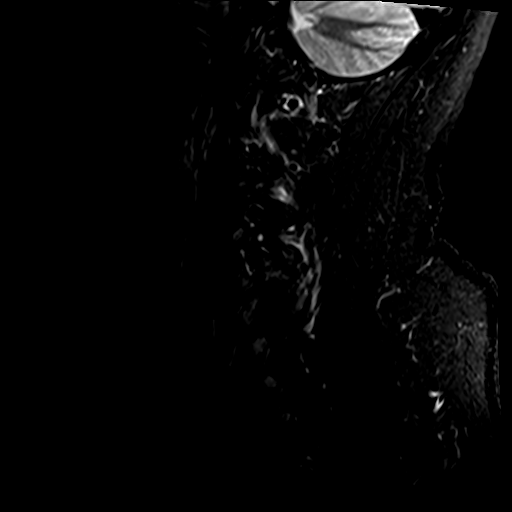
[im 3/13]
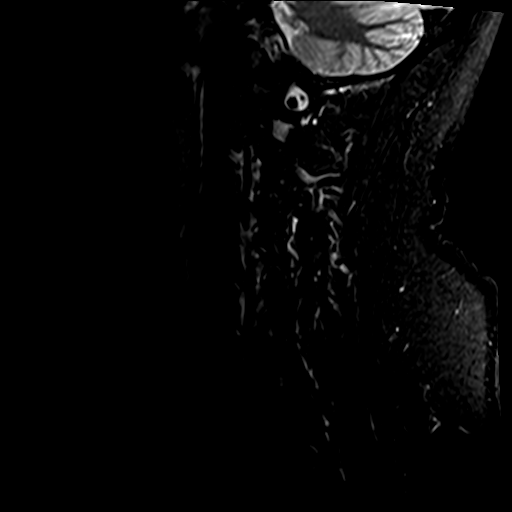
[im 5/13]
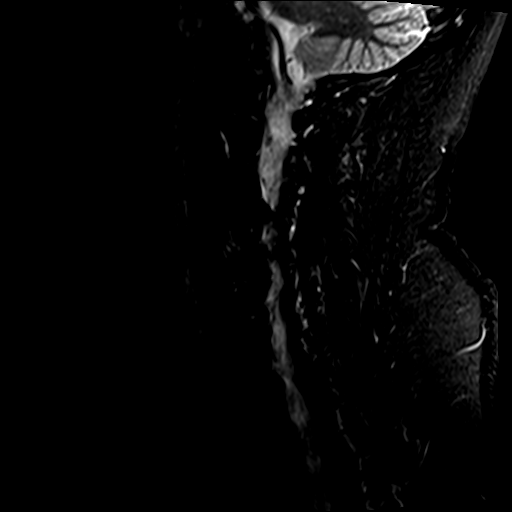
[im 8/13]
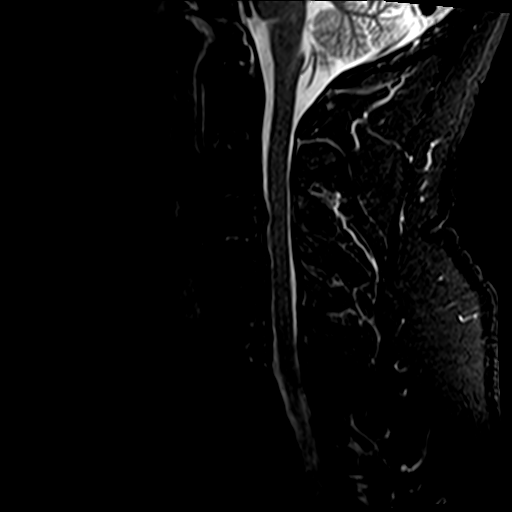
[im 10/13]
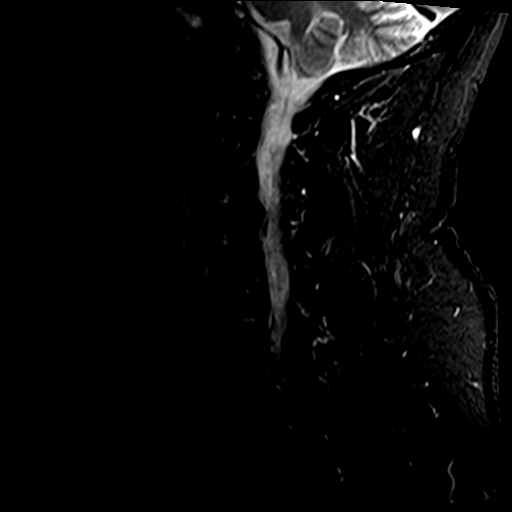
[im 13/13]
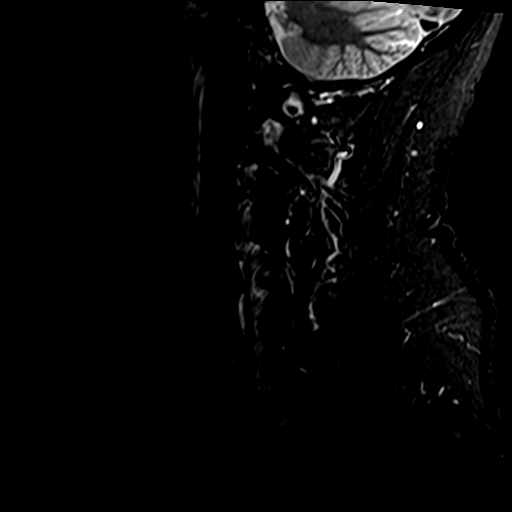

[Series 5: GRE · axial · 3.0mm · 0.35mm/px · 1 of 30 slices shown]
[im 1/30]
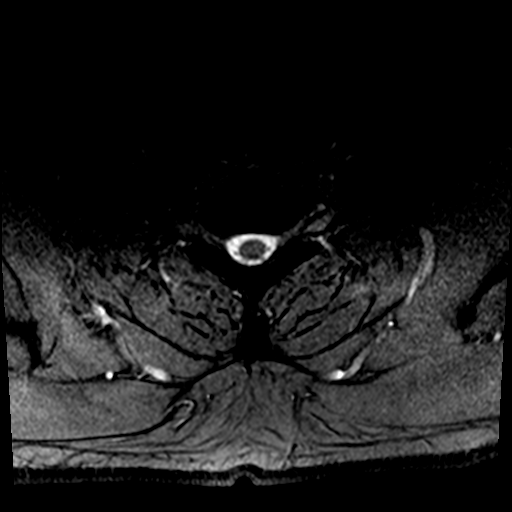

[Series 6: T2 · axial · 3.0mm · 0.70mm/px · z∈[-78,+27]mm · 9 of 30 slices shown (2 of 2)]
[im 1/30]
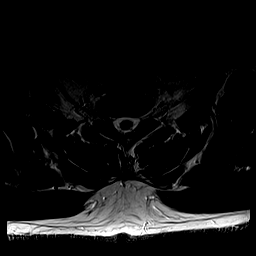
[im 5/30]
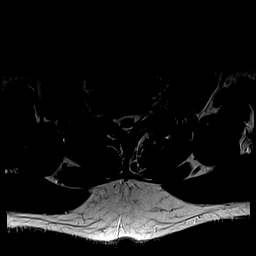
[im 9/30]
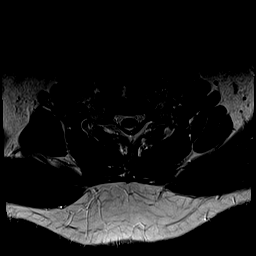
[im 13/30]
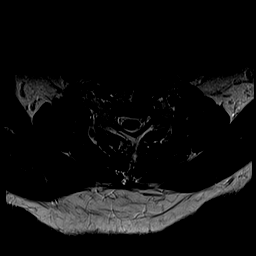
[im 15/30]
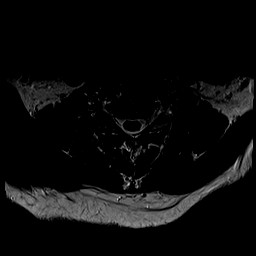
[im 17/30]
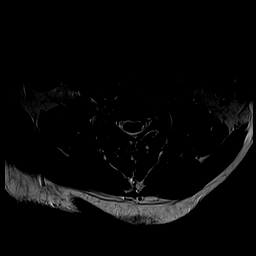
[im 21/30]
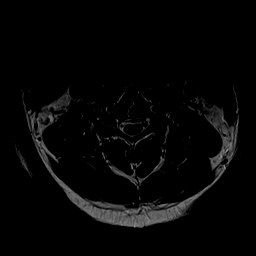
[im 25/30]
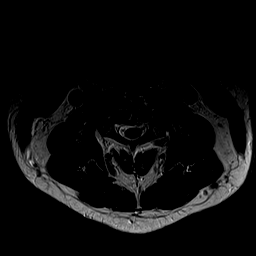
[im 30/30]
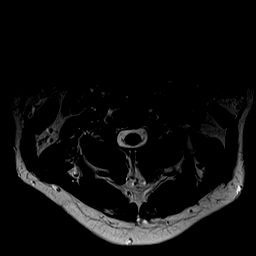

[28 of 48 positions shown; findings below may reference images not displayed]

FINDINGS: Alignment: Generalized straightening of the cervical spine.

Vertebrae: No fracture, evidence of discitis, or bone lesion. Solid
arthrodesis from C5-C7.

Cord: Normal signal and morphology.

Posterior Fossa, vertebral arteries, paraspinal tissues: Negative.

Disc levels:

C2-3: Mild left facet spurring.

C3-4: Disc narrowing with right more than left uncovertebral and
paracentral ridging. Chronic right paracentral protrusion. Right
more than left spinal stenosis with mild right ventral cord
flattening. Severe right and advanced left foraminal stenosis

C4-5: Disc narrowing and bulging with eccentric right uncovertebral
and facet spurring. Advanced right foraminal stenosis, new.

C5-6: ACDF with solid arthrodesis.  No impingement

C6-7: ACDF with solid arthrodesis. Notable residual uncovertebral
ridging with foraminal stenosis but visibly patent superior foramina
on both sides.

C7-T1:Right more than left uncovertebral spurring and bilateral
facet spurring. Moderate right foraminal narrowing with C8 root
flattening on sagittal images.
IMPRESSION: 1. C5-C7 ACDF with solid arthrodesis. There has been adjacent level
degeneration and stenosis since [DATE]. C3-4 advanced right more than left foraminal impingement. Spinal
stenosis that causes mild right ventral cord flattening.
3. C4-5 advanced right foraminal stenosis, new from 2070.
4. C7-T1 moderate right foraminal narrowing, new from 2070.

## 2021-12-13 ENCOUNTER — Encounter: Payer: Self-pay | Admitting: Orthopedic Surgery

## 2021-12-13 ENCOUNTER — Ambulatory Visit: Payer: Managed Care, Other (non HMO) | Admitting: Orthopedic Surgery

## 2021-12-13 DIAGNOSIS — M65321 Trigger finger, right index finger: Secondary | ICD-10-CM | POA: Diagnosis not present

## 2021-12-13 NOTE — Progress Notes (Signed)
Return patient Visit  Assessment: Troy Franklin is a 51 y.o. male with the following: There are no diagnoses linked to this encounter.  Plan: Troy Franklin has right index trigger finger.  Splinting has prevented the triggering, but he continues to have pain, and also notes stiffness.  He has noticed a nodule, just ulnar to the A1 pulley to the index finger.  At this point, he is interested in trying an injection.  This was completed in clinic today without issues.  He will follow-up as needed.  Procedure note injection - Right Index finger  A1 Pulley  Verbal consent was obtained to inject the Right Index finger  A1 pulley Timeout was completed to confirm the site of injection.  The skin was prepped with alcohol and ethyl chloride was sprayed at the injection site.  A 21-gauge needle was used to inject 40 mg of Depo-Medrol and 1% lidocaine (1 cc) into the Right Index finger  using a direct anterior approach.  There were no complications. Patient tolerated the procedure well. A sterile bandage was applied   Follow-up: Return if symptoms worsen or fail to improve.  Subjective:  Chief Complaint  Patient presents with   Hand Problem    Right index finger pain swollen/won't move in the morning     History of Present Illness: Troy Franklin is a 51 y.o. male who has returned to clinic for repeat evaluation of his right index finger.  I saw him in clinic a couple of months ago.  He was diagnosed with a trigger finger.  He elected against an injection at that time.  Since then, he has been wearing a splint at nighttime.  He notes some stiffness in his finger, but less triggering.  He also notes continued pain over the A1 pulley, as well as a small swelling just ulnar to the A1 pulley.  He is having a town next week for a hunting trip, going to [place].  He is interested in an injection today.  Review of Systems: No fevers or chills No numbness or tingling No chest pain No shortness of  breath No bowel or bladder dysfunction No GI distress No headaches     Objective: There were no vitals taken for this visit.  Physical Exam:  General: Alert and oriented. and No acute distress. Gait: Normal gait.  Evaluation of the right hand demonstrates no swelling.  No bruising.  No active triggering on physical exam today.  He does have tenderness to palpation of the A1 pulley to the index finger.  There is a small, palpable nodule just ulnar to the A1 pulley.  No additional tenderness throughout the right hand.  No evidence of atrophy.  No numbness or tingling.  2+ radial pulse.  IMAGING: No new imaging obtained today   New Medications:  No orders of the defined types were placed in this encounter.     Mordecai Rasmussen, MD  12/13/2021 11:41 AM

## 2021-12-13 NOTE — Patient Instructions (Signed)

## 2022-01-20 ENCOUNTER — Encounter: Payer: Managed Care, Other (non HMO) | Admitting: Internal Medicine

## 2022-01-27 ENCOUNTER — Ambulatory Visit (INDEPENDENT_AMBULATORY_CARE_PROVIDER_SITE_OTHER): Payer: Managed Care, Other (non HMO) | Admitting: Internal Medicine

## 2022-01-27 ENCOUNTER — Encounter: Payer: Self-pay | Admitting: Internal Medicine

## 2022-01-27 VITALS — BP 147/79 | HR 69 | Ht 67.0 in | Wt 238.0 lb

## 2022-01-27 DIAGNOSIS — Z0000 Encounter for general adult medical examination without abnormal findings: Secondary | ICD-10-CM | POA: Diagnosis not present

## 2022-01-27 DIAGNOSIS — Z23 Encounter for immunization: Secondary | ICD-10-CM | POA: Diagnosis not present

## 2022-01-27 DIAGNOSIS — Z0001 Encounter for general adult medical examination with abnormal findings: Secondary | ICD-10-CM | POA: Insufficient documentation

## 2022-01-27 NOTE — Assessment & Plan Note (Signed)
Presenting today for his annual physical exam.  Previous records and recent labs were reviewed. -Repeat labs have been ordered for early January -He will receive his second Shingrix vaccine today -Additional vaccines up-to-date -Olmesartan before leaving home this morning -We will follow up in 3 months to review labs and for HTN check

## 2022-01-27 NOTE — Progress Notes (Signed)
Complete physical exam  Patient: Troy Franklin   DOB: 1970-08-26   51 y.o. Male  MRN: [id number]  Subjective:    Chief Complaint  Patient presents with   Annual Exam    Troy Franklin is a 51 y.o. male who presents today for a complete physical exam. He reports consuming a general diet. The patient does not participate in regular exercise at present. He generally feels fairly well. He reports sleeping fairly well. He does not have additional problems to discuss today.    Most recent fall risk assessment:    01/27/2022    2:02 PM  [place] in the past year? 0  Number falls in past yr: 0  Injury with Fall? 0  Risk for fall due to : No Fall Risks  Follow up Falls evaluation completed     Most recent depression screenings:    01/27/2022    2:02 PM 10/05/2021    2:22 PM  PHQ 2/9 Scores  PHQ - 2 Score 0 0  PHQ- 9 Score 4     Vision:Within last year and Dental: No current dental problems and Receives regular dental care  Past Medical History:  Diagnosis Date   Arthritis    lumbar   Complication of anesthesia    difficulty waking up- sleepiness lasted longer than other times.    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   Hypertension    PONV (postoperative nausea and vomiting)    Sleep apnea    Cpap   Spondylolisthesis of lumbar region    Past Surgical History:  Procedure Laterality Date   ANKLE FRACTURE SURGERY     right - pins and screws   BACK SURGERY     fusion 2014, laminectomy 2015, laminectomy and fusion 2019, fusion 2021   BIOPSY  02/09/2021   Procedure: BIOPSY;  Surgeon: Harvel Quale, MD;  Location: [facility];  Service: Gastroenterology;;   CERVICAL FUSION  2013   CERVICAL FUSION     2007 & 2013 & March 2022   COLONOSCOPY WITH PROPOFOL N/A 02/09/2021   Procedure: COLONOSCOPY WITH PROPOFOL;  Surgeon: Harvel Quale, MD;  Location: [facility];  Service:  Gastroenterology;  Laterality: N/A;  7:30   HERNIA REPAIR  1974   hydrocele   KNEE LIGAMENT RECONSTRUCTION     right 1987   LUMBAR LAMINECTOMY/DECOMPRESSION MICRODISCECTOMY N/A 10/10/2017   Procedure: Lumbar Three-Four Laminectomy;  Surgeon: Ashok Pall, MD;  Location: [place];  Service: Neurosurgery;  Laterality: N/A;   POLYPECTOMY  02/09/2021   Procedure: POLYPECTOMY;  Surgeon: Harvel Quale, MD;  Location: [facility];  Service: Gastroenterology;;   VASECTOMY     Social History   Tobacco Use   Smoking status: Former    Packs/day: 0.25    Years: 4.00    Total pack years: 1.00    Types: Cigarettes    Quit date: 12/16/2018    Years since quitting: 3.1   Smokeless tobacco: Never  Vaping Use   Vaping Use: Never used  Substance Use Topics   Alcohol use: Not Currently    Comment: socially- x1/month   Drug use: No   Family History  Problem Relation Age of Onset   Colon cancer Neg Hx    Allergies  Allergen Reactions   Onion Other (See Comments)    Migraines    Patient Care Team: Johnette Abraham, MD as PCP -  General (Internal Medicine)   Outpatient Medications Prior to Visit  Medication Sig   acetaminophen (TYLENOL) 500 MG tablet Take 1,000 mg by mouth every 6 (six) hours as needed for moderate pain or mild pain.   Naproxen Sodium (ALEVE PO) Take by mouth. Once a day   olmesartan (BENICAR) 20 MG tablet Take 1 tablet (20 mg total) by mouth daily.   UNABLE TO FIND Med Name: AutoPAP device. Use nightly for obstructive sleep apnea.   No facility-administered medications prior to visit.   Review of Systems  Neurological:  Positive for headaches.  All other systems reviewed and are negative.     Objective:     BP (!) 147/79   Pulse 69   Ht _0  (1.702 m)   Wt 238 lb (108 kg)   SpO2 93%   BMI 37.28 kg/m  BP Readings from Last 3 Encounters:  01/27/22 (!) 147/79  10/05/21 136/82  09/20/21 (!) 146/91   Physical Exam Vitals reviewed.   Constitutional:      General: He is not in acute distress.    Appearance: Normal appearance. He is obese. He is not ill-appearing.  HENT:     Head: Normocephalic and atraumatic.     Right Ear: External ear normal.     Left Ear: External ear normal.     Nose: Nose normal. No congestion or rhinorrhea.     Mouth/Throat:     Mouth: Mucous membranes are moist.     Pharynx: Oropharynx is clear.  Eyes:     Extraocular Movements: Extraocular movements intact.     Conjunctiva/sclera: Conjunctivae normal.     Pupils: Pupils are equal, round, and reactive to light.  Cardiovascular:     Rate and Rhythm: Normal rate and regular rhythm.     Pulses: Normal pulses.     Heart sounds: Normal heart sounds. No murmur heard. Pulmonary:     Effort: Pulmonary effort is normal.     Breath sounds: Normal breath sounds. No wheezing, rhonchi or rales.  Abdominal:     General: Abdomen is flat. Bowel sounds are normal. There is no distension.     Palpations: Abdomen is soft.     Tenderness: There is no abdominal tenderness.  Musculoskeletal:        General: No swelling or deformity. Normal range of motion.     Cervical back: Normal range of motion.  Skin:    General: Skin is warm and dry.     Capillary Refill: Capillary refill takes less than 2 seconds.  Neurological:     General: No focal deficit present.     Mental Status: He is alert and oriented to person, place, and time.     Motor: No weakness.  Psychiatric:        Mood and Affect: Mood normal.        Behavior: Behavior normal.        Thought Content: Thought content normal.     Last CBC Lab Results  Component Value Date   WBC 5.1 03/02/2021   HGB 15.9 03/02/2021   HCT 46.3 03/02/2021   MCV 90 03/02/2021   MCH 30.9 03/02/2021   RDW 11.8 03/02/2021   PLT 150 83/72/9021   Last metabolic panel Lab Results  Component Value Date   GLUCOSE 110 (H) 10/03/2021   NA 139 10/03/2021   K 4.7 10/03/2021   CL 103 10/03/2021   CO2 23  10/03/2021   BUN 13 10/03/2021   CREATININE 0.94 10/03/2021  EGFR 97 10/03/2021   CALCIUM 9.2 10/03/2021   PROT 7.1 03/02/2021   ALBUMIN 4.9 03/02/2021   LABGLOB 2.2 03/02/2021   AGRATIO 2.2 03/02/2021   BILITOT 1.3 (H) 03/02/2021   ALKPHOS 79 03/02/2021   AST 23 03/02/2021   ALT 31 03/02/2021   ANIONGAP 7 02/07/2021   Last lipids Lab Results  Component Value Date   CHOL 131 03/02/2021   HDL 39 (L) 03/02/2021   LDLCALC 74 03/02/2021   TRIG 93 03/02/2021   Last hemoglobin A1c Lab Results  Component Value Date   HGBA1C 6.0 (H) 10/03/2021   Last thyroid functions Lab Results  Component Value Date   TSH 2.200 08/27/2020       Assessment & Plan:    Routine Health Maintenance and Physical Exam  Immunization History  Administered Date(s) Administered   PFIZER(Purple Top)SARS-COV-2 Vaccination 06/19/2019, 07/11/2019   Tdap 10/05/2021   Zoster Recombinat (Shingrix) 10/05/2021    Health Maintenance  Topic Date Due   Zoster Vaccines- Shingrix (2 of 2) 11/30/2021   INFLUENZA VACCINE  06/11/2022 (Originally 10/11/2021)   COLONOSCOPY (Pts 45-74yr Insurance coverage will need to be confirmed)  02/10/2024   Hepatitis C Screening  Completed   HIV Screening  Completed   HPV VACCINES  Aged Out   COVID-19 Vaccine  Discontinued    Discussed health benefits of physical activity, and encouraged him to engage in regular exercise appropriate for his age and condition.  Problem List Items Addressed This Visit       Encounter for well adult exam with abnormal findings    Presenting today for his annual physical exam.  Previous records and recent labs were reviewed. -Repeat labs have been ordered for early January -He will receive his second Shingrix vaccine today -Additional vaccines up-to-date -Olmesartan before leaving home this morning -We will follow up in 3 months to review labs and for HTN check       Return in about 3 months (around 04/29/2022).   PJohnette Abraham MD

## 2022-01-27 NOTE — Patient Instructions (Signed)
It was a pleasure to see you today.  Thank you for giving [place] the opportunity to be involved in your care.  Below is a brief recap of your visit and next steps.  We will plan to see you again in 3 months.  Summary You completed your annual exam today I have ordered labs for early January You will receive your second shingles vaccines today Follow up in 3 months

## 2022-02-17 ENCOUNTER — Encounter: Payer: Managed Care, Other (non HMO) | Admitting: Internal Medicine

## 2022-03-02 ENCOUNTER — Encounter: Payer: Managed Care, Other (non HMO) | Admitting: Nurse Practitioner

## 2022-03-29 HISTORY — PX: CATARACT EXTRACTION: SUR2

## 2022-04-06 LAB — CMP14+EGFR
ALT: 39 IU/L (ref 0–44)
AST: 27 IU/L (ref 0–40)
Albumin/Globulin Ratio: 1.9 (ref 1.2–2.2)
Albumin: 4.2 g/dL (ref 3.8–4.9)
Alkaline Phosphatase: 72 IU/L (ref 44–121)
BUN/Creatinine Ratio: 13 (ref 9–20)
BUN: 15 mg/dL (ref 6–24)
Bilirubin Total: 0.9 mg/dL (ref 0.0–1.2)
CO2: 23 mmol/L (ref 20–29)
Calcium: 9.5 mg/dL (ref 8.7–10.2)
Chloride: 105 mmol/L (ref 96–106)
Creatinine, Ser: 1.15 mg/dL (ref 0.76–1.27)
Globulin, Total: 2.2 g/dL (ref 1.5–4.5)
Glucose: 106 mg/dL — ABNORMAL HIGH (ref 70–99)
Potassium: 4.6 mmol/L (ref 3.5–5.2)
Sodium: 141 mmol/L (ref 134–144)
Total Protein: 6.4 g/dL (ref 6.0–8.5)
eGFR: 76 mL/min/{1.73_m2} (ref >59)

## 2022-04-06 LAB — CBC WITH DIFFERENTIAL/PLATELET
Basophils Absolute: 0.1 10*3/uL (ref 0.0–0.2)
Basos: 1 %
EOS (ABSOLUTE): 0.1 10*3/uL (ref 0.0–0.4)
Eos: 3 %
Hematocrit: 41.2 % (ref 37.5–51.0)
Hemoglobin: 14.1 g/dL (ref 13.0–17.7)
Immature Grans (Abs): 0 10*3/uL (ref 0.0–0.1)
Immature Granulocytes: 0 %
Lymphocytes Absolute: 1.8 10*3/uL (ref 0.7–3.1)
Lymphs: 37 %
MCH: 31.3 pg (ref 26.6–33.0)
MCHC: 34.2 g/dL (ref 31.5–35.7)
MCV: 91 fL (ref 79–97)
Monocytes Absolute: 0.5 10*3/uL (ref 0.1–0.9)
Monocytes: 10 %
Neutrophils Absolute: 2.4 10*3/uL (ref 1.4–7.0)
Neutrophils: 49 %
Platelets: 150 10*3/uL (ref 150–450)
RBC: 4.51 x10E6/uL (ref 4.14–5.80)
RDW: 11.9 % (ref 11.6–15.4)
WBC: 5 10*3/uL (ref 3.4–10.8)

## 2022-04-06 LAB — PSA: Prostate Specific Ag, Serum: 1.7 ng/mL (ref 0.0–4.0)

## 2022-04-06 LAB — LIPID PANEL
Chol/HDL Ratio: 4.4 ratio (ref 0.0–5.0)
Cholesterol, Total: 168 mg/dL (ref 100–199)
HDL: 38 mg/dL — ABNORMAL LOW (ref >39)
LDL Chol Calc (NIH): 110 mg/dL — ABNORMAL HIGH (ref 0–99)
Triglycerides: 110 mg/dL (ref 0–149)
VLDL Cholesterol Cal: 20 mg/dL (ref 5–40)

## 2022-04-06 LAB — VITAMIN D 25 HYDROXY (VIT D DEFICIENCY, FRACTURES): Vit D, 25-Hydroxy: 25 ng/mL — ABNORMAL LOW (ref 30.0–100.0)

## 2022-04-06 LAB — TSH: TSH: 1.62 u[IU]/mL (ref 0.450–4.500)

## 2022-04-11 ENCOUNTER — Encounter: Payer: Self-pay | Admitting: Internal Medicine

## 2022-04-11 ENCOUNTER — Ambulatory Visit: Payer: Managed Care, Other (non HMO) | Admitting: Internal Medicine

## 2022-04-11 VITALS — BP 127/75 | HR 73 | Ht 67.0 in | Wt 239.6 lb

## 2022-04-11 DIAGNOSIS — L989 Disorder of the skin and subcutaneous tissue, unspecified: Secondary | ICD-10-CM

## 2022-04-11 DIAGNOSIS — Z2821 Immunization not carried out because of patient refusal: Secondary | ICD-10-CM | POA: Diagnosis not present

## 2022-04-11 DIAGNOSIS — E559 Vitamin D deficiency, unspecified: Secondary | ICD-10-CM

## 2022-04-11 DIAGNOSIS — J029 Acute pharyngitis, unspecified: Secondary | ICD-10-CM | POA: Diagnosis not present

## 2022-04-11 DIAGNOSIS — E782 Mixed hyperlipidemia: Secondary | ICD-10-CM

## 2022-04-11 NOTE — Progress Notes (Signed)
Established Patient Office Visit  Subjective   Patient ID: Troy Franklin, male    DOB: 1970-09-22  Age: 52 y.o. MRN: [id number]  Chief Complaint  Patient presents with   Hypertension    Follow up   [name] returns to care today.  He was last evaluated by me on 01/27/22 for his annual exam.  There have been no acute interval events since last appointment.  Today [name] reports feeling well.  His acute concern is a sore on the left side of his throat that has been present for the last 2 weeks.  It is not getting worse, but is also not improved.  He also has 2 skin lesions on his arm that he would like for me to evaluate.  Past Medical History:  Diagnosis Date   Arthritis    lumbar   Complication of anesthesia    difficulty waking up- sleepiness lasted longer than other times.    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   Hypertension    PONV (postoperative nausea and vomiting)    Sleep apnea    Cpap   Spondylolisthesis of lumbar region    Past Surgical History:  Procedure Laterality Date   ANKLE FRACTURE SURGERY     right - pins and screws   BACK SURGERY     fusion 2014, laminectomy 2015, laminectomy and fusion 2019, fusion 2021   BIOPSY  02/09/2021   Procedure: BIOPSY;  Surgeon: Harvel Quale, MD;  [facility]: [facility];  Service: Gastroenterology;;   CATARACT EXTRACTION Right 03/29/2022   CERVICAL FUSION  2013   CERVICAL FUSION     2007 & 2013 & March 2022   COLONOSCOPY WITH PROPOFOL N/A 02/09/2021   Procedure: COLONOSCOPY WITH PROPOFOL;  Surgeon: Harvel Quale, MD;  [facility];  Service: Gastroenterology;  Laterality: N/A;  7:30   HERNIA REPAIR  1974   hydrocele   KNEE LIGAMENT RECONSTRUCTION     right 1987   LUMBAR LAMINECTOMY/DECOMPRESSION MICRODISCECTOMY N/A 10/10/2017   Procedure: Lumbar Three-Four Laminectomy;  Surgeon: Ashok Pall, MD;  Location: [place];  Service:  Neurosurgery;  Laterality: N/A;   POLYPECTOMY  02/09/2021   Procedure: POLYPECTOMY;  Surgeon: Harvel Quale, MD;  [facility];  Service: Gastroenterology;;   VASECTOMY     Social History   Tobacco Use   Smoking status: Former    Packs/day: 0.25    Years: 4.00    Total pack years: 1.00    Types: Cigarettes    Quit date: 12/16/2018    Years since quitting: 3.3   Smokeless tobacco: Never  Vaping Use   Vaping Use: Never used  Substance Use Topics   Alcohol use: Not Currently    Comment: socially- x1/month   Drug use: No   Family History  Problem Relation Age of Onset   Colon cancer Neg Hx    Allergies  Allergen Reactions   Onion Other (See Comments)    Migraines    Review of Systems  HENT:  Positive for sore throat (L side of throat).   Skin:        Skin lesions on arms  All other systems reviewed and are negative.    Objective:     BP 127/75   Pulse 73   Ht '5\' 7"'$  (1.702 m)   Wt 239 lb 9.6 oz (108.7 kg)   SpO2 93%   BMI 37.53 kg/m  BP Readings from Last 3 Encounters:  04/11/22 127/75  01/27/22 (!) 147/79  10/05/21 136/82   Physical Exam Vitals reviewed.  Constitutional:      General: He is not in acute distress.    Appearance: Normal appearance. He is obese. He is not ill-appearing.  HENT:     Head: Normocephalic and atraumatic.     Right Ear: External ear normal.     Left Ear: External ear normal.     Nose: Nose normal. No congestion or rhinorrhea.     Mouth/Throat:     Mouth: Mucous membranes are moist.     Pharynx: Oropharynx is clear. Posterior oropharyngeal erythema present. No oropharyngeal exudate.  Eyes:     Extraocular Movements: Extraocular movements intact.     Conjunctiva/sclera: Conjunctivae normal.     Pupils: Pupils are equal, round, and reactive to light.  Cardiovascular:     Rate and Rhythm: Normal rate and regular rhythm.     Pulses: Normal pulses.     Heart sounds: Normal heart sounds. No murmur  heard. Pulmonary:     Effort: Pulmonary effort is normal.     Breath sounds: Normal breath sounds. No wheezing, rhonchi or rales.  Abdominal:     General: Abdomen is flat. Bowel sounds are normal. There is no distension.     Palpations: Abdomen is soft.     Tenderness: There is no abdominal tenderness.  Musculoskeletal:        General: No swelling or deformity. Normal range of motion.     Cervical back: Normal range of motion.  Skin:    General: Skin is warm and dry.     Capillary Refill: Capillary refill takes less than 2 seconds.     Findings: Lesion (2 macules noted on the L arm) present.  Neurological:     General: No focal deficit present.     Mental Status: He is alert and oriented to person, place, and time.     Motor: No weakness.  Psychiatric:        Mood and Affect: Mood normal.        Behavior: Behavior normal.        Thought Content: Thought content normal.   Last CBC Lab Results  Component Value Date   WBC 5.0 04/05/2022   HGB 14.1 04/05/2022   HCT 41.2 04/05/2022   MCV 91 04/05/2022   MCH 31.3 04/05/2022   RDW 11.9 04/05/2022   PLT 150 27/08/2374   Last metabolic panel Lab Results  Component Value Date   GLUCOSE 106 (H) 04/05/2022   NA 141 04/05/2022   K 4.6 04/05/2022   CL 105 04/05/2022   CO2 23 04/05/2022   BUN 15 04/05/2022   CREATININE 1.15 04/05/2022   EGFR 76 04/05/2022   CALCIUM 9.5 04/05/2022   PROT 6.4 04/05/2022   ALBUMIN 4.2 04/05/2022   LABGLOB 2.2 04/05/2022   AGRATIO 1.9 04/05/2022   BILITOT 0.9 04/05/2022   ALKPHOS 72 04/05/2022   AST 27 04/05/2022   ALT 39 04/05/2022   ANIONGAP 7 02/07/2021   Last lipids Lab Results  Component Value Date   CHOL 168 04/05/2022   HDL 38 (L) 04/05/2022   LDLCALC 110 (H) 04/05/2022   TRIG 110 04/05/2022   CHOLHDL 4.4 04/05/2022   Last hemoglobin A1c Lab Results  Component Value Date   HGBA1C 6.0 (H) 10/03/2021   Last thyroid functions Lab Results  Component Value Date   TSH 1.620  04/05/2022   Last vitamin  D Lab Results  Component Value Date   VD25OH 25.0 (L) 04/05/2022   The 10-year ASCVD risk score (Arnett DK, et al., 2019) is: 5.6%    Assessment & Plan:   Problem List Items Addressed This Visit       Skin lesions    2 macules were noted on the left extremity.  At his request have placed referral to dermatology for routine skin examination.      Hyperlipidemia    Lipid panel updated last week.  Total cholesterol 168 and LDL 110.  His 10-year ASCVD risk score today is 5.6%. -Recommend lifestyle modifications aimed at weight loss and lowering his LDL.  He was provided with information regarding the Mediterranean diet.      Sore throat    His acute concern today is left-sided throat irritation that has been present for the last 2 to 3 weeks.  He is able to eat and drink without significant pain.  His exam today reveals pharyngeal erythema without exudate. -I have recommended daily salt water rinses for the next week.  He was instructed to return to care if his symptoms do not improve over that time.      Vitamin D insufficiency    Noted on labs from last week.  I have recommended daily vitamin D supplementation, 2000 IU.      Return in about 6 months (around 10/10/2022) for CPE.   Johnette Abraham, MD

## 2022-04-11 NOTE — Patient Instructions (Signed)
It was a pleasure to see you today.  Thank you for giving [place] the opportunity to be involved in your care.  Below is a brief recap of your visit and next steps.  We will plan to see you again in 6 months.  Summary I recommend starting daily vitamin D supplementatio, 2000 international units I have placed a referral to dermatology We will plan for follow up in 6 months Please see the attached information on the [facility]

## 2022-04-13 HISTORY — PX: CATARACT EXTRACTION: SUR2

## 2022-04-17 DIAGNOSIS — J029 Acute pharyngitis, unspecified: Secondary | ICD-10-CM | POA: Insufficient documentation

## 2022-04-17 DIAGNOSIS — E559 Vitamin D deficiency, unspecified: Secondary | ICD-10-CM | POA: Insufficient documentation

## 2022-04-17 DIAGNOSIS — L989 Disorder of the skin and subcutaneous tissue, unspecified: Secondary | ICD-10-CM | POA: Insufficient documentation

## 2022-04-17 NOTE — Assessment & Plan Note (Signed)
His acute concern today is left-sided throat irritation that has been present for the last 2 to 3 weeks.  He is able to eat and drink without significant pain.  His exam today reveals pharyngeal erythema without exudate. -I have recommended daily salt water rinses for the next week.  He was instructed to return to care if his symptoms do not improve over that time.

## 2022-04-17 NOTE — Assessment & Plan Note (Signed)
2 macules were noted on the left extremity.  At his request have placed referral to dermatology for routine skin examination.

## 2022-04-17 NOTE — Assessment & Plan Note (Signed)
Noted on labs from last week.  I have recommended daily vitamin D supplementation, 2000 IU.

## 2022-04-17 NOTE — Assessment & Plan Note (Signed)
He is currently prescribed olmesartan 20 mg daily for treatment of hypertension.  His blood pressure today was 145/69 initially and improved to 127/75 on repeat. -No medication changes today.  Continue current regimen.

## 2022-04-17 NOTE — Assessment & Plan Note (Signed)
Lipid panel updated last week.  Total cholesterol 168 and LDL 110.  His 10-year ASCVD risk score today is 5.6%. -Recommend lifestyle modifications aimed at weight loss and lowering his LDL.  He was provided with information regarding the Mediterranean diet.

## 2022-05-11 ENCOUNTER — Encounter: Payer: Self-pay | Admitting: Radiology

## 2022-10-04 ENCOUNTER — Other Ambulatory Visit: Payer: Self-pay | Admitting: Nurse Practitioner

## 2022-10-04 DIAGNOSIS — I10 Essential (primary) hypertension: Secondary | ICD-10-CM

## 2022-10-17 ENCOUNTER — Ambulatory Visit: Payer: Managed Care, Other (non HMO) | Admitting: Internal Medicine

## 2022-10-17 ENCOUNTER — Encounter: Payer: Self-pay | Admitting: Internal Medicine

## 2022-10-17 VITALS — BP 117/74 | HR 66 | Ht 67.0 in | Wt 240.0 lb

## 2022-10-17 DIAGNOSIS — R7303 Prediabetes: Secondary | ICD-10-CM

## 2022-10-17 DIAGNOSIS — H9193 Unspecified hearing loss, bilateral: Secondary | ICD-10-CM

## 2022-10-17 DIAGNOSIS — E669 Obesity, unspecified: Secondary | ICD-10-CM

## 2022-10-17 DIAGNOSIS — Z6836 Body mass index (BMI) 36.0-36.9, adult: Secondary | ICD-10-CM

## 2022-10-17 LAB — POCT GLYCOSYLATED HEMOGLOBIN (HGB A1C): Hemoglobin A1C: 5.9 % — AB (ref 4.0–5.6)

## 2022-10-17 NOTE — Progress Notes (Signed)
Established Patient Office Visit  Subjective   Patient ID: Troy Franklin, male    DOB: December 08, 1970  Age: 52 y.o. MRN: [id number]  Chief Complaint  Patient presents with   Hypertension    Follow up   Troy Franklin returns to care today for routine follow-up.  He was last evaluated by me on 1/30.  Dermatology referral placed at that time.  No additional medication changes were made and a 52-month follow-up was arranged.  There have been no acute interval events.  Troy Franklin has multiple concerns that he would like to discuss today.  He expresses an interest in losing weight and is interested in meeting with a dietitian.  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  He is potentially interested in starting medication for weight loss as well. Troy Franklin is also concerned that he has hearing loss.  This was recently noticed when attending a conference and he caught himself straining to hear others on multiple occasions.  His wife has also made comments to him on multiple occasions about his hearing.  Past Medical History:  Diagnosis Date   Arthritis    lumbar   Complication of anesthesia    difficulty waking up- sleepiness lasted longer than other times.    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   Hypertension    PONV (postoperative nausea and vomiting)    Sleep apnea    Cpap   Spondylolisthesis of lumbar region    Past Surgical History:  Procedure Laterality Date   ANKLE FRACTURE SURGERY     right - pins and screws   BACK SURGERY     fusion 2014, laminectomy 2015, laminectomy and fusion 2019, fusion 2021   BIOPSY  02/09/2021   Procedure: BIOPSY;  Surgeon: Dolores Frame, MD;  Location: [facility];  Service: Gastroenterology;;   CATARACT EXTRACTION Right 03/29/2022   CATARACT EXTRACTION Left 04/2022   CERVICAL FUSION  2013   CERVICAL FUSION      2007 & 2013 & March 2022   COLONOSCOPY WITH PROPOFOL N/A 02/09/2021   Procedure: COLONOSCOPY WITH PROPOFOL;  Surgeon: Dolores Frame, MD;  Location: [facility];  Service: Gastroenterology;  Laterality: N/A;  7:30   HERNIA REPAIR  1974   hydrocele   KNEE LIGAMENT RECONSTRUCTION     right 1987   LUMBAR LAMINECTOMY/DECOMPRESSION MICRODISCECTOMY N/A 10/10/2017   Procedure: Lumbar Three-Four Laminectomy;  Surgeon: Coletta Memos, MD;  Location: [facility] OR;  Service: Neurosurgery;  Laterality: N/A;   POLYPECTOMY  02/09/2021   Procedure: POLYPECTOMY;  Surgeon: Marguerita Merles, Reuel Boom, MD;  Location: [facility];  Service: Gastroenterology;;   VASECTOMY     Social History   Tobacco Use   Smoking status: Former    Current packs/day: 0.00    Average packs/day: 0.3 packs/day for 4.0 years (1.0 ttl pk-yrs)    Types: Cigarettes    Start date: 12/16/2014    Quit date: 12/16/2018    Years since quitting: 3.8   Smokeless tobacco: Never  Vaping Use   Vaping status: Never Used  Substance Use Topics   Alcohol use: Not Currently    Comment: socially- x1/month   Drug use: No   Family History  Problem Relation Age of Onset   Colon cancer Neg Hx    Allergies  Allergen Reactions   Onion Other (See Comments)  Migraines    Review of Systems  HENT:  Positive for hearing loss.   All other systems reviewed and are negative.    Objective:     BP 117/74   Pulse 66   Ht 5\' 7"  (1.702 m)   Wt 240 lb (108.9 kg)   SpO2 94%   BMI 37.59 kg/m  BP Readings from Last 3 Encounters:  10/17/22 117/74  04/11/22 127/75  01/27/22 (!) 147/79   Physical Exam Vitals reviewed.  Constitutional:      General: He is not in acute distress.    Appearance: Normal appearance. He is obese. He is not ill-appearing.  HENT:     Head: Normocephalic and atraumatic.     Right Ear: Tympanic membrane, ear canal and external ear normal.     Left Ear: Tympanic membrane, ear canal and external ear  normal.     Nose: Nose normal. No congestion or rhinorrhea.     Mouth/Throat:     Mouth: Mucous membranes are moist.     Pharynx: Oropharynx is clear.  Eyes:     General: No scleral icterus.    Extraocular Movements: Extraocular movements intact.     Conjunctiva/sclera: Conjunctivae normal.     Pupils: Pupils are equal, round, and reactive to light.  Cardiovascular:     Rate and Rhythm: Normal rate and regular rhythm.     Pulses: Normal pulses.     Heart sounds: Normal heart sounds. No murmur heard. Pulmonary:     Effort: Pulmonary effort is normal.     Breath sounds: Normal breath sounds. No wheezing, rhonchi or rales.  Abdominal:     General: Abdomen is flat. Bowel sounds are normal. There is no distension.     Palpations: Abdomen is soft.     Tenderness: There is no abdominal tenderness.  Musculoskeletal:        General: No swelling or deformity. Normal range of motion.     Cervical back: Normal range of motion.  Skin:    General: Skin is warm and dry.     Capillary Refill: Capillary refill takes less than 2 seconds.  Neurological:     General: No focal deficit present.     Mental Status: He is alert and oriented to person, place, and time.     Motor: No weakness.  Psychiatric:        Mood and Affect: Mood normal.        Behavior: Behavior normal.        Thought Content: Thought content normal.   Last CBC Lab Results  Component Value Date   WBC 5.0 04/05/2022   HGB 14.1 04/05/2022   HCT 41.2 04/05/2022   MCV 91 04/05/2022   MCH 31.3 04/05/2022   RDW 11.9 04/05/2022   PLT 150 04/05/2022   Last metabolic panel Lab Results  Component Value Date   GLUCOSE 106 (H) 04/05/2022   NA 141 04/05/2022   K 4.6 04/05/2022   CL 105 04/05/2022   CO2 23 04/05/2022   BUN 15 04/05/2022   CREATININE 1.15 04/05/2022   EGFR 76 04/05/2022   CALCIUM 9.5 04/05/2022   PROT 6.4 04/05/2022   ALBUMIN 4.2 04/05/2022   LABGLOB 2.2 04/05/2022   AGRATIO 1.9 04/05/2022   BILITOT 0.9  04/05/2022   ALKPHOS 72 04/05/2022   AST 27 04/05/2022   ALT 39 04/05/2022   ANIONGAP 7 02/07/2021   Last lipids Lab Results  Component Value Date   CHOL 168 04/05/2022  HDL 38 (L) 04/05/2022   LDLCALC 110 (H) 04/05/2022   TRIG 110 04/05/2022   CHOLHDL 4.4 04/05/2022   Last hemoglobin A1c Lab Results  Component Value Date   HGBA1C 5.9 (A) 10/17/2022   Last thyroid functions Lab Results  Component Value Date   TSH 1.620 04/05/2022   Last vitamin D Lab Results  Component Value Date   VD25OH 25.0 (L) 04/05/2022   The 10-year ASCVD risk score (Arnett DK, et al., 2019) is: 5.3%    Assessment & Plan:   Problem List Items Addressed This Visit       Bilateral hearing loss - Primary    His acute concern today is hearing loss.  His wife has told him on multiple occasions that he is hearing is impaired.  This was recently noticed by him while attending a conference. -Audiology referral placed today for hearing assessment      Obesity    BMI 37.5.  He expresses an interest in weight loss.  His current schedule limits his ability to exercise, however he would like to make dietary changes. -Through shared decision making, a referral to medical nutrition therapy has been placed today -He has also expressed an interest in starting medication for weight loss at a future date.  I have requested that he contact his insurance company to see if [place] or Zepbound are covered.      Prediabetes    Previous documented history of prediabetes.  A1c 6.0 in July 2023.  POC A1c today is 5.9.  He will continue to focus on dietary changes aimed at lowering his blood sugar and weight loss.      Return in about 15 weeks (around 01/30/2023).   Billie Lade, MD

## 2022-10-17 NOTE — Patient Instructions (Signed)
It was a pleasure to see you today.  Thank you for giving [place] the opportunity to be involved in your care.  Below is a brief recap of your visit and next steps.  We will plan to see you again in November.  Summary No medication changes today Referrals placed for audiology and medical nutrition therapy I recommend seeing if your insurance company will cover Wegovy or Zepbound given diagnoses of prediabetes and obesity Follow up in 3 months

## 2022-10-17 NOTE — Assessment & Plan Note (Signed)
Previous documented history of prediabetes.  A1c 6.0 in July 2023.  POC A1c today is 5.9.  He will continue to focus on dietary changes aimed at lowering his blood sugar and weight loss.

## 2022-10-17 NOTE — Assessment & Plan Note (Signed)
His acute concern today is hearing loss.  His wife has told him on multiple occasions that he is hearing is impaired.  This was recently noticed by him while attending a conference. -Audiology referral placed today for hearing assessment

## 2022-10-17 NOTE — Assessment & Plan Note (Signed)
BMI 37.5.  He expresses an interest in weight loss.  His current schedule limits his ability to exercise, however he would like to make dietary changes. -Through shared decision making, a referral to medical nutrition therapy has been placed today -He has also expressed an interest in starting medication for weight loss at a future date.  I have requested that he contact his insurance company to see if [place] or Zepbound are covered.

## 2022-11-07 ENCOUNTER — Ambulatory Visit: Payer: Managed Care, Other (non HMO) | Admitting: Audiology

## 2022-12-11 ENCOUNTER — Encounter: Payer: Self-pay | Admitting: Internal Medicine

## 2022-12-11 ENCOUNTER — Telehealth: Payer: Self-pay | Admitting: Pulmonary Disease

## 2022-12-11 NOTE — Telephone Encounter (Signed)
Hasn't been seen in office for 2 years.  Needs an ROV first.

## 2022-12-11 NOTE — Telephone Encounter (Signed)
I haven't seen him since 2022.  He needs an ROV first.

## 2022-12-11 NOTE — Telephone Encounter (Signed)
Is okay to send a rx patient hasn't seen you since 2022 ,please advise

## 2022-12-11 NOTE — Telephone Encounter (Signed)
Patient states he saw Dr. Craige Cotta several months ago and he recommended a new CPAP machine.  Patient decided not to get the new machine at that time and now the motor on his current machine is giving out.  Can Dr.Sood write the prescription for the new machine he recommended in the past or does he need to be seen again---Please call [phone]

## 2022-12-15 ENCOUNTER — Ambulatory Visit: Payer: Managed Care, Other (non HMO) | Admitting: Primary Care

## 2022-12-15 VITALS — BP 142/76 | HR 67 | Ht 67.0 in | Wt 241.0 lb

## 2022-12-15 DIAGNOSIS — G4733 Obstructive sleep apnea (adult) (pediatric): Secondary | ICD-10-CM

## 2022-12-15 NOTE — Assessment & Plan Note (Signed)
-   PSG in November 2015 showed severe OSA, AHI 36/hour - Maintained on CPAP. He is 100% compliant with use and reports benefit from wearing  - Current pressure 12cm h20 - Needs to change DME company from [place] to [facility] due to insurance coverage - Received error message on CPAP machine that motor life had been exceeded, order placed for patient to received NEW CPAP machine at current pressure settings - FU in 2-3 months for compliance check

## 2022-12-15 NOTE — Patient Instructions (Addendum)
Orders: New CPAP 12cm h20 ([facility])  Call them Monday-Tuesday to make sure they received order/may need to bring machine in for compliance check   Follow-up 3 months with Waynetta Sandy NP (can be virtual on Friday afternoon)

## 2022-12-15 NOTE — Telephone Encounter (Signed)
Patient saw EW in office this morning.

## 2022-12-15 NOTE — Progress Notes (Signed)
@Patient  ID: Troy Franklin, male    DOB: 08-19-1970, 52 y.o.   MRN: [id number]  Chief Complaint  Patient presents with   Follow-up    Referring provider: Billie Lade, MD  HPI: 52 year old male, former smoker. PMH significant for HTN, OSA. Patient of Dr. Craige Cotta, last seen on 01/03/21   Previous [facility] pulmonary encounter: 01/03/21-Dr. Craige Cotta  He was previously followed by Dr. Kandyce Rud at [facility]  He had sleep study in November 2015 that showed severe obstructive sleep apnea.  He reports getting supplies from [employer].  His CPAP titration recommended setting of 11 cm H2O.  At the time of his study his BMI was 31 and now is 36.  When he first got his CPAP he felt like a new person.  More recently he is starting to feel tired during the day again.  He is waking up every couple of hours during the night.  He feels sluggish when he sits quiet.  He is using nasal pillow mask and no issues with mask fit.   He goes to sleep at 9 pm.  He falls asleep less than a minute.  He wakes up some times to use the bathroom.  He gets out of bed at 5 am.  He feels more tired in the morning.  He denies morning headache.  He does not use anything to help him fall sleep.  He drinks several cups of coffee during the day.   He denies sleep walking, sleep talking, bruxism, or nightmares.  There is no history of restless legs.  He denies sleep hallucinations, sleep paralysis, or cataplexy.   The Epworth score is 15 out of 24.  06/30/21- Telephone encounter  CPAP 03/31/21 to 06/28/21 >> used on 90 of 90 nights with average 8 hrs 4 min.  Average AHI 1.7 with CPAP 11 cm H2O.   Please let him know his CPAP report shows good control of sleep apnea with CPAP at 11 cm H2O.  If he feels his pressure is too low, then can send order to increase his CPAP to 12 cm H2O.    12/15/2022 Patient presents today for overdue follow-up. Hx sleep apnea. During last visit he it was intended that he establish with new DME that  accepts his insurance, needed new CPAP machine with auto settings 8-18cm h20  He is getting en error code stating that motor life has been exceeded He is 100% compliant with CPAP use. He reports benefit in slepe quality with use He can not sleep without weairng. Wearing 8 hours a night. Current CPAP pressure 12cm h20 No issues with mask fit or pressure settings  No snoring, waking up gasping or choking  Mild daytime fatigue  He has gained some weight since he quit smoking  He was getting supplies the last year through [place]. He tells me that [facility] in [place] takes his insurance   Sleep test: PSG 01/14/14 >> AHI 36.5, SpO2 low 87.1% CPAP titration 02/03/14 >> CPAP 11 cm H2O >> AHI 0  Allergies  Allergen Reactions   Onion Other (See Comments)    Migraines     Immunization History  Administered Date(s) Administered   PFIZER(Purple Top)SARS-COV-2 Vaccination 06/19/2019, 07/11/2019   Tdap 10/05/2021   Zoster Recombinant(Shingrix) 10/05/2021, 01/27/2022    Past Medical History:  Diagnosis Date   Arthritis    lumbar   Complication of anesthesia    difficulty waking up- sleepiness lasted longer than other times.  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   Hypertension    PONV (postoperative nausea and vomiting)    Sleep apnea    Cpap   Spondylolisthesis of lumbar region     Tobacco History: Social History   Tobacco Use  Smoking Status Former   Current packs/day: 0.00   Average packs/day: 0.3 packs/day for 4.0 years (1.0 ttl pk-yrs)   Types: Cigarettes   Start date: 12/16/2014   Quit date: 12/16/2018   Years since quitting: 4.0  Smokeless Tobacco Never   Counseling given: Not Answered   Outpatient Medications Prior to Visit  Medication Sig Dispense Refill   acetaminophen (TYLENOL) 500 MG tablet Take 1,000 mg by mouth every 6 (six) hours as needed for moderate pain or mild pain.     Naproxen Sodium  (ALEVE PO) Take by mouth. Once a day     olmesartan (BENICAR) 20 MG tablet Take 1 tablet by mouth once daily 90 tablet 0   No facility-administered medications prior to visit.   Review of Systems  Review of Systems  Constitutional: Negative.  Negative for fatigue.  Respiratory: Negative.    Cardiovascular: Negative.     Physical Exam  BP (!) 142/76   Pulse 67   Ht 5\' 7"  (1.702 m)   Wt 241 lb (109.3 kg)   SpO2 95%   BMI 37.75 kg/m  Physical Exam Constitutional:      Appearance: Normal appearance.  HENT:     Head: Normocephalic and atraumatic.  Pulmonary:     Effort: Pulmonary effort is normal.     Breath sounds: Normal breath sounds.  Neurological:     Mental Status: He is alert.      Lab Results:  CBC    Component Value Date/Time   WBC 5.0 04/05/2022 0759   WBC 7.7 09/17/2019 1448   RBC 4.51 04/05/2022 0759   RBC 4.48 09/17/2019 1448   HGB 14.1 04/05/2022 0759   HCT 41.2 04/05/2022 0759   PLT 150 04/05/2022 0759   MCV 91 04/05/2022 0759   MCH 31.3 04/05/2022 0759   MCH 31.7 09/17/2019 1448   MCHC 34.2 04/05/2022 0759   MCHC 34.9 09/17/2019 1448   RDW 11.9 04/05/2022 0759   LYMPHSABS 1.8 04/05/2022 0759   EOSABS 0.1 04/05/2022 0759   BASOSABS 0.1 04/05/2022 0759    BMET    Component Value Date/Time   NA 141 04/05/2022 0759   K 4.6 04/05/2022 0759   CL 105 04/05/2022 0759   CO2 23 04/05/2022 0759   GLUCOSE 106 (H) 04/05/2022 0759   GLUCOSE 107 (H) 02/07/2021 0919   BUN 15 04/05/2022 0759   CREATININE 1.15 04/05/2022 0759   CALCIUM 9.5 04/05/2022 0759   GFRNONAA >60 02/07/2021 0919   GFRAA >60 09/17/2019 1448    BNP No results found for: "BNP"  ProBNP No results found for: "PROBNP"  Imaging: No results found.   Assessment & Plan:   No problem-specific Assessment & Plan notes found for this encounter.     Glenford Bayley, NP 12/15/2022

## 2023-01-11 ENCOUNTER — Other Ambulatory Visit: Payer: Self-pay | Admitting: Internal Medicine

## 2023-01-11 DIAGNOSIS — I10 Essential (primary) hypertension: Secondary | ICD-10-CM

## 2023-01-24 ENCOUNTER — Other Ambulatory Visit: Payer: Self-pay

## 2023-01-24 DIAGNOSIS — Z0001 Encounter for general adult medical examination with abnormal findings: Secondary | ICD-10-CM

## 2023-01-25 LAB — CBC WITH DIFFERENTIAL/PLATELET
Basophils Absolute: 0.1 10*3/uL (ref 0.0–0.2)
Basos: 1 %
EOS (ABSOLUTE): 0.2 10*3/uL (ref 0.0–0.4)
Eos: 3 %
Hematocrit: 48.1 % (ref 37.5–51.0)
Hemoglobin: 16 g/dL (ref 13.0–17.7)
Immature Grans (Abs): 0 10*3/uL (ref 0.0–0.1)
Immature Granulocytes: 0 %
Lymphocytes Absolute: 2 10*3/uL (ref 0.7–3.1)
Lymphs: 36 %
MCH: 30.9 pg (ref 26.6–33.0)
MCHC: 33.3 g/dL (ref 31.5–35.7)
MCV: 93 fL (ref 79–97)
Monocytes Absolute: 0.7 10*3/uL (ref 0.1–0.9)
Monocytes: 11 %
Neutrophils Absolute: 2.8 10*3/uL (ref 1.4–7.0)
Neutrophils: 49 %
Platelets: 171 10*3/uL (ref 150–450)
RBC: 5.18 x10E6/uL (ref 4.14–5.80)
RDW: 11.9 % (ref 11.6–15.4)
WBC: 5.7 10*3/uL (ref 3.4–10.8)

## 2023-01-25 LAB — CMP14+EGFR
ALT: 35 [IU]/L (ref 0–44)
AST: 27 [IU]/L (ref 0–40)
Albumin: 4.5 g/dL (ref 3.8–4.9)
Alkaline Phosphatase: 80 [IU]/L (ref 44–121)
BUN/Creatinine Ratio: 12 (ref 9–20)
BUN: 13 mg/dL (ref 6–24)
Bilirubin Total: 0.8 mg/dL (ref 0.0–1.2)
CO2: 23 mmol/L (ref 20–29)
Calcium: 9.6 mg/dL (ref 8.7–10.2)
Chloride: 104 mmol/L (ref 96–106)
Creatinine, Ser: 1.13 mg/dL (ref 0.76–1.27)
Globulin, Total: 2.5 g/dL (ref 1.5–4.5)
Glucose: 114 mg/dL — ABNORMAL HIGH (ref 70–99)
Potassium: 4.9 mmol/L (ref 3.5–5.2)
Sodium: 141 mmol/L (ref 134–144)
Total Protein: 7 g/dL (ref 6.0–8.5)
eGFR: 77 mL/min/{1.73_m2} (ref >59)

## 2023-01-25 LAB — HEMOGLOBIN A1C
Est. average glucose Bld gHb Est-mCnc: 131 mg/dL
Hgb A1c MFr Bld: 6.2 % — ABNORMAL HIGH (ref 4.8–5.6)

## 2023-01-25 LAB — LIPID PANEL
Chol/HDL Ratio: 4.6 ratio (ref 0.0–5.0)
Cholesterol, Total: 197 mg/dL (ref 100–199)
HDL: 43 mg/dL (ref >39)
LDL Chol Calc (NIH): 131 mg/dL — ABNORMAL HIGH (ref 0–99)
Triglycerides: 129 mg/dL (ref 0–149)
VLDL Cholesterol Cal: 23 mg/dL (ref 5–40)

## 2023-01-25 LAB — B12 AND FOLATE PANEL
Folate: 6.9 ng/mL (ref >3.0)
Vitamin B-12: 523 pg/mL (ref 232–1245)

## 2023-01-25 LAB — TSH+FREE T4
Free T4: 0.95 ng/dL (ref 0.82–1.77)
TSH: 2.18 u[IU]/mL (ref 0.450–4.500)

## 2023-01-25 LAB — VITAMIN D 25 HYDROXY (VIT D DEFICIENCY, FRACTURES): Vit D, 25-Hydroxy: 29.3 ng/mL — ABNORMAL LOW (ref 30.0–100.0)

## 2023-01-30 ENCOUNTER — Ambulatory Visit (INDEPENDENT_AMBULATORY_CARE_PROVIDER_SITE_OTHER): Payer: Managed Care, Other (non HMO) | Admitting: Internal Medicine

## 2023-01-30 ENCOUNTER — Encounter: Payer: Self-pay | Admitting: Internal Medicine

## 2023-01-30 VITALS — BP 152/79 | HR 93 | Ht 67.0 in | Wt 242.8 lb

## 2023-01-30 DIAGNOSIS — R7303 Prediabetes: Secondary | ICD-10-CM | POA: Diagnosis not present

## 2023-01-30 DIAGNOSIS — E559 Vitamin D deficiency, unspecified: Secondary | ICD-10-CM

## 2023-01-30 DIAGNOSIS — Z0001 Encounter for general adult medical examination with abnormal findings: Secondary | ICD-10-CM

## 2023-01-30 DIAGNOSIS — E66812 Obesity, class 2: Secondary | ICD-10-CM | POA: Diagnosis not present

## 2023-01-30 DIAGNOSIS — Z6836 Body mass index (BMI) 36.0-36.9, adult: Secondary | ICD-10-CM

## 2023-01-30 DIAGNOSIS — I10 Essential (primary) hypertension: Secondary | ICD-10-CM | POA: Diagnosis not present

## 2023-01-30 DIAGNOSIS — E782 Mixed hyperlipidemia: Secondary | ICD-10-CM | POA: Diagnosis not present

## 2023-01-30 DIAGNOSIS — H9193 Unspecified hearing loss, bilateral: Secondary | ICD-10-CM

## 2023-01-30 MED ORDER — ATORVASTATIN CALCIUM 20 MG PO TABS: 20.0000 mg | ORAL_TABLET | Freq: Every day | ORAL | 3 refills | Status: DC

## 2023-01-30 MED ORDER — AMLODIPINE-OLMESARTAN 5-20 MG PO TABS: 1.0000 | ORAL_TABLET | Freq: Every day | ORAL | 2 refills | Status: DC

## 2023-01-30 MED ORDER — SEMAGLUTIDE-WEIGHT MANAGEMENT 0.25 MG/0.5ML ~~LOC~~ SOAJ: 0.2500 mg | SUBCUTANEOUS | 0 refills | Status: AC

## 2023-01-30 NOTE — Assessment & Plan Note (Signed)
Previously referred to audiology but not covered by insurance.  Hearing remains impaired, but overall no change in symptoms.

## 2023-01-30 NOTE — Assessment & Plan Note (Addendum)
Annual physical completed today.  Previous records and labs reviewed. -Labs updated last week (11/13) -Preventative care items are up-to-date -Discontinuing olmesartan in favor of amlodipine-olmesartan for improved treatment of hypertension -Start [facility] for morbid obesity and prediabetes -Start atorvastatin 20 mg daily for treatment of hyperlipidemia -Follow-up in 3 months for reassessment

## 2023-01-30 NOTE — Assessment & Plan Note (Signed)
Lipid panel updated last week.  Total cholesterol 197 and LDL 131, increased from 110 previously.  Intermediate ASCVD risk.  Through shared decision making, atorvastatin 20 mg daily has been started today.

## 2023-01-30 NOTE — Assessment & Plan Note (Signed)
A1c has increased to 6.2 from 5.9 previously.  Lifestyle modifications aimed at weight loss and lowering his blood sugar were reviewed again today.  We will also attempt start Wegovy in the setting of obesity.

## 2023-01-30 NOTE — Assessment & Plan Note (Signed)
His weight today is 242 pounds.  BMI 38.  We have previously discussed Wegovy and Zepbound as medication options for weight loss.  Through shared decision making, Wegovy 0.25 mg weekly has been prescribed today. -Follow-up in 3 months for reassessment

## 2023-01-30 NOTE — Assessment & Plan Note (Signed)
Currently prescribed olmesartan 20 mg daily.  BP is elevated today.  Discontinue olmesartan in favor of amlodipine-olmesartan 5-20 mg daily.

## 2023-01-30 NOTE — Assessment & Plan Note (Signed)
Noted on recent labs.  Daily vitamin D supplementation recommended. 

## 2023-01-30 NOTE — Patient Instructions (Signed)
It was a pleasure to see you today.  Thank you for giving [place] the opportunity to be involved in your care.  Below is a brief recap of your visit and next steps.  We will plan to see you again in 3 months.  Summary Annual physical completed today Discontinue olmesartan and start amlodipine-olmesartan 5-20 mg daily Start Wegovy for weight loss Add atorvastatin 20 mg daily for hyperlipidemia Follow up in 3 months

## 2023-01-30 NOTE — Progress Notes (Signed)
Complete physical exam  Patient: Troy Franklin   DOB: 08-19-1970   52 y.o. Male  MRN: [id number]  Subjective:    Chief Complaint  Patient presents with   Annual Exam    Troy Franklin is a 52 y.o. male who presents today for a complete physical exam. He reports consuming a general diet. The patient does not participate in regular exercise at present. He generally feels fairly well. He reports sleeping fairly well. He does not have additional problems to discuss today.    Most recent fall risk assessment:    01/30/2023    1:03 PM  Fall Risk   Falls in the past year? 0  Number falls in past yr: 0  Injury with Fall? 0  Risk for fall due to : No Fall Risks  Follow up Falls evaluation completed     Most recent depression screenings:    01/30/2023    1:03 PM 10/17/2022    3:04 PM  PHQ 2/9 Scores  PHQ - 2 Score 0 0    Vision:Not within last year  and Dental: No current dental problems  Past Medical History:  Diagnosis Date   Arthritis    lumbar   Complication of anesthesia    difficulty waking up- sleepiness lasted longer than other times.    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   Hypertension    PONV (postoperative nausea and vomiting)    Sleep apnea    Cpap   Spondylolisthesis of lumbar region    Past Surgical History:  Procedure Laterality Date   ANKLE FRACTURE SURGERY     right - pins and screws   BACK SURGERY     fusion 2014, laminectomy 2015, laminectomy and fusion 2019, fusion 2021   BIOPSY  02/09/2021   Procedure: BIOPSY;  Surgeon: Dolores Frame, MD;  Location: [facility];  Service: Gastroenterology;;   CATARACT EXTRACTION Right 03/29/2022   CATARACT EXTRACTION Left 04/2022   CERVICAL FUSION  2013   CERVICAL FUSION     2007 & 2013 & March 2022   COLONOSCOPY WITH PROPOFOL N/A 02/09/2021   Procedure: COLONOSCOPY WITH PROPOFOL;  Surgeon: Dolores Frame, MD;  Location: [facility];  Service: Gastroenterology;  Laterality: N/A;  7:30   HERNIA REPAIR  1974   hydrocele   KNEE LIGAMENT RECONSTRUCTION     right 1987   LUMBAR LAMINECTOMY/DECOMPRESSION MICRODISCECTOMY N/A 10/10/2017   Procedure: Lumbar Three-Four Laminectomy;  Surgeon: Coletta Memos, MD;  Location: [facility] OR;  Service: Neurosurgery;  Laterality: N/A;   POLYPECTOMY  02/09/2021   Procedure: POLYPECTOMY;  Surgeon: Dolores Frame, MD;  Location: [facility];  Service: Gastroenterology;;   VASECTOMY     Social History   Socioeconomic History   Marital status: Married    Spouse name: Not on file   Number of children: 1   Years of education: Not on file   Highest education level: Not on file  Occupational History   Occupation: [employer]    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  Tobacco Use   Smoking status: Former    Current packs/day: 0.00    Average packs/day: 0.3 packs/day for 4.0 years (1.0 ttl pk-yrs)    Types: Cigarettes    Start date: 12/16/2014    Quit date: 12/16/2018    Years since quitting: 4.1   Smokeless tobacco: Never  Vaping Use   Vaping status: Never Used  Substance and Sexual Activity   Alcohol use: Not Currently    Comment: socially- x1/month   Drug use: No   Sexual activity: Yes  Other Topics Concern   Not on file  Social History Narrative   1 child, 1 stepchild   Social Determinants of Health   Financial Resource Strain: Not on file  Food Insecurity: Not on file  Transportation Needs: Not on file  Physical Activity: Not on file  Stress: Not on file  Social Connections: Not on file  Intimate Partner Violence: Not on file   Family History  Problem Relation Age of Onset   Colon cancer Neg Hx    Allergies  Allergen Reactions   Onion Other (See Comments)    Migraines    Patient Care Team: Billie Lade, MD as PCP - General (Internal Medicine)   Outpatient Medications Prior to Visit  Medication Sig   acetaminophen (TYLENOL) 500 MG  tablet Take 1,000 mg by mouth every 6 (six) hours as needed for moderate pain or mild pain.   Naproxen Sodium (ALEVE PO) Take by mouth. Once a day   [DISCONTINUED] olmesartan (BENICAR) 20 MG tablet Take 1 tablet by mouth once daily   No facility-administered medications prior to visit.   Review of Systems  Constitutional:  Negative for chills and fever.  HENT:  Negative for sore throat.   Respiratory:  Negative for cough and shortness of breath.   Cardiovascular:  Negative for chest pain, palpitations and leg swelling.  Gastrointestinal:  Negative for abdominal pain, blood in stool, constipation, diarrhea, nausea and vomiting.  Genitourinary:  Negative for dysuria and hematuria.  Musculoskeletal:  Negative for myalgias.  Skin:  Negative for itching and rash.  Neurological:  Negative for dizziness and headaches.  Psychiatric/Behavioral:  Negative for depression and suicidal ideas.       Objective:     BP (!) 152/79 (BP Location: Right Arm, Patient Position: Sitting, Cuff Size: Large)   Pulse 93   Ht 5\' 7"  (1.702 m)   Wt 242 lb 12.8 oz (110.1 kg)   SpO2 94%   BMI 38.03 kg/m  BP Readings from Last 3 Encounters:  01/30/23 (!) 152/79  12/15/22 (!) 142/76  10/17/22 117/74   Wt Readings from Last 3 Encounters:  01/30/23 242 lb 12.8 oz (110.1 kg)  12/15/22 241 lb (109.3 kg)  10/17/22 240 lb (108.9 kg)   Physical Exam Vitals reviewed.  Constitutional:      General: He is not in acute distress.    Appearance: Normal appearance. He is obese. He is not ill-appearing.  HENT:     Head: Normocephalic and atraumatic.     Right Ear: Tympanic membrane, ear canal and external ear normal.     Left Ear: Tympanic membrane, ear canal and external ear normal.     Nose: Nose normal. No congestion or rhinorrhea.     Mouth/Throat:     Mouth: Mucous membranes are moist.     Pharynx: Oropharynx is clear.  Eyes:     General: No scleral icterus.    Extraocular Movements: Extraocular movements  intact.     Conjunctiva/sclera: Conjunctivae normal.     Pupils: Pupils are equal, round, and reactive to light.  Cardiovascular:     Rate and Rhythm: Normal rate and regular rhythm.     Pulses: Normal pulses.     Heart sounds: Normal heart sounds. No murmur heard. Pulmonary:     Effort: Pulmonary effort is normal.  Breath sounds: Normal breath sounds. No wheezing, rhonchi or rales.  Abdominal:     General: Abdomen is flat. Bowel sounds are normal. There is no distension.     Palpations: Abdomen is soft.     Tenderness: There is no abdominal tenderness.  Musculoskeletal:        General: No swelling or deformity. Normal range of motion.     Cervical back: Normal range of motion.  Skin:    General: Skin is warm and dry.     Capillary Refill: Capillary refill takes less than 2 seconds.  Neurological:     General: No focal deficit present.     Mental Status: He is alert and oriented to person, place, and time.     Motor: No weakness.  Psychiatric:        Mood and Affect: Mood normal.        Behavior: Behavior normal.        Thought Content: Thought content normal.   Last CBC Lab Results  Component Value Date   WBC 5.7 01/24/2023   HGB 16.0 01/24/2023   HCT 48.1 01/24/2023   MCV 93 01/24/2023   MCH 30.9 01/24/2023   RDW 11.9 01/24/2023   PLT 171 01/24/2023   Last metabolic panel Lab Results  Component Value Date   GLUCOSE 114 (H) 01/24/2023   NA 141 01/24/2023   K 4.9 01/24/2023   CL 104 01/24/2023   CO2 23 01/24/2023   BUN 13 01/24/2023   CREATININE 1.13 01/24/2023   EGFR 77 01/24/2023   CALCIUM 9.6 01/24/2023   PROT 7.0 01/24/2023   ALBUMIN 4.5 01/24/2023   LABGLOB 2.5 01/24/2023   AGRATIO 1.9 04/05/2022   BILITOT 0.8 01/24/2023   ALKPHOS 80 01/24/2023   AST 27 01/24/2023   ALT 35 01/24/2023   ANIONGAP 7 02/07/2021   Last lipids Lab Results  Component Value Date   CHOL 197 01/24/2023   HDL 43 01/24/2023   LDLCALC 131 (H) 01/24/2023   TRIG 129  01/24/2023   CHOLHDL 4.6 01/24/2023   Last hemoglobin A1c Lab Results  Component Value Date   HGBA1C 6.2 (H) 01/24/2023   Last thyroid functions Lab Results  Component Value Date   TSH 2.180 01/24/2023   Last vitamin D Lab Results  Component Value Date   VD25OH 29.3 (L) 01/24/2023   Last vitamin B12 and Folate Lab Results  Component Value Date   VITAMINB12 523 01/24/2023   FOLATE 6.9 01/24/2023        Assessment & Plan:    Routine Health Maintenance and Physical Exam  Immunization History  Administered Date(s) Administered   PFIZER(Purple Top)SARS-COV-2 Vaccination 06/19/2019, 07/11/2019   Tdap 10/05/2021   Zoster Recombinant(Shingrix) 10/05/2021, 01/27/2022    Health Maintenance  Topic Date Due   INFLUENZA VACCINE  06/11/2023 (Originally 10/12/2022)   Colonoscopy  02/10/2024   DTaP/Tdap/Td (2 - Td or Tdap) 10/06/2031   Hepatitis C Screening  Completed   HIV Screening  Completed   Zoster Vaccines- Shingrix  Completed   HPV VACCINES  Aged Out   COVID-19 Vaccine  Discontinued    Discussed health benefits of physical activity, and encouraged him to engage in regular exercise appropriate for his age and condition.  Problem List Items Addressed This Visit       HTN (hypertension)    Currently prescribed olmesartan 20 mg daily.  BP is elevated today.  Discontinue olmesartan in favor of amlodipine-olmesartan 5-20 mg daily.      Bilateral  hearing loss    Previously referred to audiology but not covered by insurance.  Hearing remains impaired, but overall no change in symptoms.      Hyperlipidemia - Primary    Lipid panel updated last week.  Total cholesterol 197 and LDL 131, increased from 110 previously.  Intermediate ASCVD risk.  Through shared decision making, atorvastatin 20 mg daily has been started today.      Obesity    His weight today is 242 pounds.  BMI 38.  We have previously discussed Wegovy and Zepbound as medication options for weight loss.   Through shared decision making, Wegovy 0.25 mg weekly has been prescribed today. -Follow-up in 3 months for reassessment      Prediabetes    A1c has increased to 6.2 from 5.9 previously.  Lifestyle modifications aimed at weight loss and lowering his blood sugar were reviewed again today.  We will also attempt start Wegovy in the setting of obesity.      Encounter for well adult exam with abnormal findings    Annual physical completed today.  Previous records and labs reviewed. -Labs updated last week (11/13) -Preventative care items are up-to-date -Discontinuing olmesartan in favor of amlodipine-olmesartan for improved treatment of hypertension -Start [facility] for morbid obesity and prediabetes -Start atorvastatin 20 mg daily for treatment of hyperlipidemia -Follow-up in 3 months for reassessment      Vitamin D insufficiency    Noted on recent labs.  Daily vitamin D supplementation recommended.      Return in about 3 months (around 05/02/2023).  Billie Lade, MD

## 2023-01-31 ENCOUNTER — Ambulatory Visit: Payer: Managed Care, Other (non HMO) | Admitting: Internal Medicine

## 2023-02-13 ENCOUNTER — Encounter: Payer: Self-pay | Admitting: Internal Medicine

## 2023-02-14 ENCOUNTER — Other Ambulatory Visit: Payer: Self-pay | Admitting: Internal Medicine

## 2023-02-14 DIAGNOSIS — R7303 Prediabetes: Secondary | ICD-10-CM

## 2023-02-14 MED ORDER — METFORMIN HCL ER 500 MG PO TB24: 500.0000 mg | ORAL_TABLET | Freq: Two times a day (BID) | ORAL | 1 refills | Status: DC

## 2023-03-23 ENCOUNTER — Encounter: Payer: Self-pay | Admitting: Primary Care

## 2023-03-23 ENCOUNTER — Telehealth (INDEPENDENT_AMBULATORY_CARE_PROVIDER_SITE_OTHER): Payer: Managed Care, Other (non HMO) | Admitting: Primary Care

## 2023-03-23 DIAGNOSIS — G4733 Obstructive sleep apnea (adult) (pediatric): Secondary | ICD-10-CM

## 2023-03-23 NOTE — Progress Notes (Signed)
 Virtual Visit via Video Note  I connected with Troy Franklin on 03/23/23 at 11:30 AM EST by a video enabled telemedicine application and verified that I am speaking with the correct person using two identifiers.  Location: Patient: Home Provider: Office    I discussed the limitations of evaluation and management by telemedicine and the availability of in person appointments. The patient expressed understanding and agreed to proceed.  History of Present Illness: 53 year old male, former smoker. PMH significant for HTN, OSA. Patient of Dr. Shellia, last seen on 01/03/21  Previous [facility] pulmonary encounter: 01/03/21-Dr. Shellia  He was previously followed by Dr. Jerona Sayre at [facility].  He had sleep study in November 2015 that showed severe obstructive sleep apnea.  He reports getting supplies from [facility].  His CPAP titration recommended setting of 11 cm H2O.  At the time of his study his BMI was 31 and now is 36.  When he first got his CPAP he felt like a new person.  More recently he is starting to feel tired during the day again.  He is waking up every couple of hours during the night.  He feels sluggish when he sits quiet.  He is using nasal pillow mask and no issues with mask fit.   He goes to sleep at 9 pm.  He falls asleep less than a minute.  He wakes up some times to use the bathroom.  He gets out of bed at 5 am.  He feels more tired in the morning.  He denies morning headache.  He does not use anything to help him fall sleep.  He drinks several cups of coffee during the day.   He denies sleep walking, sleep talking, bruxism, or nightmares.  There is no history of restless legs.  He denies sleep hallucinations, sleep paralysis, or cataplexy.   The Epworth score is 15 out of 24.  03/23/2023- INTERIM Discussed the use of AI scribe software for clinical note transcription with the patient, who gave verbal consent to proceed.  History of Present Illness   The patient, with a history  of severe sleep apnea diagnosed in 2015, has been compliant with CPAP therapy, using the device for an average of 7.5 to 8 hours per night. The patient recently switched his DME from [facility] to [facility] and received a new CPAP machine. The pressure is set at 12cm h20 with residual apnea score 1 event per hour, indicating well-controlled sleep apnea.  Despite the successful management of sleep apnea, the patient reports frequent awakenings during the night. He typically goes to bed around 9 PM and falls asleep quickly, but wakes up multiple times throughout the night. The patient denies any issues with the CPAP machine or mask and expresses a preference for using the device. The awakenings are not associated with any symptoms of reflux or shortness of breath.  The patient has not tried any over-the-counter medications or supplements to aid sleep maintenance. He expresses a desire to improve his sleep quality.   Airview download 02/20/2023 - 03/21/2023 Usage days 30/30 days (100%) greater than 4 hours Average usage 7 hours 54 minutes Pressure 12 cm H2O Air leaks 3.5 L/min (95%) AHI 1.0  Observations/Objective:   Assessment and Plan:  1. OSA (obstructive sleep apnea) (Primary)     Obstructive Sleep Apnea Dx with severe sleep apnea in 2015. Received new CPAP unit and changed DME companies since last OV. He remains 100% compliant with CPAP use, averaging 7.5-8 hours  per night. Current pressure 12cm h20 with residual apnea index score of 1 event per hour, indicating well-controlled sleep apnea  -Continue current CPAP use nightly 4-6 hours.  Insomnia Reports frequent awakenings during the night, not related to sleep apnea or CPAP use. No current use of over-the-counter sleep aids. -Trial of melatonin 2.5-5mg  at bedtime, with potential to increase to 10mg  if needed. -Consider lifestyle modifications such as avoiding screens before bed, regular exercise, sunlight exposure, limiting naps,  and restricting bed use to sleep. -Contact provider via MyChart if sleep does not improve or for guidance on adjusting melatonin dose.  Follow Up Instructions:  Follow-up in 1 year for sleep apnea management, or sooner if issues with insomnia persist.       I discussed the assessment and treatment plan with the patient. The patient was provided an opportunity to ask questions and all were answered. The patient agreed with the plan and demonstrated an understanding of the instructions.   The patient was advised to call back or seek an in-person evaluation if the symptoms worsen or if the condition fails to improve as anticipated.  I provided 22 minutes of non-face-to-face time during this encounter.   Almarie LELON Ferrari, NP

## 2023-03-23 NOTE — Patient Instructions (Signed)
 Continue CPAP nightly Trial 2.5-5mg  melatonin at bedtime (10mg  max dose) Follow up in 1 year or sooner if needed

## 2023-04-10 ENCOUNTER — Ambulatory Visit (HOSPITAL_COMMUNITY)
Admission: RE | Admit: 2023-04-10 | Discharge: 2023-04-10 | Disposition: A | Discharge status: Home / Self Care | Payer: Managed Care, Other (non HMO) | Source: Ambulatory Visit | Attending: Family Medicine | Admitting: Family Medicine

## 2023-04-10 ENCOUNTER — Ambulatory Visit: Payer: Managed Care, Other (non HMO) | Admitting: Family Medicine

## 2023-04-10 ENCOUNTER — Ambulatory Visit: Payer: Self-pay | Admitting: Internal Medicine

## 2023-04-10 ENCOUNTER — Encounter: Payer: Self-pay | Admitting: Family Medicine

## 2023-04-10 VITALS — BP 135/82 | HR 83 | Ht 67.0 in | Wt 244.1 lb

## 2023-04-10 DIAGNOSIS — J40 Bronchitis, not specified as acute or chronic: Secondary | ICD-10-CM | POA: Insufficient documentation

## 2023-04-10 DIAGNOSIS — R509 Fever, unspecified: Secondary | ICD-10-CM | POA: Insufficient documentation

## 2023-04-10 DIAGNOSIS — I10 Essential (primary) hypertension: Secondary | ICD-10-CM

## 2023-04-10 MED ORDER — AMOXICILLIN-POT CLAVULANATE 875-125 MG PO TABS: 1.0000 | ORAL_TABLET | Freq: Two times a day (BID) | ORAL | 0 refills | Status: DC

## 2023-04-10 MED ORDER — PREDNISONE 10 MG PO TABS: 10.0000 mg | ORAL_TABLET | Freq: Two times a day (BID) | ORAL | 0 refills | Status: DC

## 2023-04-10 MED ORDER — BENZONATATE 200 MG PO CAPS: 200.0000 mg | ORAL_CAPSULE | Freq: Two times a day (BID) | ORAL | 0 refills | Status: DC | PRN

## 2023-04-10 MED ORDER — PROMETHAZINE-DM 6.25-15 MG/5ML PO SYRP: ORAL_SOLUTION | ORAL | 0 refills | Status: DC

## 2023-04-10 NOTE — Patient Instructions (Signed)
Follow-up with Dr. Durwin Nora as before call if you need to be seen sooner.  You are treated today for acute bronchitis.  Respiratory viral panel to be done at the office today.  Work excuse from January 27 to return April 13, 2023.  Please get a chest x-ray at the hospital after leaving today go to the radiology department.  I will send you a message regarding the report as soon as it is available later today.  Medications are prescribed and they are Augmentin, Tessalon Perles, prednisone, and Phenergan DM.  Please take as directed.  Ensure you get sufficient rest and drink a lot of fluids especially water to keep adequately hydrated.    If your symptoms worsen or fail to improve please either call back for a sooner appointment or go to  the urgent care for reevaluation.  Thanks for choosing [facility], we consider it a privelige to serve you.

## 2023-04-10 NOTE — Progress Notes (Signed)
   [name]     MRN: [id number]      DOB: 1970-05-24  Chief Complaint  Patient presents with   Cough    Cough congestion fever sick since christmas     HPI Mr. Troy Franklin is here headd and chest congestion since 12/25, no improvement worse x 3 days, fever to 102 this past Sunday, left work yesterday for the first time in 30 yrs Feels as though truck ran over hime with fatigue and body aches Covid exposure last week on the job, negative test yesterday, Dry cough, clear mucus  ROS DDenies chest pains, palpitations and leg swelling Denies abdominal pain, nausea, vomiting,diarrhea or constipation.   Denies dysuria, frequency, hesitancy or incontinence. Denies joint pain, swelling and limitation in mobility. Denies headaches, seizures, numbness, or tingling. Denies depression,  Denies skin break down or rash.   PE  BP 135/82 (BP Location: Right Arm, Patient Position: Sitting, Cuff Size: Large)   Pulse 83   Ht 5\' 7"  (1.702 m)   Wt 244 lb 1.9 oz (110.7 kg)   SpO2 93%   BMI 38.23 kg/m   Patient alert and oriented and in no cardiopulmonary distress.  HEENT: No facial asymmetry, EOMI,     Neck supple .Mild maxillary sinus tenderness, nasal congestion, cervical adenitis bilaterally  Chest: .decreased air entry few basilar wheezes   CVS: S1, S2 no murmurs, no S3.Regular rate.  Ext: No edema  MS: Adequate ROM spine, shoulders, hips and knees.  Skin: Intact, no ulcerations or rash noted.  Psych: Good eye contact, normal affect. Memory intact not anxious or depressed appearing.  CNS: CN 2-12 intact, power,  normal throughout.no focal deficits noted.   Assessment & Plan Bronchitis Cxr, tessalon perle, augmentin, phenergan DM, prednisone  are prescribed Pt encouraged to rest and ensure adequate water intake . Viral testing also done. work excuse 1/27 to 1/31  HTN (hypertension) Controlled, no change in medication   Febrile illness Viral swabs sent , 2 day h/o fever ,  body aches and chills and possitive covid exposure at work

## 2023-04-10 NOTE — Assessment & Plan Note (Addendum)
Cxr, tessalon perle, augmentin, phenergan DM, prednisone  are prescribed Pt encouraged to rest and ensure adequate water intake . Viral testing also done. work excuse 1/27 to 1/31

## 2023-04-10 NOTE — Assessment & Plan Note (Addendum)
Viral swabs sent , 2 day h/o fever , body aches and chills and possitive covid exposure at work

## 2023-04-10 NOTE — Assessment & Plan Note (Signed)
Controlled, no change in medication

## 2023-04-10 NOTE — Telephone Encounter (Signed)
Chief Complaint: cough and fever Symptoms: non-productive cough, fever of 101-102F, chest pain with coughing Frequency: sick since Christmas Pertinent Negatives: Patient denies breathing difficulty at rest, CP when not coughing, vomiting, hemoptysis Disposition: [] ED /[] Urgent Care (no appt availability in office) / [x] Appointment(In office/virtual)/ []  [facility]/ [] Home Care/ [] Refused Recommended Disposition /[] [facility]/ []  Follow-up with PCP Additional Notes: Pt reports being sick with a cough and congestion since Christmas. Pt reports new fever of 101-102 for the last few days. Taking Delsym and Tylenol Sinus with no relief. Coughing is severe and every 5 minutes. Chest pain with coughing, breathing difficulty with coughing. Cough is non-productive - no hemoptysis. Pt checked O2 while on phone with nurse - O2 92 on RA and pulse in the 80s. Pt states his normal O2 is 95-97. No hx or lung or cardiac disease. Per protocol, pt scheduled in office today at 1020. Pt advised to call back for any worsening between now and then. Pt verbalized understanding. Copied from CRM [id number]. Topic: Clinical - Red Word Triage >> Apr 10, 2023  9:25 AM Marlow Baars wrote: Red Word that prompted transfer to Nurse Triage: The patient called in with some shortness of breath and states his pain is around a 7-8 when he gets to coughing deep. I transferred patient over the E2C2 NT due to red word symptoms Reason for Disposition  SEVERE coughing spells (e.g., whooping sound after coughing, vomiting after coughing)  Answer Assessment - Initial Assessment Questions 1. RESPIRATORY STATUS: "Describe your breathing?" (e.g., wheezing, shortness of breath, unable to speak, severe coughing)      Cough with pain, pain with taking a deep breath, pt states he is taking shallow breaths due to pain 2. ONSET: "When did this breathing problem begin?"      Had a cold since Christmas, has not gotten better 3. PATTERN  "Does the difficult breathing come and go, or has it been constant since it started?"      Coughing every 5 minutes, not productive - hacking and wincing from pain 4. SEVERITY: "How bad is your breathing?" (e.g., mild, moderate, severe)    - MILD: No SOB at rest, mild SOB with walking, speaks normally in sentences, can lie down, no retractions, pulse < 100.    - MODERATE: SOB at rest, SOB with minimal exertion and prefers to sit, cannot lie down flat, speaks in phrases, mild retractions, audible wheezing, pulse 100-120.    - SEVERE: Very SOB at rest, speaks in single words, struggling to breathe, sitting hunched forward, retractions, pulse > 120      Mild 5. RECURRENT SYMPTOM: "Have you had difficulty breathing before?" If Yes, ask: "When was the last time?" and "What happened that time?"      No 6. CARDIAC HISTORY: "Do you have any history of heart disease?" (e.g., heart attack, angina, bypass surgery, angioplasty)      No 7. LUNG HISTORY: "Do you have any history of lung disease?"  (e.g., pulmonary embolus, asthma, emphysema)     No 8. CAUSE: "What do you think is causing the breathing problem?"      Sick since Christmas 9. OTHER SYMPTOMS: "Do you have any other symptoms? (e.g., dizziness, runny nose, cough, chest pain, fever)     Fever of 101-102 over the last few days, cough, chest pain with cough, runny nose, sneezing. Took Delsym, not effective. Taking Tylenol Sinus - doesn't stop the cough. 10. O2 SATURATION MONITOR:  "Do you use an oxygen  saturation monitor (pulse oximeter) at home?" If Yes, ask: "What is your reading (oxygen level) today?" "What is your usual oxygen saturation reading?" (e.g., 95%)       "92 pulse ox, HR 87" - 95-97 typical for patient, sometimes 99. Wears a CPAP  Answer Assessment - Initial Assessment Questions 1. ONSET: "When did the cough begin?"      Since Christmas 2. SEVERITY: "How bad is the cough today?"      Every 5 minutes 3. SPUTUM: "Describe the color of  your sputum" (none, dry cough; clear, white, yellow, green)     Dry cough 4. HEMOPTYSIS: "Are you coughing up any blood?" If so ask: "How much?" (flecks, streaks, tablespoons, etc.)     No blood 5. DIFFICULTY BREATHING: "Are you having difficulty breathing?" If Yes, ask: "How bad is it?" (e.g., mild, moderate, severe)    - MILD: No SOB at rest, mild SOB with walking, speaks normally in sentences, can lie down, no retractions, pulse < 100.    - MODERATE: SOB at rest, SOB with minimal exertion and prefers to sit, cannot lie down flat, speaks in phrases, mild retractions, audible wheezing, pulse 100-120.    - SEVERE: Very SOB at rest, speaks in single words, struggling to breathe, sitting hunched forward, retractions, pulse > 120      Mild - only difficult with coughing 6. FEVER: "Do you have a fever?" If Yes, ask: "What is your temperature, how was it measured, and when did it start?"     101-102F the last few days 7. CARDIAC HISTORY: "Do you have any history of heart disease?" (e.g., heart attack, congestive heart failure)      No 8. LUNG HISTORY: "Do you have any history of lung disease?"  (e.g., pulmonary embolus, asthma, emphysema)     No 10. OTHER SYMPTOMS: "Do you have any other symptoms?" (e.g., runny nose, wheezing, chest pain)       Rattling in chest on exhale, runny nose, chest pain w/ coughing, fever  Protocols used: Breathing Difficulty-A-AH, Cough - Acute Productive-A-AH

## 2023-04-12 ENCOUNTER — Encounter: Payer: Self-pay | Admitting: Family Medicine

## 2023-04-12 LAB — COVID-19, FLU A+B AND RSV
Influenza A, NAA: DETECTED — AB
Influenza B, NAA: NOT DETECTED
RSV, NAA: NOT DETECTED
SARS-CoV-2, NAA: NOT DETECTED

## 2023-04-12 MED ORDER — OSELTAMIVIR PHOSPHATE 75 MG PO CAPS: 75.0000 mg | ORAL_CAPSULE | Freq: Two times a day (BID) | ORAL | 0 refills | Status: DC

## 2023-04-12 NOTE — Addendum Note (Signed)
Addended by: Kerri Perches on: 04/12/2023 07:20 AM   Modules accepted: Orders

## 2023-04-14 ENCOUNTER — Other Ambulatory Visit: Payer: Self-pay | Admitting: Internal Medicine

## 2023-04-14 DIAGNOSIS — I10 Essential (primary) hypertension: Secondary | ICD-10-CM

## 2023-04-25 ENCOUNTER — Encounter: Payer: Self-pay | Admitting: Internal Medicine

## 2023-04-25 DIAGNOSIS — E782 Mixed hyperlipidemia: Secondary | ICD-10-CM

## 2023-04-25 DIAGNOSIS — R7303 Prediabetes: Secondary | ICD-10-CM

## 2023-04-29 ENCOUNTER — Other Ambulatory Visit: Payer: Self-pay | Admitting: Internal Medicine

## 2023-04-29 DIAGNOSIS — I10 Essential (primary) hypertension: Secondary | ICD-10-CM

## 2023-05-01 ENCOUNTER — Encounter: Payer: Self-pay | Admitting: Internal Medicine

## 2023-05-01 LAB — LIPID PANEL
Chol/HDL Ratio: 3 {ratio} (ref 0.0–5.0)
Cholesterol, Total: 124 mg/dL (ref 100–199)
HDL: 41 mg/dL (ref >39)
LDL Chol Calc (NIH): 59 mg/dL (ref 0–99)
Triglycerides: 140 mg/dL (ref 0–149)
VLDL Cholesterol Cal: 24 mg/dL (ref 5–40)

## 2023-05-01 LAB — CMP14+EGFR
ALT: 50 [IU]/L — ABNORMAL HIGH (ref 0–44)
AST: 26 [IU]/L (ref 0–40)
Albumin: 4.5 g/dL (ref 3.8–4.9)
Alkaline Phosphatase: 77 [IU]/L (ref 44–121)
BUN/Creatinine Ratio: 10 (ref 9–20)
BUN: 10 mg/dL (ref 6–24)
Bilirubin Total: 1.1 mg/dL (ref 0.0–1.2)
CO2: 24 mmol/L (ref 20–29)
Calcium: 10 mg/dL (ref 8.7–10.2)
Chloride: 104 mmol/L (ref 96–106)
Creatinine, Ser: 1.05 mg/dL (ref 0.76–1.27)
Globulin, Total: 2.3 g/dL (ref 1.5–4.5)
Glucose: 116 mg/dL — ABNORMAL HIGH (ref 70–99)
Potassium: 5 mmol/L (ref 3.5–5.2)
Sodium: 142 mmol/L (ref 134–144)
Total Protein: 6.8 g/dL (ref 6.0–8.5)
eGFR: 84 mL/min/{1.73_m2} (ref >59)

## 2023-05-01 LAB — HEMOGLOBIN A1C
Est. average glucose Bld gHb Est-mCnc: 134 mg/dL
Hgb A1c MFr Bld: 6.3 % — ABNORMAL HIGH (ref 4.8–5.6)

## 2023-05-02 ENCOUNTER — Telehealth (INDEPENDENT_AMBULATORY_CARE_PROVIDER_SITE_OTHER): Payer: Managed Care, Other (non HMO) | Admitting: Internal Medicine

## 2023-05-02 ENCOUNTER — Encounter: Payer: Self-pay | Admitting: Internal Medicine

## 2023-05-02 ENCOUNTER — Ambulatory Visit: Payer: Managed Care, Other (non HMO) | Admitting: Internal Medicine

## 2023-05-02 VITALS — Ht 68.0 in

## 2023-05-02 DIAGNOSIS — I10 Essential (primary) hypertension: Secondary | ICD-10-CM | POA: Diagnosis not present

## 2023-05-02 DIAGNOSIS — Z6836 Body mass index (BMI) 36.0-36.9, adult: Secondary | ICD-10-CM

## 2023-05-02 DIAGNOSIS — E782 Mixed hyperlipidemia: Secondary | ICD-10-CM

## 2023-05-02 DIAGNOSIS — R7303 Prediabetes: Secondary | ICD-10-CM | POA: Diagnosis not present

## 2023-05-02 DIAGNOSIS — E66812 Obesity, class 2: Secondary | ICD-10-CM

## 2023-05-02 MED ORDER — OZEMPIC (0.25 OR 0.5 MG/DOSE) 2 MG/3ML ~~LOC~~ SOPN: 0.2500 mg | PEN_INJECTOR | SUBCUTANEOUS | 0 refills | Status: DC

## 2023-05-02 NOTE — Assessment & Plan Note (Signed)
 Amlodipine-olmesartan 5-20 mg daily was started at his last appointment.  Olmesartan discontinued.  Home BP readings have improved.  No additional changes are indicated today.

## 2023-05-02 NOTE — Assessment & Plan Note (Signed)
 Atorvastatin 20 mg daily was started at his last appointment in the setting of increasing LDL and intermediate ASCVD risk.  Lipid panel earlier this week reflects significant improvement.  No further changes are indicated at this time.

## 2023-05-02 NOTE — Progress Notes (Signed)
 Virtual Visit via Video Note  I connected with Troy Franklin on 05/02/23 at  1:40 PM EST by a video enabled telemedicine application and verified that I am speaking with the correct person using two identifiers.  Patient Location: Home Provider Location: Home Office  I discussed the limitations, risks, security, and privacy concerns of performing an evaluation and management service by video and the availability of in person appointments. I also discussed with the patient that there may be a patient responsible charge related to this service. The patient expressed understanding and agreed to proceed.  Subjective: PCP: Billie Lade, MD  Chief Complaint  Patient presents with   Care Management    F/u .   Mr. Sonn has been seen today through video encounter for routine follow-up.  Last evaluated by me in November 2024 for his annual exam.  His blood pressure remained elevated at that time.  Olmesartan was discontinued in favor of amlodipine-olmesartan 5-20 mg daily.  Atorvastatin 20 mg daily was also initiated for improved treatment of hyperlipidemia.  Lastly, Reginal Lutes was prescribed in the setting of obesity with prediabetes, though ultimately not covered by insurance and metformin was started.  81-month follow-up was arranged for reassessment.  In the interim, he was evaluated at [facility] for an acute visit on 1/28 endorsing URI symptoms.  There have otherwise been no acute interval events.  Mr. Boulet reports feeling well today.  He is asymptomatic and has no acute concerns to discuss.  ROS: Per HPI  Current Outpatient Medications:    acetaminophen (TYLENOL) 500 MG tablet, Take 1,000 mg by mouth every 6 (six) hours as needed for moderate pain or mild pain., Disp: , Rfl:    amLODipine-olmesartan (AZOR) 5-20 MG tablet, Take 1 tablet by mouth once daily, Disp: 90 tablet, Rfl: 0   atorvastatin (LIPITOR) 20 MG tablet, Take 1 tablet (20 mg total) by mouth daily., Disp: 90 tablet, Rfl: 3    metFORMIN (GLUCOPHAGE-XR) 500 MG 24 hr tablet, Take 1 tablet (500 mg total) by mouth 2 (two) times daily with a meal., Disp: 180 tablet, Rfl: 1   Naproxen Sodium (ALEVE PO), Take by mouth. Once a day, Disp: , Rfl:    Semaglutide,0.25 or 0.5MG /DOS, (OZEMPIC, 0.25 OR 0.5 MG/DOSE,) 2 MG/3ML SOPN, Inject 0.25 mg into the skin once a week., Disp: 3 mL, Rfl: 0  Observations/Objective: Today's Vitals   05/02/23 1325  Height: 5\' 8"  (1.727 m)   Assessment and Plan:  Primary hypertension Assessment & Plan: Amlodipine-olmesartan 5-20 mg daily was started at his last appointment.  Olmesartan discontinued.  Home BP readings have improved.  No additional changes are indicated today.  Mixed hyperlipidemia Assessment & Plan: Atorvastatin 20 mg daily was started at his last appointment in the setting of increasing LDL and intermediate ASCVD risk.  Lipid panel earlier this week reflects significant improvement.  No further changes are indicated at this time.  Prediabetes Assessment & Plan: A1c has increased to 6.3 from 6.2 previously.  Metformin XR 500 mg twice daily was started in early December.  He is frustrated by the lack of improvement despite adhering to a healthy diet. -Treatment options reviewed.  Ozempic 0.25 mg weekly has been prescribed today.  If not covered by insurance, we will plan to further increase metformin XR to 1000 mg twice daily.  Class 2 severe obesity with serious comorbidity and body mass index (BMI) of 36.0 to 36.9 in adult, unspecified obesity type [facility]) Assessment & Plan: His most recent  weight was 244 pounds.  He weighed 242 pounds at his last appointment.  BMI 38.  Reginal Lutes was prescribed at his last appointment but not covered by insurance. -Treatment options reviewed.  Lifestyle modifications aimed at weight loss were reinforced today.  Will also prescribed Ozempic 0.25 mg weekly in the setting of obesity and prediabetes.  Follow-up in 3 months for reassessment.  Follow Up  Instructions: Return in about 3 months (around 07/30/2023).   I discussed the assessment and treatment plan with the patient. The patient was provided an opportunity to ask questions, and all were answered. The patient agreed with the plan and demonstrated an understanding of the instructions.   The patient was advised to call back or seek an in-person evaluation if the symptoms worsen or if the condition fails to improve as anticipated.  The above assessment and management plan was discussed with the patient. The patient verbalized understanding of and has agreed to the management plan.   Billie Lade, MD

## 2023-05-02 NOTE — Assessment & Plan Note (Signed)
 His most recent weight was 244 pounds.  He weighed 242 pounds at his last appointment.  BMI 38.  Troy Franklin was prescribed at his last appointment but not covered by insurance. -Treatment options reviewed.  Lifestyle modifications aimed at weight loss were reinforced today.  Will also prescribed Ozempic 0.25 mg weekly in the setting of obesity and prediabetes.  Follow-up in 3 months for reassessment.

## 2023-05-02 NOTE — Assessment & Plan Note (Signed)
 A1c has increased to 6.3 from 6.2 previously.  Metformin XR 500 mg twice daily was started in early December.  He is frustrated by the lack of improvement despite adhering to a healthy diet. -Treatment options reviewed.  Ozempic 0.25 mg weekly has been prescribed today.  If not covered by insurance, we will plan to further increase metformin XR to 1000 mg twice daily.

## 2023-07-26 ENCOUNTER — Other Ambulatory Visit: Payer: Self-pay | Admitting: Internal Medicine

## 2023-07-26 DIAGNOSIS — I10 Essential (primary) hypertension: Secondary | ICD-10-CM

## 2023-08-02 ENCOUNTER — Ambulatory Visit: Payer: Managed Care, Other (non HMO) | Admitting: Internal Medicine

## 2023-08-02 ENCOUNTER — Encounter: Payer: Self-pay | Admitting: Internal Medicine

## 2023-08-02 VITALS — BP 128/75 | HR 78 | Ht 67.0 in | Wt 245.8 lb

## 2023-08-02 DIAGNOSIS — E669 Obesity, unspecified: Secondary | ICD-10-CM | POA: Diagnosis not present

## 2023-08-02 DIAGNOSIS — I10 Essential (primary) hypertension: Secondary | ICD-10-CM | POA: Diagnosis not present

## 2023-08-02 DIAGNOSIS — Z6836 Body mass index (BMI) 36.0-36.9, adult: Secondary | ICD-10-CM

## 2023-08-02 DIAGNOSIS — G4733 Obstructive sleep apnea (adult) (pediatric): Secondary | ICD-10-CM

## 2023-08-02 DIAGNOSIS — R7303 Prediabetes: Secondary | ICD-10-CM

## 2023-08-02 DIAGNOSIS — E66812 Obesity, class 2: Secondary | ICD-10-CM

## 2023-08-02 MED ORDER — ZEPBOUND 2.5 MG/0.5ML ~~LOC~~ SOAJ: 2.5000 mg | SUBCUTANEOUS | 0 refills | Status: DC

## 2023-08-02 NOTE — Patient Instructions (Signed)
 It was a pleasure to see you today.  Thank you for giving us  the opportunity to be involved in your care.  Below is a brief recap of your visit and next steps.  We will plan to see you again in 3 months.  Summary Start Zepbound for weight loss I recommend using fluticasone nasal spray for sinus congestion No additional medication changes Follow up in 3 months

## 2023-08-02 NOTE — Assessment & Plan Note (Signed)
 Remains adequately controlled on current antihypertensive regimen.  No medication changes are indicated today.

## 2023-08-02 NOTE — Assessment & Plan Note (Signed)
 Current weight 245 pounds.  BMI 38.5.  Obesity is complicated by prediabetes, hypertension, and severe obstructive sleep apnea.  He has made significant dietary changes in an effort to lose weight and attempts to exercise regularly.  He remains interested in medication options for weight loss. -Through shared decision making, Zepbound  2.5 mg weekly has been prescribed today.  Will plan to increase to 5 mg weekly upon completion of 4 injections.  Lifestyle modifications aimed at weight loss were reinforced today.  Follow-up in 3 months for weight management.

## 2023-08-02 NOTE — Progress Notes (Signed)
 Established Patient Office Visit  Subjective   Patient ID: Troy Franklin, male    DOB: 1970-10-15  Age: 53 y.o. MRN: [id number]  Chief Complaint  Patient presents with   Hypertension    Three month follow up    Mr. Troy Franklin returns to care today for routine follow-up.  He was last evaluated by me for follow-up through video encounter on 2/19.  Ozempic  was prescribed at that time in the setting of obesity and prediabetes.  No additional medication changes were made and 37-month follow-up was arranged.  There have been no acute interval events.  Mr. Troy Franklin reports feeling well today.  He is asymptomatic and has no acute concerns to discuss.  Insurance did not cover Ozempic .  He continues to take his additional medications as previously prescribed.  Past Medical History:  Diagnosis Date   Arthritis    lumbar   Complication of anesthesia    difficulty waking up- sleepiness lasted longer than other times.    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   Hypertension    PONV (postoperative nausea and vomiting)    Sleep apnea    Cpap   Spondylolisthesis of lumbar region    Past Surgical History:  Procedure Laterality Date   ANKLE FRACTURE SURGERY     right - pins and screws   BACK SURGERY     fusion 2014, laminectomy 2015, laminectomy and fusion 2019, fusion 2021   BIOPSY  02/09/2021   Procedure: BIOPSY;  Surgeon: Urban Garden, MD;  Location: [facility];  Service: Gastroenterology;;   CATARACT EXTRACTION Right 03/29/2022   CATARACT EXTRACTION Left 04/2022   CERVICAL FUSION  2013   CERVICAL FUSION     2007 & 2013 & March 2022   COLONOSCOPY WITH PROPOFOL  N/A 02/09/2021   Procedure: COLONOSCOPY WITH PROPOFOL ;  Surgeon: Urban Garden, MD;  Location: [facility];  Service: Gastroenterology;  Laterality: N/A;  7:30   HERNIA REPAIR  1974   hydrocele   KNEE LIGAMENT RECONSTRUCTION     right 1987   LUMBAR  LAMINECTOMY/DECOMPRESSION MICRODISCECTOMY N/A 10/10/2017   Procedure: Lumbar Three-Four Laminectomy;  Surgeon: Audie Bleacher, MD;  Location: [facility] OR;  Service: Neurosurgery;  Laterality: N/A;   POLYPECTOMY  02/09/2021   Procedure: POLYPECTOMY;  Surgeon: Umberto Ganong, Bearl Limes, MD;  Location: [facility];  Service: Gastroenterology;;   VASECTOMY     Social History   Tobacco Use   Smoking status: Former    Current packs/day: 0.00    Average packs/day: 0.3 packs/day for 4.0 years (1.0 ttl pk-yrs)    Types: Cigarettes    Start date: 12/16/2014    Quit date: 12/16/2018    Years since quitting: 4.6   Smokeless tobacco: Never  Vaping Use   Vaping status: Never Used  Substance Use Topics   Alcohol use: Not Currently    Comment: socially- x1/month   Drug use: No   Family History  Problem Relation Age of Onset   Colon cancer Neg Hx    Allergies  Allergen Reactions   Onion Other (See Comments)    Migraines    Review of Systems  Constitutional:  Negative for chills and fever.  HENT:  Negative for sore throat.   Respiratory:  Negative for cough and shortness of breath.   Cardiovascular:  Negative for chest pain, palpitations and leg swelling.  Gastrointestinal:  Negative for abdominal pain, blood in stool, constipation, diarrhea, nausea and  vomiting.  Genitourinary:  Negative for dysuria and hematuria.  Musculoskeletal:  Negative for myalgias.  Skin:  Negative for itching and rash.  Neurological:  Negative for dizziness and headaches.  Psychiatric/Behavioral:  Negative for depression and suicidal ideas.      Objective:     BP 128/75   Pulse 78   Ht 5\' 7"  (1.702 m)   Wt 245 lb 12.8 oz (111.5 kg)   SpO2 95%   BMI 38.50 kg/m  BP Readings from Last 3 Encounters:  08/02/23 128/75  04/10/23 135/82  01/30/23 (!) 152/79    Physical Exam Vitals reviewed.  Constitutional:      General: He is not in acute distress.    Appearance: Normal appearance. He is obese. He is not  ill-appearing.  HENT:     Head: Normocephalic and atraumatic.     Right Ear: External ear normal.     Left Ear: External ear normal.     Nose: Nose normal. No congestion or rhinorrhea.     Mouth/Throat:     Mouth: Mucous membranes are moist.     Pharynx: Oropharynx is clear.  Eyes:     General: No scleral icterus.    Extraocular Movements: Extraocular movements intact.     Conjunctiva/sclera: Conjunctivae normal.     Pupils: Pupils are equal, round, and reactive to light.  Cardiovascular:     Rate and Rhythm: Normal rate and regular rhythm.     Pulses: Normal pulses.     Heart sounds: Normal heart sounds. No murmur heard. Pulmonary:     Effort: Pulmonary effort is normal.     Breath sounds: Normal breath sounds. No wheezing, rhonchi or rales.  Abdominal:     General: Abdomen is flat. Bowel sounds are normal. There is no distension.     Palpations: Abdomen is soft.     Tenderness: There is no abdominal tenderness.  Musculoskeletal:        General: No swelling or deformity. Normal range of motion.     Cervical back: Normal range of motion.  Skin:    General: Skin is warm and dry.     Capillary Refill: Capillary refill takes less than 2 seconds.  Neurological:     General: No focal deficit present.     Mental Status: He is alert and oriented to person, place, and time.     Motor: No weakness.  Psychiatric:        Mood and Affect: Mood normal.        Behavior: Behavior normal.        Thought Content: Thought content normal.   Last CBC Lab Results  Component Value Date   WBC 5.7 01/24/2023   HGB 16.0 01/24/2023   HCT 48.1 01/24/2023   MCV 93 01/24/2023   MCH 30.9 01/24/2023   RDW 11.9 01/24/2023   PLT 171 01/24/2023   Last metabolic panel Lab Results  Component Value Date   GLUCOSE 116 (H) 04/30/2023   NA 142 04/30/2023   K 5.0 04/30/2023   CL 104 04/30/2023   CO2 24 04/30/2023   BUN 10 04/30/2023   CREATININE 1.05 04/30/2023   EGFR 84 04/30/2023   CALCIUM   10.0 04/30/2023   PROT 6.8 04/30/2023   ALBUMIN  4.5 04/30/2023   LABGLOB 2.3 04/30/2023   AGRATIO 1.9 04/05/2022   BILITOT 1.1 04/30/2023   ALKPHOS 77 04/30/2023   AST 26 04/30/2023   ALT 50 (H) 04/30/2023   ANIONGAP 7 02/07/2021   Last lipids  Lab Results  Component Value Date   CHOL 124 04/30/2023   HDL 41 04/30/2023   LDLCALC 59 04/30/2023   TRIG 140 04/30/2023   CHOLHDL 3.0 04/30/2023   Last hemoglobin A1c Lab Results  Component Value Date   HGBA1C 6.3 (H) 04/30/2023   Last thyroid functions Lab Results  Component Value Date   TSH 2.180 01/24/2023   Last vitamin D  Lab Results  Component Value Date   VD25OH 29.3 (L) 01/24/2023   Last vitamin B12 and Folate Lab Results  Component Value Date   VITAMINB12 523 01/24/2023   FOLATE 6.9 01/24/2023     Assessment & Plan:   Problem List Items Addressed This Visit       HTN (hypertension)   Remains adequately controlled on current antihypertensive regimen.  No medication changes are indicated today.      Sleep apnea   Severe OSA with AHI 36/h noted on PSG from November 2015.  He endorses nightly compliance with CPAP.  As otherwise documented, Zepbound has been prescribed today in the setting of obesity, severe OSA, and prediabetes.      Obesity   Current weight 245 pounds.  BMI 38.5.  Obesity is complicated by prediabetes, hypertension, and severe obstructive sleep apnea.  He has made significant dietary changes in an effort to lose weight and attempts to exercise regularly.  He remains interested in medication options for weight loss. -Through shared decision making, Zepbound 2.5 mg weekly has been prescribed today.  Will plan to increase to 5 mg weekly upon completion of 4 injections.  Lifestyle modifications aimed at weight loss were reinforced today.  Follow-up in 3 months for weight management.      Prediabetes   A1c 6.3 on labs from February.  He continues to take metformin  XR 500 mg twice daily.  Zepbound  has been prescribed today in the setting of obesity, severe OSA, and prediabetes.  Continue metformin  as currently prescribed.      Return in about 3 months (around 11/02/2023).   Tobi Fortes, MD

## 2023-08-02 NOTE — Assessment & Plan Note (Signed)
 A1c 6.3 on labs from February.  He continues to take metformin  XR 500 mg twice daily.  Zepbound  has been prescribed today in the setting of obesity, severe OSA, and prediabetes.  Continue metformin  as currently prescribed.

## 2023-08-02 NOTE — Assessment & Plan Note (Signed)
 Severe OSA with AHI 36/h noted on PSG from November 2015.  He endorses nightly compliance with CPAP.  As otherwise documented, Zepbound has been prescribed today in the setting of obesity, severe OSA, and prediabetes.

## 2023-08-03 ENCOUNTER — Telehealth: Payer: Self-pay | Admitting: Pharmacy Technician

## 2023-08-03 ENCOUNTER — Other Ambulatory Visit (HOSPITAL_COMMUNITY): Payer: Self-pay

## 2023-08-03 NOTE — Telephone Encounter (Signed)
 Pharmacy Patient Advocate Encounter   Received notification from Onbase that prior authorization for Zepbound 2.5MG /0.5ML pen-injectors is required/requested.   Insurance verification completed.   The patient is insured through [employer] .     Contacted patient's insurance to see if it would be covered though a PA for OSA and was advised that this drug is a total plan exclusion and would not be covered for any indication.

## 2023-08-08 ENCOUNTER — Other Ambulatory Visit: Payer: Self-pay | Admitting: Internal Medicine

## 2023-08-08 DIAGNOSIS — R7303 Prediabetes: Secondary | ICD-10-CM

## 2023-08-10 ENCOUNTER — Encounter: Payer: Self-pay | Admitting: Internal Medicine

## 2023-08-10 DIAGNOSIS — E66812 Obesity, class 2: Secondary | ICD-10-CM

## 2023-08-10 DIAGNOSIS — R7303 Prediabetes: Secondary | ICD-10-CM

## 2023-08-10 MED ORDER — PHENTERMINE HCL 15 MG PO CAPS
15.0000 mg | ORAL_CAPSULE | ORAL | 0 refills | Status: DC
Start: 2023-08-10 — End: 2023-09-12

## 2023-09-12 ENCOUNTER — Other Ambulatory Visit: Payer: Self-pay | Admitting: Internal Medicine

## 2023-09-12 DIAGNOSIS — R7303 Prediabetes: Secondary | ICD-10-CM

## 2023-10-15 ENCOUNTER — Other Ambulatory Visit: Payer: Self-pay

## 2023-10-15 DIAGNOSIS — R7303 Prediabetes: Secondary | ICD-10-CM

## 2023-10-15 DIAGNOSIS — E66812 Obesity, class 2: Secondary | ICD-10-CM

## 2023-10-24 ENCOUNTER — Other Ambulatory Visit: Payer: Self-pay | Admitting: Internal Medicine

## 2023-10-24 DIAGNOSIS — I10 Essential (primary) hypertension: Secondary | ICD-10-CM

## 2023-11-02 ENCOUNTER — Ambulatory Visit (HOSPITAL_COMMUNITY)
Admission: RE | Admit: 2023-11-02 | Discharge: 2023-11-02 | Disposition: A | Discharge status: Home / Self Care | Source: Ambulatory Visit

## 2023-11-02 ENCOUNTER — Ambulatory Visit

## 2023-11-02 VITALS — BP 129/82 | HR 76 | Resp 16 | Ht 67.0 in | Wt 225.1 lb

## 2023-11-02 DIAGNOSIS — M25551 Pain in right hip: Secondary | ICD-10-CM | POA: Diagnosis present

## 2023-11-02 DIAGNOSIS — E559 Vitamin D deficiency, unspecified: Secondary | ICD-10-CM

## 2023-11-02 DIAGNOSIS — E669 Obesity, unspecified: Secondary | ICD-10-CM | POA: Diagnosis not present

## 2023-11-02 DIAGNOSIS — I10 Essential (primary) hypertension: Secondary | ICD-10-CM | POA: Diagnosis not present

## 2023-11-02 DIAGNOSIS — R7303 Prediabetes: Secondary | ICD-10-CM

## 2023-11-02 MED ORDER — METHYLPREDNISOLONE 4 MG PO TBPK: ORAL_TABLET | ORAL | 0 refills | Status: DC

## 2023-11-02 MED ORDER — METFORMIN HCL ER 500 MG PO TB24
500.0000 mg | ORAL_TABLET | Freq: Two times a day (BID) | ORAL | 1 refills | Status: AC
Start: 2023-11-02 — End: ?

## 2023-11-02 NOTE — Progress Notes (Signed)
 Established Patient Office Visit  Subjective   Patient ID: Troy Franklin, male    DOB: 1971/02/04  Age: 53 y.o. MRN: [id number]  Chief Complaint  Patient presents with   Hypertension    3 month follow up    Hip Pain    Pt complains of right hip pain for last couples months. Pain is constant and getting worse. No known injury     Hypertension  Hip Pain   Discussed the use of AI scribe software for clinical note transcription with the patient, who gave verbal consent to proceed.  History of Present Illness   Troy Franklin is a 53 year old male who presents with right hip pain.  Right hip pain - Onset over the past couple of weeks, developed over a one-week period - Constant and severe pain causing limping - Associated with audible grinding, clicking, and popping sounds during ambulation, also heard by his wife - No known trauma or injury to the hip - Difficulty with activities of daily living, including putting on shoes - Alternating use of Aleve, ibuprofen, and Tylenol  for pain management  Spinal history - History of eight spinal fusions in the lower back and cervical spine - No current back pain or issues; back is stable  Weight loss efforts - Intentional weight loss of 20-25 pounds over the summer - Increased walking as primary method for weight loss - No use of Zepbound  due to insurance coverage issues - Weight loss achieved through lifestyle modifications  Medication use and tolerability - Currently taking metformin  for prediabetes - Currently taking a blood pressure medication, recently refilled - No gastrointestinal side effects from medications - No significant changes in bowel movements; less frequent bowel movements attributed to reduced food intake       Patient Active Problem List   Diagnosis Date Noted   Bronchitis 04/10/2023   Febrile illness 04/10/2023   Bilateral hearing loss 10/17/2022   Sore throat 04/17/2022   Skin lesions 04/17/2022    Vitamin D  insufficiency 04/17/2022   Encounter for well adult exam with abnormal findings 01/27/2022   Need for varicella vaccine 10/05/2021   Need for Tdap vaccination 10/05/2021   Obesity (BMI 30-39.9) 09/01/2021   Prediabetes 09/01/2021   Trigger finger, right index finger 09/01/2020   Hyperlipidemia 09/01/2020   Encounter for general adult medical examination with abnormal findings 08/13/2020   Screening due 08/13/2020   Sleep apnea 08/13/2020   HTN (hypertension) 08/13/2020   Lumbar stenosis with neurogenic claudication 10/10/2017   Spondylolisthesis of L4-5 04/13/2012   ROS    Objective:     BP 129/82   Pulse 76   Resp 16   Ht 5' 7 (1.702 m)   Wt 225 lb 1.3 oz (102.1 kg)   SpO2 94%   BMI 35.25 kg/m  BP Readings from Last 3 Encounters:  11/02/23 129/82  08/02/23 128/75  04/10/23 135/82   Wt Readings from Last 3 Encounters:  11/02/23 225 lb 1.3 oz (102.1 kg)  08/02/23 245 lb 12.8 oz (111.5 kg)  04/10/23 244 lb 1.9 oz (110.7 kg)      Physical Exam Vitals and nursing note reviewed.  Constitutional:      Appearance: Normal appearance.  HENT:     Head: Normocephalic.  Eyes:     Extraocular Movements: Extraocular movements intact.     Pupils: Pupils are equal, round, and reactive to light.  Cardiovascular:     Rate and Rhythm: Normal rate and regular rhythm.  Pulmonary:     Effort: Pulmonary effort is normal.     Breath sounds: Normal breath sounds.  Musculoskeletal:     Cervical back: Normal range of motion and neck supple.  Neurological:     Mental Status: He is alert and oriented to person, place, and time.  Psychiatric:        Mood and Affect: Mood normal.        Thought Content: Thought content normal.        Last CBC Lab Results  Component Value Date   WBC 5.7 01/24/2023   HGB 16.0 01/24/2023   HCT 48.1 01/24/2023   MCV 93 01/24/2023   MCH 30.9 01/24/2023   RDW 11.9 01/24/2023   PLT 171 01/24/2023   Last metabolic panel Lab  Results  Component Value Date   GLUCOSE 159 (H) 11/02/2023   NA 140 11/02/2023   K 4.0 11/02/2023   CL 102 11/02/2023   CO2 20 11/02/2023   BUN 12 11/02/2023   CREATININE 1.03 11/02/2023   EGFR 86 11/02/2023   CALCIUM  9.8 11/02/2023   PROT 6.8 11/02/2023   ALBUMIN  4.5 11/02/2023   LABGLOB 2.3 11/02/2023   AGRATIO 1.9 04/05/2022   BILITOT 1.1 11/02/2023   ALKPHOS 84 11/02/2023   AST 22 11/02/2023   ALT 30 11/02/2023   ANIONGAP 7 02/07/2021   Last lipids Lab Results  Component Value Date   CHOL 124 04/30/2023   HDL 41 04/30/2023   LDLCALC 59 04/30/2023   TRIG 140 04/30/2023   CHOLHDL 3.0 04/30/2023   Last hemoglobin A1c Lab Results  Component Value Date   HGBA1C 6.0 (H) 11/02/2023   Last thyroid functions Lab Results  Component Value Date   TSH 2.180 01/24/2023   Last vitamin D  Lab Results  Component Value Date   VD25OH 37.7 11/02/2023   Last vitamin B12 and Folate Lab Results  Component Value Date   VITAMINB12 523 01/24/2023   FOLATE 6.9 01/24/2023      The ASCVD Risk score (Arnett DK, et al., 2019) failed to calculate for the following reasons:   The valid total cholesterol range is 130 to 320 mg/dL    Assessment & Plan:   Problem List Items Addressed This Visit       Cardiovascular and Mediastinum   HTN (hypertension)   Remains adequately controlled on current antihypertensive regimen.  No medication changes are indicated today.      Relevant Orders   CMP14+EGFR (Completed)     Other   Obesity (BMI 30-39.9)   Current weight 225 lbs is down from 245 lbs at previous office visit.  BMI 35.25.  Obesity is complicated by prediabetes, hypertension, and severe obstructive sleep apnea.  He has made significant dietary changes in an effort to lose weight and attempts to exercise regularly.  Previous rx for Zepbound  was denied by insurance.  Continue with dietary and lifestyle changes to achieve further weight loss.       Relevant Medications    metFORMIN  (GLUCOPHAGE -XR) 500 MG 24 hr tablet   Prediabetes - Primary   A1c 6.3 on labs from February.  He continues to take metformin  XR 500 mg twice daily.  Continue metformin  as currently prescribed. Recheck labs today.        Relevant Medications   metFORMIN  (GLUCOPHAGE -XR) 500 MG 24 hr tablet   Other Relevant Orders   CMP14+EGFR (Completed)   HgB A1c (Completed)   Vitamin D  insufficiency   Noted on recent labs.  He  is taking daily vitamin D  supplementation recommended.      Relevant Orders   Vitamin D  (25 hydroxy) (Completed)   Other Visit Diagnoses       Pain of right hip       Relevant Medications   methylPREDNISolone  (MEDROL  DOSEPAK) 4 MG TBPK tablet   Other Relevant Orders   DG HIP UNILAT WITH PELVIS 2-3 VIEWS RIGHT     Assessment and Plan    Right hip pain Right hip pain with grinding, clicking, and popping sounds, likely due to inflammation or structural issues. Distinct from back pain. - Order right hip x-ray. - Prescribe Medrol  dose pack for inflammation. - Advise alternating Aleve, ibuprofen, and Tylenol  for pain. - Instruct to obtain x-ray at convenience next week.  Prediabetes Prediabetes managed with metformin , stable condition. - Refill metformin  as needed.  Essential hypertension Blood pressure well-controlled, recent weight loss noted. - Refill blood pressure medication as needed.  Follow-Up Discussed follow-up for hip pain and lab work. - Advise follow-up with hip x-ray results. - Perform lab work today, no fasting required.        No follow-ups on file.    Leita Longs, FNP

## 2023-11-03 LAB — CMP14+EGFR
ALT: 30 IU/L (ref 0–44)
AST: 22 IU/L (ref 0–40)
Albumin: 4.5 g/dL (ref 3.8–4.9)
Alkaline Phosphatase: 84 IU/L (ref 44–121)
BUN/Creatinine Ratio: 12 (ref 9–20)
BUN: 12 mg/dL (ref 6–24)
Bilirubin Total: 1.1 mg/dL (ref 0.0–1.2)
CO2: 20 mmol/L (ref 20–29)
Calcium: 9.8 mg/dL (ref 8.7–10.2)
Chloride: 102 mmol/L (ref 96–106)
Creatinine, Ser: 1.03 mg/dL (ref 0.76–1.27)
Globulin, Total: 2.3 g/dL (ref 1.5–4.5)
Glucose: 159 mg/dL — ABNORMAL HIGH (ref 70–99)
Potassium: 4 mmol/L (ref 3.5–5.2)
Sodium: 140 mmol/L (ref 134–144)
Total Protein: 6.8 g/dL (ref 6.0–8.5)
eGFR: 86 mL/min/1.73 (ref >59)

## 2023-11-03 LAB — HEMOGLOBIN A1C
Est. average glucose Bld gHb Est-mCnc: 126 mg/dL
Hgb A1c MFr Bld: 6 % — ABNORMAL HIGH (ref 4.8–5.6)

## 2023-11-03 LAB — VITAMIN D 25 HYDROXY (VIT D DEFICIENCY, FRACTURES): Vit D, 25-Hydroxy: 37.7 ng/mL (ref 30.0–100.0)

## 2023-11-04 ENCOUNTER — Ambulatory Visit: Payer: Self-pay

## 2023-11-08 NOTE — Assessment & Plan Note (Signed)
 Remains adequately controlled on current antihypertensive regimen.  No medication changes are indicated today.

## 2023-11-08 NOTE — Assessment & Plan Note (Signed)
 Noted on recent labs.  He is taking daily vitamin D  supplementation recommended.

## 2023-11-08 NOTE — Assessment & Plan Note (Signed)
 A1c 6.3 on labs from February.  He continues to take metformin  XR 500 mg twice daily.  Continue metformin  as currently prescribed. Recheck labs today.

## 2023-11-08 NOTE — Assessment & Plan Note (Signed)
 Current weight 225 lbs is down from 245 lbs at previous office visit.  BMI 35.25.  Obesity is complicated by prediabetes, hypertension, and severe obstructive sleep apnea.  He has made significant dietary changes in an effort to lose weight and attempts to exercise regularly.  Previous rx for Zepbound  was denied by insurance.  Continue with dietary and lifestyle changes to achieve further weight loss.

## 2023-11-16 ENCOUNTER — Other Ambulatory Visit: Payer: Self-pay

## 2023-11-16 DIAGNOSIS — M25851 Other specified joint disorders, right hip: Secondary | ICD-10-CM

## 2023-11-18 ENCOUNTER — Other Ambulatory Visit: Payer: Self-pay

## 2023-11-18 DIAGNOSIS — R7303 Prediabetes: Secondary | ICD-10-CM

## 2023-11-18 DIAGNOSIS — E66812 Obesity, class 2: Secondary | ICD-10-CM

## 2023-12-05 ENCOUNTER — Ambulatory Visit: Admitting: Orthopedic Surgery

## 2023-12-05 ENCOUNTER — Encounter: Payer: Self-pay | Admitting: Orthopedic Surgery

## 2023-12-05 VITALS — BP 135/83 | HR 74 | Ht 67.0 in | Wt 222.0 lb

## 2023-12-05 DIAGNOSIS — M25351 Other instability, right hip: Secondary | ICD-10-CM | POA: Diagnosis not present

## 2023-12-05 DIAGNOSIS — M25551 Pain in right hip: Secondary | ICD-10-CM

## 2023-12-05 NOTE — Progress Notes (Signed)
 Orthopaedic Clinic Return  Assessment: Troy Franklin is a 53 y.o. male with the following: Right hip pain; signs of FAI on x-ray  Plan: Mr. Peace has pain in the anterior right hip and groin area.  Radiographs were reviewed in clinic today, which demonstrates evidence of impingement, including a cam lesion and some cysts in the lateral aspect of the acetabulum.  He has pain with FADIR maneuver.  Pain is progressively worsening.  Pain gets worse at work.  He has tried Tylenol , ibuprofen and steroids, with limited improvement in his symptoms.  Given the current presentation, and findings on x-ray, I am concerned about a labrum tear in the right hip.  As such, I have recommended an MRI.  Once the MRI is complete, we will meet in clinic to discuss the findings.  Follow-up: Return for After MRI.   Subjective:  Chief Complaint  Patient presents with   Hip Pain    Right- describes pain on side of hip that radiates to back of leg and knee denies pain in groin sounds like gravel in there    History of Present Illness: Troy Franklin is a 53 y.o. male who returns to clinic for evaluation of right hip pain.  He states he has pain in the right anterior hip, as well as into the groin, which is progressively worsening.  Pain started approximately 2 months ago.  He was evaluated by his PCP, and radiographs were obtained.  There was concern for FAI.  He has tried medication such as Tylenol , ibuprofen and a short course of steroids.  This has not provided any relief of his symptoms.  He has attempted some home exercises, without improvement in his symptoms.  Pain is getting worse.  Pain gets worse at work.  Review of Systems: No fevers or chills No numbness or tingling No chest pain No shortness of breath No bowel or bladder dysfunction No GI distress No headaches   Objective: BP 135/83   Pulse 74   Ht 5' 7 (1.702 m)   Wt 222 lb (100.7 kg)   BMI 34.77 kg/m   Physical Exam:  Alert and  oriented.  No acute distress.  Right sided antalgic gait  No tenderness to palpation over the anterior hip.  He has pain with internal rotation.  Pain with FADIR maneuver.  There is some popping with flexion.  He starts to have pain in the anterior hip with flexion beyond 70 degrees.  Toes are warm and well-perfused.  Sensation intact distally.  IMAGING: I personally ordered and reviewed the following images:   [name], MD 12/05/2023 11:54 AM

## 2023-12-14 ENCOUNTER — Encounter (INDEPENDENT_AMBULATORY_CARE_PROVIDER_SITE_OTHER): Payer: Self-pay | Admitting: *Deleted

## 2023-12-17 ENCOUNTER — Ambulatory Visit
Admission: RE | Admit: 2023-12-17 | Discharge: 2023-12-17 | Disposition: A | Discharge status: Home / Self Care | Source: Ambulatory Visit | Attending: Orthopedic Surgery | Admitting: Orthopedic Surgery

## 2023-12-17 DIAGNOSIS — M25551 Pain in right hip: Secondary | ICD-10-CM

## 2023-12-19 ENCOUNTER — Other Ambulatory Visit: Payer: Self-pay

## 2023-12-19 ENCOUNTER — Ambulatory Visit: Admitting: Orthopedic Surgery

## 2023-12-19 DIAGNOSIS — R7303 Prediabetes: Secondary | ICD-10-CM

## 2023-12-19 DIAGNOSIS — Z6836 Body mass index (BMI) 36.0-36.9, adult: Secondary | ICD-10-CM

## 2023-12-28 ENCOUNTER — Encounter: Payer: Self-pay | Admitting: Orthopedic Surgery

## 2023-12-28 ENCOUNTER — Ambulatory Visit: Admitting: Orthopedic Surgery

## 2023-12-28 DIAGNOSIS — M25551 Pain in right hip: Secondary | ICD-10-CM

## 2023-12-28 DIAGNOSIS — M1611 Unilateral primary osteoarthritis, right hip: Secondary | ICD-10-CM | POA: Diagnosis not present

## 2023-12-28 NOTE — Progress Notes (Signed)
 Orthopaedic Clinic Return  Assessment: Troy Franklin is a 53 y.o. male with the following: Right hip arthritis Increased signal within the femoral neck  Plan: [name] continues to have a lot of pain in the right hip.  MRI demonstrates increased signal in the femoral head as well as the neck, which could represent an insufficiency fracture.  This could be secondary to chronic impingement.  In addition, there are some areas of full-thickness cartilage loss.  Given the appearance on MRI, and his constellation of symptoms, we have discussed proceeding with total hip arthroplasty.  He states understanding.  He is interested in improvement of his hip, and will consider total hip arthroplasty.  He has some plans coming, including a trip after he graduates from college.  As such, he would like to delay consideration for surgery.  In the meantime, we will initiate physical therapy to see if we can get improved symptoms in the right hip.  He states understanding.  I would like to see him back in 3 months.  He will call the clinic if he is having issues, needs to be seen sooner.  Follow-up: Return in about 3 months (around 03/29/2024).   Subjective:  Chief Complaint  Patient presents with   Results    MRI review right hip-gotten worse since the MRI.    History of Present Illness: Troy Franklin is a 53 y.o. male who returns to clinic for evaluation of right hip pain.  He continues to have pain in the anterior aspect of the hip, radiating into the groin.  Pain has gotten worse since he obtained an MRI recently.  No specific injury.  He is having difficulty putting on his shoes and socks.  His activity has decreased.  He has a sedentary job, but finds it difficult to sit comfortably for long periods of time.  He describes the pain as a constant ache.   Review of Systems: No fevers or chills No numbness or tingling No chest pain No shortness of breath No bowel or bladder dysfunction No GI  distress No headaches   Objective: There were no vitals taken for this visit.  Physical Exam:  Alert and oriented.  No acute distress.  Right sided antalgic gait  No tenderness to palpation over the anterior hip.  He has pain with internal rotation.  Pain with FADIR maneuver.  There is some popping with flexion.  He starts to have pain in the anterior hip with flexion beyond 70 degrees.  Toes are warm and well-perfused.  Sensation intact distally.  IMAGING: I personally ordered and reviewed the following images:  Right hip MRI  IMPRESSION: 1. CAM morphology of the right femoral head and neck junction with pronounced marrow edema extending along the right superolateral femoral head and femoral neck, most pronounced posteriorly with findings suspicious for subchondral insufficiency fracture of the superior femoral head. There is suggestion of mild subchondral flattening without evidence of significant depression. 2. Prominent subchondral cyst/geode measuring up to 16 mm along the right anterosuperior acetabulum with surrounding marrow edema. High-grade loss of the subjacent articular cartilage with anterosuperior labral degeneration and tear. 3. Large field-of-view coronal images demonstrate a 10 x 9 mm well-circumscribed non-aggressive appearing lesion in the right sacral ala without significant surrounding marrow edema, favoring a benign etiology, possibly a subcortical cyst or interosseous ganglion.  Troy DELENA Horde, MD 12/28/2023 9:08 AM

## 2024-01-01 ENCOUNTER — Encounter: Payer: Self-pay | Admitting: Orthopedic Surgery

## 2024-01-03 ENCOUNTER — Telehealth: Payer: Self-pay

## 2024-01-03 ENCOUNTER — Encounter (HOSPITAL_COMMUNITY): Payer: Self-pay

## 2024-01-03 ENCOUNTER — Other Ambulatory Visit: Payer: Self-pay | Admitting: *Deleted

## 2024-01-03 ENCOUNTER — Encounter: Payer: Self-pay | Admitting: *Deleted

## 2024-01-03 ENCOUNTER — Ambulatory Visit (HOSPITAL_COMMUNITY): Attending: Orthopedic Surgery

## 2024-01-03 DIAGNOSIS — M25651 Stiffness of right hip, not elsewhere classified: Secondary | ICD-10-CM | POA: Diagnosis present

## 2024-01-03 DIAGNOSIS — R2689 Other abnormalities of gait and mobility: Secondary | ICD-10-CM | POA: Insufficient documentation

## 2024-01-03 DIAGNOSIS — M1611 Unilateral primary osteoarthritis, right hip: Secondary | ICD-10-CM | POA: Diagnosis not present

## 2024-01-03 DIAGNOSIS — M25551 Pain in right hip: Secondary | ICD-10-CM | POA: Diagnosis present

## 2024-01-03 MED ORDER — NA SULFATE-K SULFATE-MG SULF 17.5-3.13-1.6 GM/177ML PO SOLN: ORAL | 0 refills | Status: DC

## 2024-01-03 NOTE — Telephone Encounter (Signed)
 Thanks, Already answered in different encounter

## 2024-01-03 NOTE — Therapy (Signed)
 OUTPATIENT PHYSICAL THERAPY LOWER EXTREMITY EVALUATION   Patient Name: Troy Franklin MRN: [id number] DOB:[birth year], 53 y.o., male Today's Date: 01/03/2024  END OF SESSION:  PT End of Session - 01/03/24 1029     Visit Number 1    Number of Visits 8    Date for Recertification  03/07/24    Authorization Type [employer]    Authorization - Number of Visits 30    Progress Note Due on Visit 10    PT Start Time 1030    PT Stop Time 1117    PT Time Calculation (min) 47 min    Activity Tolerance Patient tolerated treatment well    Behavior During Therapy WFL for tasks assessed/performed          Past Medical History:  Diagnosis Date   Arthritis    lumbar   Complication of anesthesia    difficulty waking up- sleepiness lasted longer than other times.    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   Hypertension    PONV (postoperative nausea and vomiting)    Sleep apnea    Cpap   Spondylolisthesis of lumbar region    Past Surgical History:  Procedure Laterality Date   ANKLE FRACTURE SURGERY     right - pins and screws   BACK SURGERY     fusion 2014, laminectomy 2015, laminectomy and fusion 2019, fusion 2021   BIOPSY  02/09/2021   Procedure: BIOPSY;  Surgeon: Eartha Angelia Toribio, MD;  Location: [facility];  Service: Gastroenterology;;   CATARACT EXTRACTION Right 03/29/2022   CATARACT EXTRACTION Left 04/2022   CERVICAL FUSION  2013   CERVICAL FUSION     2007 & 2013 & March 2022   COLONOSCOPY WITH PROPOFOL  N/A 02/09/2021   Procedure: COLONOSCOPY WITH PROPOFOL ;  Surgeon: Eartha Angelia Toribio, MD;  Location: [facility];  Service: Gastroenterology;  Laterality: N/A;  7:30   FRACTURE SURGERY  2001   Right ankle/leg   HERNIA REPAIR  1974   hydrocele   KNEE LIGAMENT RECONSTRUCTION     right 1987   LUMBAR LAMINECTOMY/DECOMPRESSION MICRODISCECTOMY N/A 10/10/2017   Procedure: Lumbar Three-Four Laminectomy;  Surgeon:  Gillie Duncans, MD;  Location: [facility] OR;  Service: Neurosurgery;  Laterality: N/A;   POLYPECTOMY  02/09/2021   Procedure: POLYPECTOMY;  Surgeon: Eartha Angelia Toribio, MD;  Location: [facility];  Service: Gastroenterology;;   SPINE SURGERY     Multiple--8 so far   VASECTOMY     Patient Active Problem List   Diagnosis Date Noted   Febrile illness 04/10/2023   Bilateral hearing loss 10/17/2022   Skin lesions 04/17/2022   Encounter for well adult exam with abnormal findings 01/27/2022   Need for varicella vaccine 10/05/2021   Need for Tdap vaccination 10/05/2021   Obesity (BMI 30-39.9) 09/01/2021   Prediabetes 09/01/2021   Hyperlipidemia 09/01/2020   Encounter for general adult medical examination with abnormal findings 08/13/2020   Screening due 08/13/2020   Sleep apnea 08/13/2020   HTN (hypertension) 08/13/2020   Spondylolisthesis of L4-5 04/13/2012    PCP: Bevely Doffing, FNP  REFERRING PROVIDER: Onesimo Oneil LABOR, MD   REFERRING DIAG: Pain in right hip, Hip instability, right   THERAPY DIAG:  Pain in right hip  Stiffness of right hip, not elsewhere classified  Other abnormalities of gait and mobility  Rationale for Evaluation and Treatment: Rehabilitation  ONSET DATE: late July 2025  SUBJECTIVE:   SUBJECTIVE STATEMENT: Patient reports  that his right hip has been bothering for a while now. He is trying to put off surgery until January 2026. He did not have any problems with his hip until late July 2025 with no known cause. His pain is typically located in his right hip, but it can radiate around to his back and knee. His pain has been progressively getting worse as he can now hardly put his shoes and socks on. While walking, he feels it click, pop, and grind which will cause more pain. He feels that he is 80% limited with his normal daily activities.   PERTINENT HISTORY: HTN, hearing loss, and OA PAIN:  Are you having pain? Yes: NPRS scale: Current: 4/10 Best: 2/10  Worst: 10/10  Pain location: right hip Pain description: sharp, stabbing, and constant Aggravating factors: standing, weight bearing on R leg  Relieving factors: laying in bed with a pillow between his knees  PRECAUTIONS: None  RED FLAGS: None   WEIGHT BEARING RESTRICTIONS: No  FALLS:  Has patient fallen in last 6 months? 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  DIAGNOSTIC FINDINGS: 12/17/23 right hip MRI  IMPRESSION: 1. CAM morphology of the right femoral head and neck junction with pronounced marrow edema extending along the right superolateral femoral head and femoral neck, most pronounced posteriorly with findings suspicious for subchondral insufficiency fracture of the superior femoral head. There is suggestion of mild subchondral flattening without evidence of significant depression. 2. Prominent subchondral cyst/geode measuring up to 16 mm along the right anterosuperior acetabulum with surrounding marrow edema. High-grade loss of the subjacent articular cartilage with anterosuperior labral degeneration and tear. 3. Large field-of-view coronal images demonstrate a 10 x 9 mm well-circumscribed non-aggressive appearing lesion in the right sacral ala without significant surrounding marrow edema, favoring a benign etiology, possibly a subcortical cyst or interosseous ganglion.  PATIENT SURVEYS:  LEFS  Extreme difficulty/unable (0), Quite a bit of difficulty (1), Moderate difficulty (2), Little difficulty (3), No  difficulty (4) Survey date:  01/03/24  Any of your usual work, housework or school activities 1  2. Usual hobbies, recreational or sporting activities 0  3. Getting into/out of the bath 4  4. Walking between rooms 3  5. Putting on socks/shoes 0  6. Squatting  1  7. Lifting an object, like a bag of groceries from the floor 2  8. Performing light activities around your home 3  9. Performing heavy activities around your home 0  10. Getting into/out of a car 1  11. Walking 2 blocks 1  12. Walking 1 mile 0  13. Going up/down 10 stairs (1 flight) 1  14. Standing for 1 hour 0  15.  sitting for 1 hour 3  16. Running on even ground 0  17. Running on uneven ground 0  18. Making sharp turns while running fast 0  19. Hopping  0  20. Rolling over in bed 3  Score total:  23/80     COGNITION: Overall cognitive status: Within functional limits for tasks assessed     SENSATION:  Patient reports infrequent tingling that began in the last 10 days, but none currently.   POSTURE: weight shift left; used to avoid weight bearing on RLE  PALPATION: TTP: right hip flexors, TFL, and IT band  JOINT MOBILITY:  R hip: hypomobile and painful   LOWER EXTREMITY ROM:  Active ROM Right eval Left eval  Hip flexion 38; painful  106  Hip extension    Hip abduction    Hip adduction    Hip internal rotation    Hip external rotation    Knee flexion    Knee extension    Ankle dorsiflexion    Ankle plantarflexion    Ankle inversion    Ankle eversion     (Blank rows = not tested)  LOWER EXTREMITY MMT:  MMT Right eval Left eval  Hip flexion 3+/5; slight pain 4+/5  Hip extension    Hip abduction    Hip adduction 4/5; painful crepitus  4/5  Hip internal rotation    Hip external rotation    Knee flexion 4/5 5/5  Knee extension 4/5; familiar pain  5/5  Ankle dorsiflexion    Ankle plantarflexion    Ankle inversion    Ankle eversion     (Blank rows = not tested)  LOWER EXTREMITY SPECIAL  TESTS:  Unable to assess due to pain severity and irritability  FUNCTIONAL TESTS:  Unable to assess due to pain severity and irritability  GAIT: Assistive device utilized: None Level of assistance: Complete Independence Comments: antalgic gait pattern                                                                                                                                TREATMENT DATE:   01/03/24: PT evaluation, patient education, and HEP   PATIENT EDUCATION:  Education details: POC, prognosis, healing, anatomy, HEP, and goals for physical therapy Person educated: Patient Education method: Explanation, Demonstration, and Handouts Education comprehension: verbalized understanding and returned demonstration  HOME EXERCISE PROGRAM: Access Code: [email or web address] URL: [email or web address] Date: 01/03/2024 Prepared by: Lacinda Fass  Exercises - Supine Hip Adduction Isometric with Ball  - 1 x daily - 7 x weekly - 10 reps - 3-5 seconds hold - Hooklying Gluteal Sets  - 1 x daily - 7 x weekly - 2 sets - 10 reps - 3-5 seconds hold  ASSESSMENT:  CLINICAL IMPRESSION: Patient is a 53 y.o. male who was seen today for physical therapy evaluation and treatment for chronic right hip pain. He presented with moderate pain severity and irritability active and passive right hip range of motion being the most aggravating to his familiar symptoms. He was provided a HEP and he was able to properly demonstrate these interventions. He reported feeling comfortable with these interventions. Recommend that he continue with skilled physical therapy to address his remaining impairments to maximize his functional mobility.    OBJECTIVE IMPAIRMENTS: Abnormal gait, decreased activity tolerance, decreased balance, decreased mobility, difficulty walking, decreased  ROM, decreased strength, hypomobility, impaired tone, and pain.   ACTIVITY LIMITATIONS: lifting, standing, squatting, sleeping, stairs,  transfers, dressing, and locomotion level  PARTICIPATION LIMITATIONS: driving, shopping, community activity, and yard work  PERSONAL FACTORS: Past/current experiences, Time since onset of injury/illness/exacerbation, and 3+ comorbidities: HTN, hearing loss, and OA are also affecting patient's functional outcome.   REHAB POTENTIAL: Fair    CLINICAL DECISION MAKING: Evolving/moderate complexity  EVALUATION COMPLEXITY: Moderate   GOALS: Goals reviewed with patient? Yes  SHORT TERM GOALS: Target date: 01/24/24 Patient will be independent with his initial HEP.  Baseline: Goal status: INITIAL  2.  Patient will report being able to walk at least 100 feet without his familiar symptoms exceeding 8/10. Baseline:  Goal status: INITIAL  3.  Patient will improve his LEFS score to at least 35/80 or greater for improved perceived function with his daily activities.  Baseline:  Goal status: INITIAL  4.  Patient will report being able to sleep without being awakened by his familiar symptoms for at least 2 hours.  Baseline:  Goal status: INITIAL  LONG TERM GOALS: Target date: 02/14/24  Patient will be independent with his advanced HEP.  Baseline:  Goal status: INITIAL  2.  Patient will report being able to complete his daily activities without his familiar symptoms exceeding 6/10. Baseline:  Goal status: INITIAL  3.  Patient will improve his LEFS score to at least 45/80 for improved perceived function with his daily activities.  Baseline:  Goal status: INITIAL  4.  Patient will be able to navigate at least 4 steps with a reciprocal pattern for improved household mobility.  Baseline:  Goal status: INITIAL  PLAN:  PT FREQUENCY: 1-2x/week  PT DURATION: 6 weeks  PLANNED INTERVENTIONS: 02835- PT Re-evaluation, 97750- Physical Performance Testing, 97110-Therapeutic exercises, 97530- Therapeutic activity, [id number]- Neuromuscular re-education, 97535- Self Care, 02859- Manual therapy,  [phone]- Gait training, [phone]- Electrical stimulation (unattended), 20560 (1-2 muscles), 20561 (3+ muscles)- Dry Needling, Patient/Family education, Balance training, Stair training, Joint mobilization, Cryotherapy, and Moist heat  PLAN FOR NEXT SESSION: review and update HEP (as needed), isometrics, Nustep, lower extremity strengthening, and gait training   Lacinda JAYSON Fass, PT 01/03/2024, 1:12 PM

## 2024-01-03 NOTE — Telephone Encounter (Signed)
 Who is your primary care physician: Dr.Laura Huenink  Reasons for the colonoscopy: hx polyps  Have you had a colonoscopy before?  Yes 02-09-21  Do you have family history of colon cancer? no  Previous colonoscopy with polyps removed? yes  Do you have a history colorectal cancer?   no  Are you diabetic? If yes, Type 1 or Type 2?    no  Do you have a prosthetic or mechanical heart valve? no  Do you have a pacemaker/defibrillator?   no  Have you had endocarditis/atrial fibrillation? no  Have you had joint replacement within the last 12 months?  no  Do you tend to be constipated or have to use laxatives? no  Do you have any history of drugs or alchohol?  no  Do you use supplemental oxygen?  no  Have you had a stroke or heart attack within the last 6 months? no  Do you take weight loss medication?  no     Do you take any blood-thinning medications such as: (aspirin, warfarin, Plavix, Aggrenox)  no  If yes we need the name, milligram, dosage and who is prescribing doctor  Current Outpatient Medications on File Prior to Visit  Medication Sig Dispense Refill   atorvastatin  (LIPITOR) 20 MG tablet Take 1 tablet (20 mg total) by mouth daily. 90 tablet 3   metFORMIN  (GLUCOPHAGE -XR) 500 MG 24 hr tablet Take 1 tablet (500 mg total) by mouth 2 (two) times daily with a meal. 180 tablet 1   phentermine  15 MG capsule Take 1 capsule by mouth in the morning 30 capsule 0   No current facility-administered medications on file prior to visit.    Allergies  Allergen Reactions   Onion Other (See Comments)    Migraines      [facility]  Primary Insurance Name: [employer] [id number]  Best number where you can be reached: [phone]

## 2024-01-03 NOTE — Telephone Encounter (Signed)
 Any room, phentermine  per protocol Thanks

## 2024-01-03 NOTE — Telephone Encounter (Signed)
 Pt has been scheduled for 03/03/24. Instructions sent via mychart and prep sent to pharmacy.

## 2024-01-03 NOTE — Telephone Encounter (Signed)
 Questionnaire from recall, no referral needed

## 2024-01-03 NOTE — Telephone Encounter (Signed)
 Who is your primary care physician: Dr.Laura Huenink  Reasons for the colonoscopy: Hx polyps  Have you had a colonoscopy before?  Yes 02/09/21  Do you have family history of colon cancer? no  Previous colonoscopy with polyps removed? yes  Do you have a history colorectal cancer?   no  Are you diabetic? If yes, Type 1 or Type 2?    no  Do you have a prosthetic or mechanical heart valve? no  Do you have a pacemaker/defibrillator?   no  Have you had endocarditis/atrial fibrillation? no  Have you had joint replacement within the last 12 months?  no  Do you tend to be constipated or have to use laxatives? no  Do you have any history of drugs or alchohol?  no  Do you use supplemental oxygen?  no  Have you had a stroke or heart attack within the last 6 months? no  Do you take weight loss medication?  yes     Do you take any blood-thinning medications such as: (aspirin, warfarin, Plavix, Aggrenox)  no  If yes we need the name, milligram, dosage and who is prescribing doctor  Current Outpatient Medications on File Prior to Visit  Medication Sig Dispense Refill   amLODipine -olmesartan  (AZOR ) 5-20 MG tablet Take 1 tablet by mouth daily.     atorvastatin  (LIPITOR) 20 MG tablet Take 1 tablet (20 mg total) by mouth daily. 90 tablet 3   metFORMIN  (GLUCOPHAGE -XR) 500 MG 24 hr tablet Take 1 tablet (500 mg total) by mouth 2 (two) times daily with a meal. 180 tablet 1   phentermine  15 MG capsule Take 1 capsule by mouth in the morning 30 capsule 0   No current facility-administered medications on file prior to visit.    Allergies  Allergen Reactions   Onion Other (See Comments)    Migraines      [facility]  Primary Insurance Name: [employer] [id number]  Best number where you can be reached: [phone]

## 2024-01-04 ENCOUNTER — Encounter: Payer: Self-pay | Admitting: Orthopedic Surgery

## 2024-01-08 ENCOUNTER — Ambulatory Visit (HOSPITAL_COMMUNITY)

## 2024-01-08 DIAGNOSIS — M25551 Pain in right hip: Secondary | ICD-10-CM | POA: Diagnosis not present

## 2024-01-08 DIAGNOSIS — M25651 Stiffness of right hip, not elsewhere classified: Secondary | ICD-10-CM

## 2024-01-08 DIAGNOSIS — R2689 Other abnormalities of gait and mobility: Secondary | ICD-10-CM

## 2024-01-08 NOTE — Therapy (Signed)
 OUTPATIENT PHYSICAL THERAPY LOWER EXTREMITY TREATMENT   Patient Name: Troy Franklin MRN: [id number] DOB:02/15/1971, 53 y.o., male Today's Date: 01/08/2024  END OF SESSION:  PT End of Session - 01/08/24 1333     Visit Number 2    Number of Visits 8    Date for Recertification  03/07/24    Authorization Type [employer]    Authorization - Number of Visits 30    Progress Note Due on Visit 10    PT Start Time 1334    PT Stop Time 1414    PT Time Calculation (min) 40 min    Activity Tolerance Patient tolerated treatment well    Behavior During Therapy WFL for tasks assessed/performed          Past Medical History:  Diagnosis Date   Arthritis    lumbar   Complication of anesthesia    difficulty waking up- sleepiness lasted longer than other times.    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   Hypertension    PONV (postoperative nausea and vomiting)    Sleep apnea    Cpap   Spondylolisthesis of lumbar region    Past Surgical History:  Procedure Laterality Date   ANKLE FRACTURE SURGERY     right - pins and screws   BACK SURGERY     fusion 2014, laminectomy 2015, laminectomy and fusion 2019, fusion 2021   BIOPSY  02/09/2021   Procedure: BIOPSY;  Surgeon: Eartha Angelia Toribio, MD;  Location: [facility];  Service: Gastroenterology;;   CATARACT EXTRACTION Right 03/29/2022   CATARACT EXTRACTION Left 04/2022   CERVICAL FUSION  2013   CERVICAL FUSION     2007 & 2013 & March 2022   COLONOSCOPY WITH PROPOFOL  N/A 02/09/2021   Procedure: COLONOSCOPY WITH PROPOFOL ;  Surgeon: Eartha Angelia Toribio, MD;  Location: [facility];  Service: Gastroenterology;  Laterality: N/A;  7:30   FRACTURE SURGERY  2001   Right ankle/leg   HERNIA REPAIR  1974   hydrocele   KNEE LIGAMENT RECONSTRUCTION     right 1987   LUMBAR LAMINECTOMY/DECOMPRESSION MICRODISCECTOMY N/A 10/10/2017   Procedure: Lumbar Three-Four Laminectomy;  Surgeon:  Gillie Duncans, MD;  Location: [facility] OR;  Service: Neurosurgery;  Laterality: N/A;   POLYPECTOMY  02/09/2021   Procedure: POLYPECTOMY;  Surgeon: Eartha Angelia Toribio, MD;  Location: [facility];  Service: Gastroenterology;;   SPINE SURGERY     Multiple--8 so far   VASECTOMY     Patient Active Problem List   Diagnosis Date Noted   Febrile illness 04/10/2023   Bilateral hearing loss 10/17/2022   Skin lesions 04/17/2022   Encounter for well adult exam with abnormal findings 01/27/2022   Need for varicella vaccine 10/05/2021   Need for Tdap vaccination 10/05/2021   Obesity (BMI 30-39.9) 09/01/2021   Prediabetes 09/01/2021   Hyperlipidemia 09/01/2020   Encounter for general adult medical examination with abnormal findings 08/13/2020   Screening due 08/13/2020   Sleep apnea 08/13/2020   HTN (hypertension) 08/13/2020   Spondylolisthesis of L4-5 04/13/2012    PCP: Bevely Doffing, FNP  REFERRING PROVIDER: Onesimo Oneil LABOR, MD   REFERRING DIAG: Pain in right hip, Hip instability, right   THERAPY DIAG:  Pain in right hip  Stiffness of right hip, not elsewhere classified  Other abnormalities of gait and mobility  Rationale for Evaluation and Treatment: Rehabilitation  ONSET DATE: late July 2025  SUBJECTIVE:   SUBJECTIVE STATEMENT: Just trying  to make it through the year; looking forward to having a THA would like to have at the end of the year. Graduating college 02/22/24; has been standing about 3 hours today and is hurting quite a bit up to a 9.78/10.     EVAL:Patient reports that his right hip has been bothering for a while now. He is trying to put off surgery until January 2026. He did not have any problems with his hip until late July 2025 with no known cause. His pain is typically located in his right hip, but it can radiate around to his back and knee. His pain has been progressively getting worse as he can now hardly put his shoes and socks on. While walking, he feels  it click, pop, and grind which will cause more pain. He feels that he is 80% limited with his normal daily activities.   PERTINENT HISTORY: HTN, hearing loss, and OA PAIN:  Are you having pain? Yes: NPRS scale: Current: 4/10 Best: 2/10 Worst: 10/10  Pain location: right hip Pain description: sharp, stabbing, and constant Aggravating factors: standing, weight bearing on R leg  Relieving factors: laying in bed with a pillow between his knees  PRECAUTIONS: None  RED FLAGS: None   WEIGHT BEARING RESTRICTIONS: No  FALLS:  Has patient fallen in last 6 months? No  LIVING ENVIRONMENT: Lives with: lives with their spouse Lives in: House/apartment Stairs: Yes: External: 2 steps; none; step to pattern leading with LLE Has following equipment at home: None  OCCUPATION: production designer, theatre/television/film of concrete [employer]   PLOF: Independent  PATIENT GOALS: reduced pain, walk 100 feet without severe pain, and improved sleep (only able to sleep 30-45 minutes at a time)   NEXT MD VISIT: 03/28/24  OBJECTIVE:  Note: Objective measures were completed at Evaluation unless otherwise noted.  DIAGNOSTIC FINDINGS: 12/17/23 right hip MRI  IMPRESSION: 1. CAM morphology of the right femoral head and neck junction with pronounced marrow edema extending along the right superolateral femoral head and femoral neck, most pronounced posteriorly with findings suspicious for subchondral insufficiency fracture of the superior femoral head. There is suggestion of mild subchondral flattening without evidence of significant depression. 2. Prominent subchondral cyst/geode measuring up to 16 mm along the right anterosuperior acetabulum with surrounding marrow edema. High-grade loss of the subjacent articular cartilage with anterosuperior labral degeneration and tear. 3. Large field-of-view coronal images demonstrate a 10 x 9 mm well-circumscribed non-aggressive appearing lesion in the right sacral ala without significant surrounding  marrow edema, favoring a benign etiology, possibly a subcortical cyst or interosseous ganglion.  PATIENT SURVEYS:  LEFS  Extreme difficulty/unable (0), Quite a bit of difficulty (1), Moderate difficulty (2), Little difficulty (3), No difficulty (4) Survey date:  01/03/24  Any of your usual work, housework or school activities 1  2. Usual hobbies, recreational or sporting activities 0  3. Getting into/out of the bath 4  4. Walking between rooms 3  5. Putting on socks/shoes 0  6. Squatting  1  7. Lifting an object, like a bag of groceries from the floor 2  8. Performing light activities around your home 3  9. Performing heavy activities around your home 0  10. Getting into/out of a car 1  11. Walking 2 blocks 1  12. Walking 1 mile 0  13. Going up/down 10 stairs (1 flight) 1  14. Standing for 1 hour 0  15.  sitting for 1 hour 3  16. Running on even ground 0  17. Running on  uneven ground 0  18. Making sharp turns while running fast 0  19. Hopping  0  20. Rolling over in bed 3  Score total:  23/80     COGNITION: Overall cognitive status: Within functional limits for tasks assessed     SENSATION: Patient reports infrequent tingling that began in the last 10 days, but none currently.   POSTURE: weight shift left; used to avoid weight bearing on RLE  PALPATION: TTP: right hip flexors, TFL, and IT band  JOINT MOBILITY:  R hip: hypomobile and painful   LOWER EXTREMITY ROM:  Active ROM Right eval Left eval  Hip flexion 38; painful  106  Hip extension    Hip abduction    Hip adduction    Hip internal rotation    Hip external rotation    Knee flexion    Knee extension    Ankle dorsiflexion    Ankle plantarflexion    Ankle inversion    Ankle eversion     (Blank rows = not tested)  LOWER EXTREMITY MMT:  MMT Right eval Left eval  Hip flexion 3+/5; slight pain 4+/5  Hip extension    Hip abduction    Hip adduction 4/5; painful crepitus  4/5  Hip internal  rotation    Hip external rotation    Knee flexion 4/5 5/5  Knee extension 4/5; familiar pain  5/5  Ankle dorsiflexion    Ankle plantarflexion    Ankle inversion    Ankle eversion     (Blank rows = not tested)  LOWER EXTREMITY SPECIAL TESTS:  Unable to assess due to pain severity and irritability  FUNCTIONAL TESTS:  Unable to assess due to pain severity and irritability  GAIT: Assistive device utilized: None Level of assistance: Complete Independence Comments: antalgic gait pattern                                                                                                                                TREATMENT DATE:   01/08/24 Review of HEP and goals Supine: Hooklying glute sets 5 hold x 10 Relaxation breathing 5 sets of 2 inhale; 3 exhale Right hip manual distraction 5 hold x 10 Hip adduction with ball with legs straight 5 hold x 10 seated Hip abduction with belt 5 hold x 10 LAQ's 2 hold 2 x 10  01/03/24: PT evaluation, patient education, and HEP   PATIENT EDUCATION:  Education details: POC, prognosis, healing, anatomy, HEP, and goals for physical therapy Person educated: Patient Education method: Explanation, Demonstration, and Handouts Education comprehension: verbalized understanding and returned demonstration  HOME EXERCISE PROGRAM: Access Code: [email or web address] URL: [email or web address] Date: 01/08/2024 Prepared by: [facility]  Exercises - Supine Hip Adduction Isometric with Ball  - 1 x daily - 7 x weekly - 10 reps - 3-5 seconds hold - Hooklying Gluteal Sets  - 1 x daily - 7 x weekly - 2 sets - 10 reps - 3-5 seconds  hold - Seated Hip Adduction Isometrics with Ball  - 1 x daily - 7 x weekly - 2 sets - 10 reps - 5 sec hold - Seated Long Arc Quad  - 1 x daily - 7 x weekly - 2 sets - 10 reps - 5 sec hold - Seated Isometric Hip Abduction with Belt  - 1 x daily - 7 x weekly - 1 sets - 10 reps - 5 sec hold  Access Code: [email or web address] URL:  [email or web address] Date: 01/03/2024 Prepared by: Lacinda Fass  Exercises - Supine Hip Adduction Isometric with Ball  - 1 x daily - 7 x weekly - 10 reps - 3-5 seconds hold - Hooklying Gluteal Sets  - 1 x daily - 7 x weekly - 2 sets - 10 reps - 3-5 seconds hold  ASSESSMENT:  CLINICAL IMPRESSION: Today's session started with a review of HEP and goals.  Patient verbalizes agreement with set rehab goals.  Patient quite painful today so limited progression of exercise.  Some relief reported with hip distraction. Very painful with hooklying hip adduction so performed with legs straight out.  Progressed sitting exercise today and updated HEP.  Discussed trying to distract the hip on his own at home and aquatic exercise. Decreased pain to 7/10 after treatment.   Patient will benefit from continued skilled therapy services to address deficits and promote return to optimal function.      Eval:Patient is a 53 y.o. male who was seen today for physical therapy evaluation and treatment for chronic right hip pain. He presented with moderate pain severity and irritability active and passive right hip range of motion being the most aggravating to his familiar symptoms. He was provided a HEP and he was able to properly demonstrate these interventions. He reported feeling comfortable with these interventions. Recommend that he continue with skilled physical therapy to address his remaining impairments to maximize his functional mobility.    OBJECTIVE IMPAIRMENTS: Abnormal gait, decreased activity tolerance, decreased balance, decreased mobility, difficulty walking, decreased ROM, decreased strength, hypomobility, impaired tone, and pain.   ACTIVITY LIMITATIONS: lifting, standing, squatting, sleeping, stairs, transfers, dressing, and locomotion level  PARTICIPATION LIMITATIONS: driving, shopping, community activity, and yard work  PERSONAL FACTORS: Past/current experiences, Time since onset of  injury/illness/exacerbation, and 3+ comorbidities: HTN, hearing loss, and OA are also affecting patient's functional outcome.   REHAB POTENTIAL: Fair    CLINICAL DECISION MAKING: Evolving/moderate complexity  EVALUATION COMPLEXITY: Moderate   GOALS: Goals reviewed with patient? Yes  SHORT TERM GOALS: Target date: 01/24/24 Patient will be independent with his initial HEP.  Baseline: Goal status: in progress  2.  Patient will report being able to walk at least 100 feet without his familiar symptoms exceeding 8/10. Baseline:  Goal status: in progress   3.  Patient will improve his LEFS score to at least 35/80 or greater for improved perceived function with his daily activities.  Baseline:  Goal status: in progress  4.  Patient will report being able to sleep without being awakened by his familiar symptoms for at least 2 hours.  Baseline:  Goal status: in progress  LONG TERM GOALS: Target date: 02/14/24  Patient will be independent with his advanced HEP.  Baseline:  Goal status: in progress  2.  Patient will report being able to complete his daily activities without his familiar symptoms exceeding 6/10. Baseline:  Goal status: in progress  3.  Patient will improve his LEFS score to at least 45/80 for improved perceived function with his  daily activities.  Baseline:  Goal status: in progress  4.  Patient will be able to navigate at least 4 steps with a reciprocal pattern for improved household mobility.  Baseline:  Goal status: in progress  PLAN:  PT FREQUENCY: 1-2x/week  PT DURATION: 6 weeks  PLANNED INTERVENTIONS: 97164- PT Re-evaluation, 97750- Physical Performance Testing, 97110-Therapeutic exercises, 97530- Therapeutic activity, 97112- Neuromuscular re-education, 97535- Self Care, 02859- Manual therapy, [phone]- Gait training, [phone]- Electrical stimulation (unattended), 20560 (1-2 muscles), 20561 (3+ muscles)- Dry Needling, Patient/Family education, Balance training,  Stair training, Joint mobilization, Cryotherapy, and Moist heat  PLAN FOR NEXT SESSION: review and update HEP (as needed), isometrics, Nustep, lower extremity strengthening, and gait training  2:20 PM, 01/08/24 Shevawn Langenberg Small Ritik Stavola MPT [place] [facility] [place] [phone] Ph:[phone]

## 2024-01-10 ENCOUNTER — Ambulatory Visit: Payer: Self-pay | Admitting: Orthopedic Surgery

## 2024-01-18 ENCOUNTER — Other Ambulatory Visit: Payer: Self-pay

## 2024-01-18 ENCOUNTER — Other Ambulatory Visit: Payer: Self-pay | Admitting: Internal Medicine

## 2024-01-18 DIAGNOSIS — Z6836 Body mass index (BMI) 36.0-36.9, adult: Secondary | ICD-10-CM

## 2024-01-18 DIAGNOSIS — R7303 Prediabetes: Secondary | ICD-10-CM

## 2024-01-18 DIAGNOSIS — I10 Essential (primary) hypertension: Secondary | ICD-10-CM

## 2024-01-18 DIAGNOSIS — E782 Mixed hyperlipidemia: Secondary | ICD-10-CM

## 2024-01-22 ENCOUNTER — Telehealth: Payer: Self-pay | Admitting: *Deleted

## 2024-01-22 NOTE — Telephone Encounter (Signed)
 Pt needed to cancel his procedure on 12/22 due to having hip replacement surgery. He is wanting to be rescheduled in April.

## 2024-01-22 NOTE — Telephone Encounter (Signed)
 Received VM from pt requesting to reschedule procedure for 12/22  Called pt back, LMOVM to call back

## 2024-01-22 NOTE — Telephone Encounter (Signed)
 Questionnaire from recall, no referral needed

## 2024-01-23 ENCOUNTER — Ambulatory Visit (HOSPITAL_COMMUNITY): Admitting: Physical Therapy

## 2024-01-30 ENCOUNTER — Ambulatory Visit (HOSPITAL_COMMUNITY): Admitting: Physical Therapy

## 2024-02-04 ENCOUNTER — Ambulatory Visit (HOSPITAL_COMMUNITY)

## 2024-02-12 ENCOUNTER — Ambulatory Visit (HOSPITAL_COMMUNITY)

## 2024-02-14 ENCOUNTER — Other Ambulatory Visit: Payer: Self-pay

## 2024-02-14 DIAGNOSIS — M1611 Unilateral primary osteoarthritis, right hip: Secondary | ICD-10-CM

## 2024-02-14 DIAGNOSIS — M25551 Pain in right hip: Secondary | ICD-10-CM

## 2024-02-19 ENCOUNTER — Ambulatory Visit: Admitting: Orthopedic Surgery

## 2024-02-19 ENCOUNTER — Encounter: Payer: Self-pay | Admitting: Orthopedic Surgery

## 2024-02-19 VITALS — BP 127/81 | HR 76 | Ht 67.0 in | Wt 222.0 lb

## 2024-02-19 DIAGNOSIS — M1611 Unilateral primary osteoarthritis, right hip: Secondary | ICD-10-CM

## 2024-02-19 NOTE — Progress Notes (Signed)
 Orthopaedic Clinic Return  Assessment: Troy Franklin is a 53 y.o. male with the following: Right hip arthritis Increased signal within the femoral neck  Plan: [name] has advanced arthritis in the right hip, including increased signal on an MRI.  Possibly representing an insufficiency fracture.  He has tried medications, activity modifications, he has been to physical therapy several times.  At this point, I have recommended total hip arthroplasty.  He is in agreement with this plan.  We have discussed the risks and benefits.  All questions have been answered.  He has been scheduled for March 10, 2024.  Authorization has been initiated.  He will be admitted for overnight observation.  He is interested in home health physical therapy.   Follow-up: Return for After surgery; 03/10/24.   Subjective:  Chief Complaint  Patient presents with   Hip Pain    Right / wants to make sure he is set to go for surgery on the 29th    History of Present Illness: Troy Franklin is a 53 y.o. male who returns to clinic for evaluation of right hip pain.  He continues to have a lot of pain in the right groin.  He is now walking with the assistance of a cane.  He has been to therapy, which made his symptoms worse.  Medications were not sufficient.  He is a prediabetic, with his most recent A1c of 6.0.  He takes metformin .  He is interested in total hip arthroplasty.  The procedure has been discussed.   Review of Systems: No fevers or chills No numbness or tingling No chest pain No shortness of breath No bowel or bladder dysfunction No GI distress No headaches   Objective: BP 127/81   Pulse 76   Ht 5' 7 (1.702 m)   Wt 222 lb (100.7 kg)   BMI 34.77 kg/m   Physical Exam:  Alert and oriented.  No acute distress.  Ambulating with the assistance of a cane.  No tenderness to palpation over the anterior hip.  He has pain with internal rotation.  Pain with FADIR maneuver.  There is some  popping with flexion.  He starts to have pain in the anterior hip with flexion beyond 70 degrees.  Toes are warm and well-perfused.  Sensation intact distally.  IMAGING: I personally ordered and reviewed the following images:  Right hip MRI  IMPRESSION: 1. CAM morphology of the right femoral head and neck junction with pronounced marrow edema extending along the right superolateral femoral head and femoral neck, most pronounced posteriorly with findings suspicious for subchondral insufficiency fracture of the superior femoral head. There is suggestion of mild subchondral flattening without evidence of significant depression. 2. Prominent subchondral cyst/geode measuring up to 16 mm along the right anterosuperior acetabulum with surrounding marrow edema. High-grade loss of the subjacent articular cartilage with anterosuperior labral degeneration and tear. 3. Large field-of-view coronal images demonstrate a 10 x 9 mm well-circumscribed non-aggressive appearing lesion in the right sacral ala without significant surrounding marrow edema, favoring a benign etiology, possibly a subcortical cyst or interosseous ganglion.  [name], MD 02/19/2024 10:27 AM

## 2024-02-19 NOTE — H&P (View-Only) (Signed)
 Orthopaedic Clinic Return  Assessment: Troy Franklin is a 53 y.o. male with the following: Right hip arthritis Increased signal within the femoral neck  Plan: Troy Franklin has advanced arthritis in the right hip, including increased signal on an MRI.  Possibly representing an insufficiency fracture.  He has tried medications, activity modifications, he has been to physical therapy several times.  At this point, I have recommended total hip arthroplasty.  He is in agreement with this plan.  We have discussed the risks and benefits.  All questions have been answered.  He has been scheduled for March 10, 2024.  Authorization has been initiated.  He will be admitted for overnight observation.  He is interested in home health physical therapy.   Follow-up: Return for After surgery; 03/10/24.   Subjective:  Chief Complaint  Patient presents with   Hip Pain    Right / wants to make sure he is set to go for surgery on the 29th    History of Present Illness: Troy Franklin is a 53 y.o. male who returns to clinic for evaluation of right hip pain.  He continues to have a lot of pain in the right groin.  He is now walking with the assistance of a cane.  He has been to therapy, which made his symptoms worse.  Medications were not sufficient.  He is a prediabetic, with his most recent A1c of 6.0.  He takes metformin .  He is interested in total hip arthroplasty.  The procedure has been discussed.   Review of Systems: No fevers or chills No numbness or tingling No chest pain No shortness of breath No bowel or bladder dysfunction No GI distress No headaches   Objective: BP 127/81   Pulse 76   Ht 5' 7 (1.702 m)   Wt 222 lb (100.7 kg)   BMI 34.77 kg/m   Physical Exam:  Alert and oriented.  No acute distress.  Ambulating with the assistance of a cane.  No tenderness to palpation over the anterior hip.  He has pain with internal rotation.  Pain with FADIR maneuver.  There is some  popping with flexion.  He starts to have pain in the anterior hip with flexion beyond 70 degrees.  Toes are warm and well-perfused.  Sensation intact distally.  IMAGING: I personally ordered and reviewed the following images:  Right hip MRI  IMPRESSION: 1. CAM morphology of the right femoral head and neck junction with pronounced marrow edema extending along the right superolateral femoral head and femoral neck, most pronounced posteriorly with findings suspicious for subchondral insufficiency fracture of the superior femoral head. There is suggestion of mild subchondral flattening without evidence of significant depression. 2. Prominent subchondral cyst/geode measuring up to 16 mm along the right anterosuperior acetabulum with surrounding marrow edema. High-grade loss of the subjacent articular cartilage with anterosuperior labral degeneration and tear. 3. Large field-of-view coronal images demonstrate a 10 x 9 mm well-circumscribed non-aggressive appearing lesion in the right sacral ala without significant surrounding marrow edema, favoring a benign etiology, possibly a subcortical cyst or interosseous ganglion.  Troy DELENA Horde, MD 02/19/2024 10:27 AM

## 2024-02-25 ENCOUNTER — Other Ambulatory Visit: Payer: Self-pay

## 2024-02-25 DIAGNOSIS — R7303 Prediabetes: Secondary | ICD-10-CM

## 2024-02-25 DIAGNOSIS — Z6836 Body mass index (BMI) 36.0-36.9, adult: Secondary | ICD-10-CM

## 2024-03-03 ENCOUNTER — Encounter (HOSPITAL_COMMUNITY): Payer: Self-pay

## 2024-03-03 ENCOUNTER — Ambulatory Visit (HOSPITAL_COMMUNITY): Admit: 2024-03-03 | Admitting: Gastroenterology

## 2024-03-03 SURGERY — COLONOSCOPY
Anesthesia: Choice

## 2024-03-03 NOTE — Patient Instructions (Signed)
 "         Troy Franklin  03/03/2024     @PREFPERIOPPHARMACY @   Your procedure is scheduled on  03/10/2024.   Report to [facility] at  0600  A.M.   Call this number if you have problems the morning of surgery:  [phone]  If you experience any cold or flu symptoms such as cough, fever, chills, shortness of breath, etc. between now and your scheduled surgery, please notify us  at the above number.   Remember:  Do not eat after midnight.   You may drink clear liquids until 0330 am on 03/10/2024.      Clear liquids allowed are:                    Water, Carbonated beverages (diabetics please choose diet or no sugar options), Black Coffee Only (No creamer, milk or cream, including half & half and powdered creamer), and Clear Sports drink (No red color; diabetics please choose diet or no sugar options)          At 0330 am on 03/10/2024 drink your carb drink. You can have nothing else to drink after this.    Take these medicines the morning of surgery with A SIP OF WATER                                                         None.    Do not wear jewelry, make-up or nail polish, including gel polish,  artificial nails, or any other type of covering on natural nails (fingers and  toes).  Do not wear lotions, powders, or perfumes, or deodorant.  Do not shave 48 hours prior to surgery.  Men may shave face and neck.  Do not bring valuables to the hospital.  [facility] is not responsible for any belongings or valuables.  Contacts, dentures or bridgework may not be worn into surgery.  Leave your suitcase in the car.  After surgery it may be brought to your room.  For patients admitted to the hospital, discharge time will be determined by your treatment team.  Patients discharged the day of surgery will not be allowed to drive home.    Special instructions:  DO NOT smoke tobacco or vape for 24 hours before your procedure.  Please read over the following fact sheets that you  were given. Pain Booklet, Coughing and Deep Breathing, Blood Transfusion Information, Total Joint Packet, MRSA Information, Surgical Site Infection Prevention, Anesthesia Post-op Instructions, and Care and Recovery After Surgery      Anterior Approach for Total Hip Replacement Surgery: What to Know After After an anterior approach for total hip replacement, it's common to have redness, pain, and swelling at your cut from surgery. The cut from surgery is also called an incision. With the anterior approach, the cut from surgery was made at the front of your hip. You may also have: Stiffness. Discomfort. A small amount of blood or clear fluid coming from your cut. Follow these instructions at home: Medicines Take your medicines only as told. If you're taking blood thinners: Take them at the same time each day. Talk with your health care provider before taking any products you can buy at the store. Do not do things that could hurt or bruise you. Be careful to avoid  falls. Wear an alert bracelet or carry a card that says you take blood thinners. You may need to take steps to help treat or prevent trouble pooping (constipation), such as: Taking medicines to help you poop. Eating foods high in fiber, like beans, whole grains, and fresh fruits and vegetables. Drinking more fluids as told. Ask your provider if it's safe to drive or use machines while taking your medicine. Incision care  Take care of your cut as told. Make sure you: Wash your hands with soap and water for at least 20 seconds before and after you change your bandage. If you can't use soap and water, use hand sanitizer. Change your bandage. Leave stitches or skin glue alone. Leave tape strips alone unless you're told to take them off. You may trim the edges of the tape strips if they curl up. Do not take baths, swim, or use a hot tub until you're told it's OK. Ask if you can shower. Check the area around your cut every day for  signs of infection. Check for: More redness, swelling, or pain. More fluid or blood. Warmth. Pus or a bad smell. Managing pain, stiffness, and swelling  Use ice or an ice pack as told. Place a towel between your skin and the ice. Leave the ice on for 20 minutes, 2-3 times a day. If your skin turns red, take off the ice right away to prevent skin damage. The risk of damage is higher if you can't feel pain, heat, or cold. Move your toes often to reduce stiffness and swelling. Raise your leg above the level of your heart while you're lying down. Use pillows as needed. Activity Rest as told. Get up to take short walks at least every 2 hours during the day. This helps you breathe better and keeps your blood flowing. Ask for help if you feel weak or unsteady. Exercise as told. Do not stand or walk on your affected leg until you're told it's OK. Use a walker, crutches, or a cane. Ask what things are safe for you to do at home. Ask when you can go back to work or school. General instructions Ask when it's safe to drive. Wear compression stockings as told by your provider. These stockings help to prevent blood clots and reduce swelling in your legs. Keep doing breathing exercises as told. These exercises help prevent lung infection. Do not smoke, vape, or use nicotine or tobacco. Do not have dental work or cleanings for at least 3 months. Ask your provider if you need to take antibiotics before you have dental work or have your teeth cleaned. Tell all your providers about your new joint. Keep all follow-up visits. Your provider will need to check on how your hip is healing. Your provider may give you more instructions. Make sure you know what you can and can't do. Contact a health care provider if: You have a fever or chills. You have a cough. You have any signs of infection. You have very bad pain and the medicine does not help. You have redness, swelling, pain, or warmth in your leg,  including the back of your lower leg. Your cut breaks open after stitches or staples are taken out. Get help right away if: You feel short of breath. You have trouble breathing. You have chest pain. These symptoms may be an emergency. Call 911 right away. Do not wait to see if the symptoms will go away. Do not drive yourself to the hospital. This information is  not intended to replace advice given to you by your health care provider. Make sure you discuss any questions you have with your health care provider. Document Revised: 11/30/2022 Document Reviewed: 09/25/2022 Elsevier Patient Education  2024 [employer].General Anesthesia, Adult, Care After The following information offers guidance on how to care for yourself after your procedure. Your health care provider may also give you more specific instructions. If you have problems or questions, contact your health care provider. What can I expect after the procedure? After the procedure, it is common for people to: Have pain or discomfort at the IV site. Have nausea or vomiting. Have a sore throat or hoarseness. Have trouble concentrating. Feel cold or chills. Feel weak, sleepy, or tired (fatigue). Have soreness and body aches. These can affect parts of the body that were not involved in surgery. Follow these instructions at home: For the time period you were told by your health care provider:  Rest. Do not participate in activities where you could fall or become injured. Do not drive or use machinery. Do not drink alcohol. Do not take sleeping pills or medicines that cause drowsiness. Do not make important decisions or sign legal documents. Do not take care of children on your own. General instructions Drink enough fluid to keep your urine pale yellow. If you have sleep apnea, surgery and certain medicines can increase your risk for breathing problems. Follow instructions from your health care provider about wearing your sleep  device: Anytime you are sleeping, including during daytime naps. While taking prescription pain medicines, sleeping medicines, or medicines that make you drowsy. Return to your normal activities as told by your health care provider. Ask your health care provider what activities are safe for you. Take over-the-counter and prescription medicines only as told by your health care provider. Do not use any products that contain nicotine or tobacco. These products include cigarettes, chewing tobacco, and vaping devices, such as e-cigarettes. These can delay incision healing after surgery. If you need help quitting, ask your health care provider. Contact a health care provider if: You have nausea or vomiting that does not get better with medicine. You vomit every time you eat or drink. You have pain that does not get better with medicine. You cannot urinate or have bloody urine. You develop a skin rash. You have a fever. Get help right away if: You have trouble breathing. You have chest pain. You vomit blood. These symptoms may be an emergency. Get help right away. Call 911. Do not wait to see if the symptoms will go away. Do not drive yourself to the hospital. Summary After the procedure, it is common to have a sore throat, hoarseness, nausea, vomiting, or to feel weak, sleepy, or fatigue. For the time period you were told by your health care provider, do not drive or use machinery. Get help right away if you have difficulty breathing, have chest pain, or vomit blood. These symptoms may be an emergency. This information is not intended to replace advice given to you by your health care provider. Make sure you discuss any questions you have with your health care provider. Document Revised: 05/27/2021 Document Reviewed: 05/27/2021 Elsevier Patient Education  2024 Else[employer] Inc.How to Use Chlorhexidine  at Home in the Shower Chlorhexidine  gluconate (CHG) is a germ-killing (antiseptic) wash that's  used to clean the skin. It can get rid of the germs that normally live on the skin and can keep them away for about 24 hours. If you're having surgery, you  may be told to shower with CHG at home the night before surgery. This can help lower your risk for infection. To use CHG wash in the shower, follow the steps below. Supplies needed: CHG body wash. Clean washcloth. Clean towel. How to use CHG in the shower Follow these steps unless you're told to use CHG in a different way: Start the shower. Use your normal soap and shampoo to wash your face and hair. Turn off the shower or move out of the shower stream. Pour CHG onto a clean washcloth. Do not use any type of brush or rough sponge. Start at your neck, washing your body down to your toes. Make sure you: Wash the part of your body where the surgery will be done for at least 1 minute. Do not scrub. Do not use CHG on your head or face unless your health care provider tells you to. If it gets into your ears or eyes, rinse them well with water. Do not wash your genitals with CHG. Wash your back and under your arms. Make sure to wash skin folds. Let the CHG sit on your skin for 1-2 minutes or as long as told. Rinse your entire body in the shower, including all body creases and folds. Turn off the shower. Dry off with a clean towel. Do not put anything on your skin afterward, such as powder, lotion, or perfume. Put on clean clothes or pajamas. If it's the night before surgery, sleep in clean sheets. General tips Use CHG only as told, and follow the instructions on the label. Use the full amount of CHG as told. This is often one bottle. Do not smoke and stay away from flames after using CHG. Your skin may feel sticky after using CHG. This is normal. The sticky feeling will go away as the CHG dries. Do not use CHG: If you have a chlorhexidine  allergy or have reacted to chlorhexidine  in the past. On open wounds or areas of skin that have  broken skin, cuts, or scrapes. On babies younger than 59 months of age. Contact a health care provider if: You have questions about using CHG. Your skin gets irritated or itchy. You have a rash after using CHG. You swallow any CHG. Call your local poison control center [phone] in the [place] Your eyes itch badly, or they become very red or swollen. Your hearing changes. You have trouble seeing. If you can't reach your provider, go to an urgent care or emergency room. Do not drive yourself. Get help right away if: You have swelling or tingling in your mouth or throat. You make high-pitched whistling sounds when you breathe, most often when you breathe out (wheeze). You have trouble breathing. These symptoms may be an emergency. Call 911 right away. Do not wait to see if the symptoms will go away. Do not drive yourself to the hospital. This information is not intended to replace advice given to you by your health care provider. Make sure you discuss any questions you have with your health care provider. Document Revised: 09/12/2022 Document Reviewed: 09/08/2021 Elsevier Patient Education  2024 Else[employer] Inc.How to Use an Incentive Spirometer An incentive spirometer is a tool that measures how well you are filling your lungs with each breath. Learning to take long, deep breaths using this tool can help you keep your lungs clear and active. This may help to reverse or lessen your chance of developing breathing (pulmonary) problems, especially infection. You may be asked to use a spirometer: After  a surgery. If you have a lung problem or a history of smoking. After a long period of time when you have been unable to move or be active. If the spirometer includes an indicator to show the highest number that you have reached, your health care provider or respiratory therapist will help you set a goal. Keep a log of your progress as told by your health care provider. What are the  risks? Breathing too quickly may cause dizziness or cause you to pass out. Take your time so you do not get dizzy or light-headed. If you are in pain, you may need to take pain medicine before doing incentive spirometry. It is harder to take a deep breath if you are having pain. How to use your incentive spirometer  Sit up on the edge of your bed or on a chair. Hold the incentive spirometer so that it is in an upright position. Before you use the spirometer, breathe out normally. Place the mouthpiece in your mouth. Make sure your lips are closed tightly around it. Breathe in slowly and as deeply as you can through your mouth, causing the piston or the ball to rise toward the top of the chamber. Hold your breath for 3-5 seconds, or for as long as possible. If the spirometer includes a coach indicator, use this to guide you in breathing. Slow down your breathing if the indicator goes above the marked areas. Remove the mouthpiece from your mouth and breathe out normally. The piston or ball will return to the bottom of the chamber. Rest for a few seconds, then repeat the steps 10 or more times. Take your time and take a few normal breaths between deep breaths so that you do not get dizzy or light-headed. Do this every 1-2 hours when you are awake. If the spirometer includes a goal marker to show the highest number you have reached (best effort), use this as a goal to work toward during each repetition. After each set of 10 deep breaths, cough a few times. This will help to make sure that your lungs are clear. If you have an incision on your chest or abdomen from surgery, place a pillow or a rolled-up towel firmly against the incision when you cough. This can help to reduce pain while taking deep breaths and coughing. General tips When you are able to get out of bed: Walk around often. Continue to take deep breaths and cough in order to clear your lungs. Keep using the incentive spirometer until  your health care provider says it is okay to stop using it. If you have been in the hospital, you may be told to keep using the spirometer at home. Contact a health care provider if: You are having difficulty using the spirometer. You have trouble using the spirometer as often as instructed. Your pain medicine is not giving enough relief for you to use the spirometer as told. You have a fever. Get help right away if: You develop shortness of breath. You develop a cough with bloody mucus from the lungs. You have fluid or blood coming from an incision site after you cough. Summary An incentive spirometer is a tool that can help you learn to take long, deep breaths to keep your lungs clear and active. You may be asked to use a spirometer after a surgery, if you have a lung problem or a history of smoking, or if you have been inactive for a long period of time. Use your incentive spirometer  as instructed every 1-2 hours while you are awake. If you have an incision on your chest or abdomen, place a pillow or a rolled-up towel firmly against your incision when you cough. This will help to reduce pain. Get help right away if you have shortness of breath, you cough up bloody mucus, or blood comes from your incision when you cough. This information is not intended to replace advice given to you by your health care provider. Make sure you discuss any questions you have with your health care provider. Document Revised: 01/05/2023 Document Reviewed: 01/05/2023 Elsevier Patient Education  2024 [employer]. "

## 2024-03-04 ENCOUNTER — Other Ambulatory Visit: Payer: Self-pay

## 2024-03-04 ENCOUNTER — Encounter (HOSPITAL_COMMUNITY)
Admission: RE | Admit: 2024-03-04 | Discharge: 2024-03-04 | Disposition: A | Discharge status: Home / Self Care | Source: Ambulatory Visit | Attending: Orthopedic Surgery | Admitting: Orthopedic Surgery

## 2024-03-04 ENCOUNTER — Encounter (HOSPITAL_COMMUNITY): Payer: Self-pay

## 2024-03-04 VITALS — BP 127/81 | HR 76 | Resp 18 | Ht 67.0 in | Wt 222.0 lb

## 2024-03-04 DIAGNOSIS — Z01818 Encounter for other preprocedural examination: Secondary | ICD-10-CM | POA: Insufficient documentation

## 2024-03-04 DIAGNOSIS — Z01812 Encounter for preprocedural laboratory examination: Secondary | ICD-10-CM | POA: Diagnosis present

## 2024-03-04 DIAGNOSIS — I10 Essential (primary) hypertension: Secondary | ICD-10-CM | POA: Insufficient documentation

## 2024-03-04 DIAGNOSIS — R7303 Prediabetes: Secondary | ICD-10-CM | POA: Diagnosis not present

## 2024-03-04 DIAGNOSIS — Z01810 Encounter for preprocedural cardiovascular examination: Secondary | ICD-10-CM | POA: Diagnosis present

## 2024-03-04 LAB — CBC WITH DIFFERENTIAL/PLATELET
Abs Immature Granulocytes: 0.02 K/uL (ref 0.00–0.07)
Basophils Absolute: 0.1 K/uL (ref 0.0–0.1)
Basophils Relative: 1 %
Eosinophils Absolute: 0.2 K/uL (ref 0.0–0.5)
Eosinophils Relative: 2 %
HCT: 42.7 % (ref 39.0–52.0)
Hemoglobin: 14.7 g/dL (ref 13.0–17.0)
Immature Granulocytes: 0 %
Lymphocytes Relative: 33 %
Lymphs Abs: 2.2 K/uL (ref 0.7–4.0)
MCH: 31.4 pg (ref 26.0–34.0)
MCHC: 34.4 g/dL (ref 30.0–36.0)
MCV: 91.2 fL (ref 80.0–100.0)
Monocytes Absolute: 0.9 K/uL (ref 0.1–1.0)
Monocytes Relative: 14 %
Neutro Abs: 3.2 K/uL (ref 1.7–7.7)
Neutrophils Relative %: 50 %
Platelets: 191 K/uL (ref 150–400)
RBC: 4.68 MIL/uL (ref 4.22–5.81)
RDW: 11.9 % (ref 11.5–15.5)
WBC: 6.6 K/uL (ref 4.0–10.5)
nRBC: 0 % (ref 0.0–0.2)

## 2024-03-04 LAB — HEMOGLOBIN A1C
Hgb A1c MFr Bld: 5.9 % — ABNORMAL HIGH (ref 4.8–5.6)
Mean Plasma Glucose: 122.63 mg/dL

## 2024-03-04 LAB — BASIC METABOLIC PANEL WITH GFR
Anion gap: 12 (ref 5–15)
BUN: 11 mg/dL (ref 6–20)
CO2: 24 mmol/L (ref 22–32)
Calcium: 9.2 mg/dL (ref 8.9–10.3)
Chloride: 105 mmol/L (ref 98–111)
Creatinine, Ser: 0.87 mg/dL (ref 0.61–1.24)
GFR, Estimated: >60 mL/min
Glucose, Bld: 82 mg/dL (ref 70–99)
Potassium: 4.2 mmol/L (ref 3.5–5.1)
Sodium: 140 mmol/L (ref 135–145)

## 2024-03-04 LAB — TYPE AND SCREEN
ABO/RH(D): O POS
Antibody Screen: NEGATIVE

## 2024-03-04 LAB — SURGICAL PCR SCREEN
MRSA, PCR: NEGATIVE
Staphylococcus aureus: NEGATIVE

## 2024-03-10 ENCOUNTER — Ambulatory Visit (HOSPITAL_COMMUNITY)

## 2024-03-10 ENCOUNTER — Encounter (HOSPITAL_COMMUNITY)
Admission: RE | Disposition: A | Discharge status: Home / Self Care | Payer: Self-pay | Source: Home / Self Care | Attending: Orthopedic Surgery

## 2024-03-10 ENCOUNTER — Observation Stay (HOSPITAL_COMMUNITY)
Admission: RE | Admit: 2024-03-10 | Discharge: 2024-03-11 | Disposition: A | Attending: Orthopedic Surgery | Admitting: Orthopedic Surgery

## 2024-03-10 ENCOUNTER — Observation Stay (HOSPITAL_COMMUNITY)

## 2024-03-10 ENCOUNTER — Encounter (HOSPITAL_COMMUNITY): Payer: Self-pay | Admitting: Orthopedic Surgery

## 2024-03-10 ENCOUNTER — Ambulatory Visit (HOSPITAL_COMMUNITY): Payer: Self-pay | Admitting: Certified Registered"

## 2024-03-10 ENCOUNTER — Other Ambulatory Visit: Payer: Self-pay

## 2024-03-10 DIAGNOSIS — Z87891 Personal history of nicotine dependence: Secondary | ICD-10-CM | POA: Diagnosis not present

## 2024-03-10 DIAGNOSIS — M1611 Unilateral primary osteoarthritis, right hip: Secondary | ICD-10-CM | POA: Diagnosis not present

## 2024-03-10 DIAGNOSIS — I10 Essential (primary) hypertension: Secondary | ICD-10-CM | POA: Diagnosis not present

## 2024-03-10 DIAGNOSIS — Z79899 Other long term (current) drug therapy: Secondary | ICD-10-CM | POA: Diagnosis not present

## 2024-03-10 HISTORY — PX: TOTAL HIP ARTHROPLASTY: SHX124

## 2024-03-10 LAB — GLUCOSE, CAPILLARY
Glucose-Capillary: 101 mg/dL — ABNORMAL HIGH (ref 70–99)
Glucose-Capillary: 106 mg/dL — ABNORMAL HIGH (ref 70–99)
Glucose-Capillary: 155 mg/dL — ABNORMAL HIGH (ref 70–99)

## 2024-03-10 LAB — HEMOGLOBIN AND HEMATOCRIT, BLOOD
HCT: 35.3 % — ABNORMAL LOW (ref 39.0–52.0)
Hemoglobin: 12 g/dL — ABNORMAL LOW (ref 13.0–17.0)

## 2024-03-10 SURGERY — ARTHROPLASTY, HIP, TOTAL, ANTERIOR APPROACH
Anesthesia: Spinal | Site: Hip | Laterality: Right

## 2024-03-10 MED ORDER — PROPOFOL 10 MG/ML IV BOLUS
INTRAVENOUS | Status: AC
  Filled 2024-03-10: qty 20

## 2024-03-10 MED ORDER — CELECOXIB 100 MG PO CAPS
100.0000 mg | ORAL_CAPSULE | Freq: Two times a day (BID) | ORAL | Status: DC
  Administered 2024-03-10 – 2024-03-11 (×3): 100 mg via ORAL
  Filled 2024-03-10 (×2): qty 1

## 2024-03-10 MED ORDER — HYDROMORPHONE HCL 1 MG/ML IJ SOLN
0.2500 mg | INTRAMUSCULAR | Status: DC | PRN
  Administered 2024-03-10 (×2): 0.5 mg via INTRAVENOUS
  Filled 2024-03-10 (×2): qty 0.5

## 2024-03-10 MED ORDER — INSULIN ASPART 100 UNIT/ML IJ SOLN: 0.0000 [IU] | Freq: Every day | INTRAMUSCULAR | Status: DC

## 2024-03-10 MED ORDER — ASPIRIN 81 MG PO CHEW
81.0000 mg | CHEWABLE_TABLET | Freq: Two times a day (BID) | ORAL | Status: DC
  Administered 2024-03-11: 81 mg via ORAL
  Filled 2024-03-10: qty 1

## 2024-03-10 MED ORDER — ONDANSETRON HCL 4 MG/2ML IJ SOLN: 4.0000 mg | Freq: Four times a day (QID) | INTRAMUSCULAR | Status: DC | PRN

## 2024-03-10 MED ORDER — BUPIVACAINE HCL (PF) 0.5 % IJ SOLN
INTRAMUSCULAR | Status: DC | PRN
  Administered 2024-03-10: 3 mL

## 2024-03-10 MED ORDER — OXYCODONE HCL 5 MG PO TABS: 5.0000 mg | ORAL_TABLET | ORAL | Status: DC | PRN

## 2024-03-10 MED ORDER — BACITRACIN ZINC 500 UNIT/GM EX OINT
TOPICAL_OINTMENT | CUTANEOUS | Status: AC
  Filled 2024-03-10: qty 1.8

## 2024-03-10 MED ORDER — PROPOFOL 500 MG/50ML IV EMUL
INTRAVENOUS | Status: AC
  Filled 2024-03-10: qty 50

## 2024-03-10 MED ORDER — OXYCODONE HCL 5 MG PO TABS
10.0000 mg | ORAL_TABLET | ORAL | Status: DC | PRN
  Administered 2024-03-10: 10 mg via ORAL
  Filled 2024-03-10: qty 2

## 2024-03-10 MED ORDER — 0.9 % SODIUM CHLORIDE (POUR BTL) OPTIME
TOPICAL | Status: DC | PRN
  Administered 2024-03-10: 1000 mL

## 2024-03-10 MED ORDER — PHENYLEPHRINE 80 MCG/ML (10ML) SYRINGE FOR IV PUSH (FOR BLOOD PRESSURE SUPPORT)
PREFILLED_SYRINGE | INTRAVENOUS | Status: AC
  Filled 2024-03-10: qty 10

## 2024-03-10 MED ORDER — FENTANYL CITRATE (PF) 100 MCG/2ML IJ SOLN
INTRAMUSCULAR | Status: AC
  Filled 2024-03-10: qty 2

## 2024-03-10 MED ORDER — KETAMINE HCL 50 MG/5ML IJ SOSY
PREFILLED_SYRINGE | INTRAMUSCULAR | Status: AC
  Filled 2024-03-10: qty 5

## 2024-03-10 MED ORDER — VANCOMYCIN HCL 1000 MG IV SOLR
INTRAVENOUS | Status: DC | PRN
  Administered 2024-03-10: 1000 mg

## 2024-03-10 MED ORDER — VANCOMYCIN HCL 1000 MG IV SOLR
INTRAVENOUS | Status: AC
  Filled 2024-03-10: qty 20

## 2024-03-10 MED ORDER — ORAL CARE MOUTH RINSE: 15.0000 mL | Freq: Once | OROMUCOSAL | Status: DC

## 2024-03-10 MED ORDER — PHENYLEPHRINE HCL-NACL 20-0.9 MG/250ML-% IV SOLN
INTRAVENOUS | Status: DC | PRN
  Administered 2024-03-10 (×3): 80 ug via INTRAVENOUS

## 2024-03-10 MED ORDER — LACTATED RINGERS IV SOLN: INTRAVENOUS | Status: DC | PRN

## 2024-03-10 MED ORDER — OXYCODONE HCL 5 MG PO TABS
5.0000 mg | ORAL_TABLET | Freq: Once | ORAL | Status: AC | PRN
  Administered 2024-03-10: 5 mg via ORAL
  Filled 2024-03-10: qty 1

## 2024-03-10 MED ORDER — AMLODIPINE BESYLATE 5 MG PO TABS
5.0000 mg | ORAL_TABLET | Freq: Every day | ORAL | Status: DC
  Administered 2024-03-10 – 2024-03-11 (×2): 5 mg via ORAL
  Filled 2024-03-10 (×2): qty 1

## 2024-03-10 MED ORDER — KETAMINE HCL 50 MG/5ML IJ SOSY
PREFILLED_SYRINGE | INTRAMUSCULAR | Status: DC | PRN
  Administered 2024-03-10 (×5): 10 mg via INTRAVENOUS

## 2024-03-10 MED ORDER — ONDANSETRON HCL 4 MG PO TABS: 4.0000 mg | ORAL_TABLET | Freq: Four times a day (QID) | ORAL | Status: DC | PRN

## 2024-03-10 MED ORDER — MORPHINE SULFATE (PF) 2 MG/ML IV SOLN
0.5000 mg | INTRAVENOUS | Status: DC | PRN
  Administered 2024-03-10 – 2024-03-11 (×2): 0.5 mg via INTRAVENOUS
  Filled 2024-03-10 (×2): qty 1

## 2024-03-10 MED ORDER — BUPIVACAINE HCL (PF) 0.5 % IJ SOLN
INTRAMUSCULAR | Status: AC
  Filled 2024-03-10: qty 30

## 2024-03-10 MED ORDER — PROPOFOL 500 MG/50ML IV EMUL
INTRAVENOUS | Status: DC | PRN
  Administered 2024-03-10: 80 mg via INTRAVENOUS
  Administered 2024-03-10: 75 ug/kg/min via INTRAVENOUS

## 2024-03-10 MED ORDER — METFORMIN HCL ER 500 MG PO TB24
500.0000 mg | ORAL_TABLET | Freq: Two times a day (BID) | ORAL | Status: DC
  Administered 2024-03-10 – 2024-03-11 (×2): 500 mg via ORAL
  Filled 2024-03-10 (×2): qty 1

## 2024-03-10 MED ORDER — BACITRACIN ZINC 500 UNIT/GM EX OINT
TOPICAL_OINTMENT | CUTANEOUS | Status: DC | PRN
  Administered 2024-03-10: 2 via TOPICAL

## 2024-03-10 MED ORDER — CEFAZOLIN SODIUM-DEXTROSE 2-4 GM/100ML-% IV SOLN
2.0000 g | INTRAVENOUS | Status: AC
  Administered 2024-03-10: 2 g via INTRAVENOUS
  Filled 2024-03-10: qty 100

## 2024-03-10 MED ORDER — DIPHENHYDRAMINE HCL 12.5 MG/5ML PO ELIX: 12.5000 mg | ORAL_SOLUTION | ORAL | Status: DC | PRN

## 2024-03-10 MED ORDER — SODIUM CHLORIDE 0.9 % IR SOLN
Status: DC | PRN
  Administered 2024-03-10: 3000 mL

## 2024-03-10 MED ORDER — IRBESARTAN 150 MG PO TABS
150.0000 mg | ORAL_TABLET | Freq: Every day | ORAL | Status: DC
  Administered 2024-03-10 – 2024-03-11 (×2): 150 mg via ORAL
  Filled 2024-03-10 (×2): qty 1

## 2024-03-10 MED ORDER — ACETAMINOPHEN 500 MG PO TABS
1000.0000 mg | ORAL_TABLET | Freq: Three times a day (TID) | ORAL | Status: DC
  Administered 2024-03-10 – 2024-03-11 (×2): 1000 mg via ORAL
  Filled 2024-03-10 (×2): qty 2

## 2024-03-10 MED ORDER — OXYCODONE HCL 5 MG/5ML PO SOLN: 5.0000 mg | Freq: Once | ORAL | Status: AC | PRN

## 2024-03-10 MED ORDER — CHLORHEXIDINE GLUCONATE 0.12 % MT SOLN: 15.0000 mL | Freq: Once | OROMUCOSAL | Status: DC

## 2024-03-10 MED ORDER — FENTANYL CITRATE (PF) 100 MCG/2ML IJ SOLN
INTRAMUSCULAR | Status: DC | PRN
  Administered 2024-03-10 (×2): 50 ug via INTRAVENOUS

## 2024-03-10 MED ORDER — MIDAZOLAM HCL 5 MG/5ML IJ SOLN
INTRAMUSCULAR | Status: DC | PRN
  Administered 2024-03-10: 2 mg via INTRAVENOUS

## 2024-03-10 MED ORDER — DEXMEDETOMIDINE HCL IN NACL 80 MCG/20ML IV SOLN
INTRAVENOUS | Status: DC | PRN
  Administered 2024-03-10: 12 ug via INTRAVENOUS

## 2024-03-10 MED ORDER — TRANEXAMIC ACID-NACL 1000-0.7 MG/100ML-% IV SOLN
1000.0000 mg | INTRAVENOUS | Status: AC
  Administered 2024-03-10: 1000 mg via INTRAVENOUS
  Filled 2024-03-10: qty 100

## 2024-03-10 MED ORDER — CYCLOBENZAPRINE HCL 10 MG PO TABS
5.0000 mg | ORAL_TABLET | Freq: Three times a day (TID) | ORAL | Status: DC | PRN
  Administered 2024-03-10: 5 mg via ORAL
  Filled 2024-03-10 (×3): qty 1

## 2024-03-10 MED ORDER — CEFAZOLIN SODIUM-DEXTROSE 2-4 GM/100ML-% IV SOLN
2.0000 g | Freq: Three times a day (TID) | INTRAVENOUS | Status: AC
  Administered 2024-03-10 – 2024-03-11 (×3): 2 g via INTRAVENOUS
  Filled 2024-03-10 (×3): qty 100

## 2024-03-10 MED ORDER — INSULIN ASPART 100 UNIT/ML IJ SOLN: 0.0000 [IU] | Freq: Three times a day (TID) | INTRAMUSCULAR | Status: DC

## 2024-03-10 MED ORDER — ONDANSETRON HCL 4 MG/2ML IJ SOLN
INTRAMUSCULAR | Status: AC
  Filled 2024-03-10: qty 2

## 2024-03-10 MED ORDER — MIDAZOLAM HCL 2 MG/2ML IJ SOLN
INTRAMUSCULAR | Status: AC
  Filled 2024-03-10: qty 2

## 2024-03-10 MED ORDER — DEXMEDETOMIDINE HCL IN NACL 80 MCG/20ML IV SOLN
INTRAVENOUS | Status: AC
  Filled 2024-03-10: qty 20

## 2024-03-10 MED ORDER — LACTATED RINGERS IV SOLN: INTRAVENOUS | Status: DC

## 2024-03-10 MED ORDER — ONDANSETRON HCL 4 MG/2ML IJ SOLN
INTRAMUSCULAR | Status: DC | PRN
  Administered 2024-03-10: 4 mg via INTRAVENOUS

## 2024-03-10 MED ORDER — AMLODIPINE-OLMESARTAN 5-20 MG PO TABS: 1.0000 | ORAL_TABLET | Freq: Every day | ORAL | Status: DC

## 2024-03-10 MED ORDER — ATORVASTATIN CALCIUM 20 MG PO TABS
20.0000 mg | ORAL_TABLET | Freq: Every day | ORAL | Status: DC
  Administered 2024-03-10 – 2024-03-11 (×2): 20 mg via ORAL
  Filled 2024-03-10 (×2): qty 1

## 2024-03-10 MED ORDER — CELECOXIB 100 MG PO CAPS
ORAL_CAPSULE | ORAL | Status: AC
  Filled 2024-03-10: qty 1

## 2024-03-10 SURGICAL SUPPLY — 45 items
BLADE SAGITTAL 25.0X1.27X90 (BLADE) ×1 IMPLANT
CHLORAPREP W/TINT 26 (MISCELLANEOUS) ×1 IMPLANT
COUNTER NDL 20CT MAGNET RED (NEEDLE) ×1 IMPLANT
COUNTER NEEDLE MAGNETIC 40 RED (SET/KITS/TRAYS/PACK) ×1 IMPLANT
COVER LIGHT HANDLE (MISCELLANEOUS) ×2 IMPLANT
COVER PERINEAL POST (MISCELLANEOUS) ×1 IMPLANT
DRAPE C-ARM FOLDED MOBILE STRL (DRAPES) ×1 IMPLANT
DRAPE HALF SHEET 40X57 (DRAPES) IMPLANT
DRAPE STERI IOBAN 125X83 (DRAPES) ×1 IMPLANT
DRAPE U-SHAPE 47X51 STRL (DRAPES) ×3 IMPLANT
DRAPE UTILITY W/TAPE 26X15 (DRAPES) ×1 IMPLANT
DRSG AQUACEL AG ADV 3.5X10 (GAUZE/BANDAGES/DRESSINGS) ×1 IMPLANT
DRSG TEGADERM 4X4.75 (GAUZE/BANDAGES/DRESSINGS) IMPLANT
ELECT BLADE 6 FLAT ULTRCLN (ELECTRODE) ×1 IMPLANT
ELECTRODE REM PT RTRN 9FT ADLT (ELECTROSURGICAL) ×1 IMPLANT
GLOVE BIO SURGEON STRL SZ8.5 (GLOVE) ×2 IMPLANT
GLOVE BIOGEL PI IND STRL 7.0 (GLOVE) ×3 IMPLANT
GLOVE BIOGEL PI IND STRL 8.5 (GLOVE) ×1 IMPLANT
GOWN STRL REUS W/TWL LRG LVL3 (GOWN DISPOSABLE) ×3 IMPLANT
GOWN STRL REUS W/TWL XL LVL3 (GOWN DISPOSABLE) ×1 IMPLANT
HEAD CERAMIC 36 PLUS5 (Hips) IMPLANT
HOOD W/PEELAWAY (MISCELLANEOUS) ×4 IMPLANT
KIT TURNOVER CYSTO (KITS) ×1 IMPLANT
LINER NEUTRAL 52X36MM PLUS 4 (Liner) IMPLANT
MANIFOLD NEPTUNE II (INSTRUMENTS) ×1 IMPLANT
MARKER SKIN DUAL TIP RULER LAB (MISCELLANEOUS) ×1 IMPLANT
NDL MAYO 1/2 CRC TROCAR PT (NEEDLE) IMPLANT
PACK TOTAL JOINT (CUSTOM PROCEDURE TRAY) ×1 IMPLANT
PAD ARMBOARD POSITIONER FOAM (MISCELLANEOUS) ×1 IMPLANT
PENCIL SMOKE EVACUATOR (MISCELLANEOUS) ×1 IMPLANT
PIN SECTOR W/GRIP ACE CUP 52MM (Hips) IMPLANT
SET BASIN LINEN APH (SET/KITS/TRAYS/PACK) ×1 IMPLANT
SET HNDPC FAN SPRY TIP SCT (DISPOSABLE) ×1 IMPLANT
SOL .9 NS 3000ML IRR UROMATIC (IV SOLUTION) ×1 IMPLANT
SOLN 0.9% NACL POUR BTL 1000ML (IV SOLUTION) ×1 IMPLANT
SOLN STERILE WATER BTL 1000 ML (IV SOLUTION) ×2 IMPLANT
STAPLER VISISTAT (STAPLE) ×1 IMPLANT
STEM FEMORAL SZ 5MM STD ACTIS (Stem) IMPLANT
SUT MNCRL AB 4-0 PS2 18 (SUTURE) IMPLANT
SUT MON AB 2-0 CT1 36 (SUTURE) ×2 IMPLANT
SUT STRATA PDS 0 30 CT-2.5 (SUTURE) IMPLANT
SUT VIC AB 1 CT1 27XBRD ANTBC (SUTURE) ×3 IMPLANT
TOWEL OR 17X26 4PK STRL BLUE (TOWEL DISPOSABLE) ×1 IMPLANT
TRAY FOLEY W/BAG SLVR 16FR ST (SET/KITS/TRAYS/PACK) ×1 IMPLANT
YANKAUER SUCT 12FT TUBE ARGYLE (SUCTIONS) ×1 IMPLANT

## 2024-03-10 NOTE — Anesthesia Postprocedure Evaluation (Signed)
"   Anesthesia Post Note  Patient: Troy Franklin  Procedure(s) Performed: ARTHROPLASTY, HIP, TOTAL, ANTERIOR APPROACH (Right: Hip)  Patient location during evaluation: PACU Anesthesia Type: Spinal Level of consciousness: oriented and awake and alert Pain management: pain level controlled Vital Signs Assessment: post-procedure vital signs reviewed and stable Respiratory status: spontaneous breathing, respiratory function stable and patient connected to nasal cannula oxygen Cardiovascular status: blood pressure returned to baseline and stable Postop Assessment: no headache, no backache and no apparent nausea or vomiting Anesthetic complications: no   No notable events documented.   Last Vitals:  Vitals:   03/10/24 0715 03/10/24 1047  BP: 128/87 100/62  Pulse:  65  Resp:  16  Temp:  37.3 C  SpO2: 98% 100%    Last Pain:  Vitals:   03/10/24 1047  TempSrc:   PainSc: Asleep                 Andrea Limes      "

## 2024-03-10 NOTE — Plan of Care (Signed)
?  Problem: Education: ?Goal: Knowledge of the prescribed therapeutic regimen will improve ?Outcome: Progressing ?  ?Problem: Education: ?Goal: Knowledge of the prescribed therapeutic regimen will improve ?Outcome: Progressing ?  ?

## 2024-03-10 NOTE — Transfer of Care (Signed)
 Immediate Anesthesia Transfer of Care Note  Patient: Troy Franklin  Procedure(s) Performed: ARTHROPLASTY, HIP, TOTAL, ANTERIOR APPROACH (Right: Hip)  Patient Location: PACU  Anesthesia Type:Spinal  Level of Consciousness: drowsy, patient cooperative, and responds to stimulation  Airway & Oxygen Therapy: Patient Spontanous Breathing  Post-op Assessment: Report given to RN and Post -op Vital signs reviewed and stable  Post vital signs: Reviewed and stable  Last Vitals:  Vitals Value Taken Time  BP 100/52   Temp 37.3 C 03/10/24 10:47  Pulse 65 03/10/24 10:48  Resp 16 03/10/24 10:48  SpO2 100 % 03/10/24 10:48  Vitals shown include unfiled device data.  Last Pain:  Vitals:   03/10/24 0628  TempSrc: Oral  PainSc: 1       Patients Stated Pain Goal: 5 (03/10/24 9371)  Complications: No notable events documented.

## 2024-03-10 NOTE — Anesthesia Procedure Notes (Signed)
 Spinal  Start time: 03/10/2024 7:18 AM End time: 03/10/2024 7:21 AM Reason for block: surgical anesthesia  Staffing Performed: resident/CRNA  Authorized by: Herschell Hollering, MD   Performed by: Pheobe Adine CROME, CRNA  Preanesthetic Checklist Completed: patient identified, IV checked, site marked, risks and benefits discussed, surgical consent, monitors and equipment checked, pre-op evaluation and timeout performed Spinal Block Patient position: sitting Prep: ChloraPrep Patient monitoring: heart rate, cardiac monitor, continuous pulse ox and blood pressure Approach: midline Location: L5-S1 Injection technique: single-shot Needle Needle type: Spinocan  Needle gauge: 22 G Needle length: 10 cm Assessment Sensory level: T4 Events: CSF return

## 2024-03-10 NOTE — Progress Notes (Signed)
 Wife Consuelo was brought to bedside in the PACU to see the patient because the patient is still waiting on a bed upstairs.  No other patients are expected in the PACU at this time.

## 2024-03-10 NOTE — Progress Notes (Signed)
 Called the [facility] to inquire about a bed for the patient.  She said the hospital is pretty full and they haven't forgotten about Troy Franklin.  But she states it may be another couple of hours before they have a bed available for him.   Patient arrived in the PACU at 1047 today.  Patient is doing well, has met PACU goals and is ready for transfer to an inpatient unit.  We are just waiting for one to become available.

## 2024-03-10 NOTE — Op Note (Signed)
 Orthopaedic Surgery Operative Note (CSN: [id number])  Toribio CROME Hadlock  [birth year] Date of Surgery: 03/10/2024   Diagnoses:  Right hip arthritis  Procedure: Right Total Hip Arthroplasty (CPT [phone])   Operative Finding Successful completion of the planned procedure.  Right total hip arthroplasty, using an anterior approach on a hand table.  Limb lengths were approximately equal.  Hip was stable, as confirmed under fluoroscopy.   Post-Op Diagnosis: Same Surgeons:Primary: Onesimo Oneil LABOR, MD Assistants: Montie Seltzer Location: [facility] OR ROOM 4 Anesthesia: Sedation plus regional anesthesia Antibiotics: Ancef  2 g with local vancomycin  powder 1 g at the surgical site Tourniquet time: N/A Estimated Blood Loss: 200 cc Complications: None Specimens: None  Implants: Implant Name Type Inv. Item Serial No. Manufacturer Lot No. LRB No. Used Action  PIN SECTOR W/GRIP ACE CUP - [id number] Hips PIN SECTOR W/GRIP ACE CUP  [employer]PU[employer] [id number] Right 1 Implanted  LINER NEUTRAL 52X36MM PLUS 4 - [id number] Liner LINER NEUTRAL [id number] PLUS 4  DEPUY ORTHOPA[employer] [id number] Right 1 Implanted  HEAD CERAMIC 36 PLUS5 - [id number] Hips HEAD CERAMIC 36 PLUS5  DEPUY ORTH[employer] [id number] Right 1 Implanted  STEM FEMORAL SZ STD ACTIS - [id number] Stem STEM FEMORAL SZ STD ACTIS  RICARDA RE[employer]ONS [id number] Right 1 Implanted    Indications for Surgery:   KOLTIN WEHMEYER is a 53 y.o. male who has advanced degenerative changes of the Right hip, with increases signal within the right femoral neck and head, consistent with some AVN, without subchondral collapse.  Patient has attempted multiple nonoperative measures.  Radiographs demonstrate advanced degenerative changes, including osteophytes, subchondral sclerosis, and associated cysts.  Nonoperative treatment including over-the-counter NSAIDs, prescription NSAIDs, physical therapy, hip injections and activity modifications have not provided sustained  relief.  As result, I have recommended proceeding with total hip arthroplasty.  Benefits and risks of operative and nonoperative management were discussed prior to surgery with the patient and informed consent form was completed.  Specific risks including infection, need for additional surgery, fracture, dislocation, persistent pain, damage to surrounding structures including nerves and blood vessels, poor integration of the implants, blood clots and more severe complications associated with anesthesia.  All questions have been answered.  They elected proceed with surgery.  Surgical consent was finalized.   Procedure:   The patient was identified properly. Informed consent was obtained and the surgical site was marked. The patient was taken to the OR where a spinal and sedation was induced.  The patient was positioned supine on a Hana table, with both feet in boots.  We confirmed appropriate position using fluoroscopy, prior to draping.  A Foley catheter was placed.  The right hip was prepped and draped in the usual sterile fashion.  Timeout was performed before the beginning of the case.  Patient received 2 g of Ancef  and 1 g of TXA prior to making incision.  The ASI was palpated, and marked.  We used this is our primary landmark.  We made a long contusional incision, just distal to the ASIS, extending distally over the anterior thigh.  We incised sharply through skin, then through subcutaneous tissue.  The fascia overlying the TFL was identified, and cleared.  Hemostasis was achieved.  We palpated the ASIS 1 more time, to confirm that we were indeed lateral, and in line with the TFL.  We then used a knife to incise sharply through the fascia across the extent of the incision.  We used pickups to develop a plane medially.  We  then introduced a retractor to retract the underlying muscle laterally.  We are able to palpate the femoral head, and a Cobra was placed directly over the superior aspect of the  femoral neck.  The crossing arteries were identified, cauterized.  We achieved hemostasis.  Fat overlying the capsule was removed using a rongeurs.  We then able to palpate the inferior aspect of the femoral neck.  An additional Cobra retractor was placed on the inferior aspect of the femoral neck.  We identified the most superior portion of the vastus, and planned out our capsulotomy.  We incised obliquely extending from the inferior aspect of the femoral neck laterally, across the femoral neck to towards the femoral head.  We then completed a horizontal incision at the most superior aspect of the vastus, towards the inferior femoral neck.  This was taken directly down to bone.  We then used a Cobb elevator, and replaced the superior cobra retractor intracapsular.  We then continued with Bovie cautery to release the medial portion of the hip capsule, and placed the inferior cobra intracapsular.  We continued to release the hip capsule superior and inferior so that we are able to palpate the lesser trochanter.  Both leaves of the capsule were then tagged for continued manipulation.  Superior saddle, as well as the lesser trochanter as guides, we planned out our femoral neck cut.  Retractors were then removed.  Fluoroscopy was then brought in, in order to confirm an appropriate femoral neck cut.  We completed the femoral neck cut using a saw inferiorly, and completed the cut using osteotome.  We then introduced a corkscrew within the cut portion of the femoral neck.  This was initially power, then transitioned to hand.  We manipulated the cut femoral neck and head until we are able to achieve full control.  Additional attachments of the capsule were released using Bovie cautery.  Subsequently, we were able to remove the femoral head.  The femoral head was measured on the back table, and determined to be 51 mm.    Next, we made plans to prepare the acetabulum.  A 90 degree bent Hohmann was placed over the anterior  rim of the acetabulum.  Using Bovie cautery, we released the transverse ligament inferiorly, as well as an in fold of the remaining capsule.  We then used pituitary rongeurs, to release any remaining labrum circumferentially.  Bleeding was controlled.  We then released the soft tissue within the acetabular fossa.  Self retainer was placed within the distal extent of the approach.  The femur was then rotated approximately 90 degrees to give greater access to the acetabulum.  We selected a 43 mm reamer to start.  We started by reaming primarily medial, until the excess bone was removed overlying the fossa.  We then continued to increase the size of the reamer, until we inserted a 51 millimeter reamer.  At this point, all retractors were removed.  Fluoroscopy was used to confirm our cup position, and to ensure that we have achieved adequate reaming.  We continued to ream until we had bottomed out.  We had achieved approximately 45 degrees of abduction, and approximately 15-20 degrees of anteversion.  This was confirmed under fluoroscopy.  The reamer was removed.  Retractors were replaced.  We had good access to the acetabulum.  The acetabulum was irrigated.  We confirmed that all soft tissue had been removed from within the acetabulum.  We selected a 52 mm cup, and this was inserted into  the acetabulum, with the screw holes facing superior.  Once we are within the acetabulum, retractors were removed.  Once again fluoroscopy was used to confirm appropriate positioning of the cup, with approximate 45 degrees of abduction, and 15-20 degrees of anteversion.  The cup was then impacted under direct fluoroscopic guidance.  It was secured.  As a result, I made the decision not to place any superior screws.  Once again, the acetabulum was irrigated.  We replaced the retractors.  We then selected the above stated poly, and this was impacted into the cup.  We then turned our attention to the femur.  The leg was externally  rotated approximately 120 degrees.  We placed a Mueller type retractor on the posterior aspect of the femoral neck cut.  Next, we placed the hook for the bed attachment underneath the femur, with the retractor extending laterally.  This was held in position manually at this point.  The superior leaf of the capsule was identified.  We then placed a Hohmann retractor between the capsule and the gluteus medius tendon, in order to protect the gluteus tendon.  We proceeded to release the remaining capsule down to the greater trochanter.  We then proceeded to release additional soft tissue, including some of the short external rotators of the posterior aspect of the greater trochanter.  We then released some of the soft tissue attached to the superior aspect of the greater trochanter.  A retractor was then placed underneath the greater trochanter, and held laterally.  The bed attachment was secured within the rotating arm, and the femur was lifted using the bed attachment.  Under direct visualization, the leg was then very carefully extended and a adducted to provide additional exposure of the femoral neck cut.  At this point, we are satisfied with our overall exposure.  We did use a hip retractor superiorly, in order to protect the skin, and the muscle belly further.  A box osteotome was introduced, to gain access to the femoral canal.  We then used a rasp as a canal finder.  We then sequentially reamed until we were able to achieve excellent fit of the femoral canal.  Between each subsequent broach, the canal was sounded to ensure that we do not breached the canal posterior or anterior.  We matched the native version.  We then trialed a neck, and femoral head.  Retractors were removed.  The leg was repositioned, and ultimately reduced.  We confirmed appropriate alignment and fit under fluoroscopy.  We confirmed that there was no posterior cortex breach by externally rotating the leg 90 degrees, and confirming under  fluoroscopy.  The femoral head was dislocated again, and retractors were replaced.  The trial implants were removed.  The femoral canal was irrigated copiously.  The above-stated stem was then opened, and inserted via hand, until we started to experience resistance.  The stem was then impacted within the femoral canal.  We achieved excellent fit.  There was good stability.  A ceramic ball was then placed, and impacted on the femoral stem.  Retractors were removed, and the hip was reduced once again.  Overall appearance of the hip was confirmed under fluoroscopy.  We are satisfied with our alignment and fit within the femoral canal.  Limb lengths were approximately equal, based on imaging.  The hip was irrigated using Pulsavac irrigation.  We have previously tagged the hip capsule, and the sutures were used to close the capsule.  Again we irrigated the wound copiously.  We then closed the fascia with a running 0 Vicryl.  Vancomycin  was placed within the surgical incision.  Skin was closed in a layered fashion with 2-0 Monocryl, and staples.  Incision was covered with Xeroform, and an Aquacel dressing.  Patient was awoken taken to PACU in stable condition.   Post-operative plan:  The patient will be WBAT on the operative extremity.  No restrictions. Patient will be admitted to the floor for overnight observation. Evaluation by PT/OT DVT prophylaxis Aspirin  81 mg twice daily for 6 weeks.    Pain control with PRN pain medication preferring oral medicines.   Follow up plan will be scheduled in approximately 10-14 days for incision check and XR.

## 2024-03-10 NOTE — Interval H&P Note (Signed)
 History and Physical Interval Note:  03/10/2024 7:17 AM  Troy Franklin  has presented today for surgery, with the diagnosis of Right hip arthritis.  The various methods of treatment have been discussed with the patient and family. After consideration of risks, benefits and other options for treatment, the patient has consented to  Procedures: ARTHROPLASTY, HIP, TOTAL, ANTERIOR APPROACH (Right) as a surgical intervention.  The patient's history has been reviewed, patient examined, no change in status, stable for surgery.  I have reviewed the patient's chart and labs.  Questions were answered to the patient's satisfaction.     [name]

## 2024-03-10 NOTE — Anesthesia Preprocedure Evaluation (Addendum)
"                                    Anesthesia Evaluation  Patient identified by MRN, date of birth, ID band Patient awake    Reviewed: Allergy & Precautions, H&P , NPO status , Patient's Chart, lab work & pertinent test results  History of Anesthesia Complications (+) PONV and history of anesthetic complications  Airway Mallampati: III  TM Distance: >3 FB Neck ROM: Full    Dental no notable dental hx.    Pulmonary sleep apnea , former smoker   Pulmonary exam normal breath sounds clear to auscultation       Cardiovascular hypertension, Normal cardiovascular exam Rhythm:Regular Rate:Normal     Neuro/Psych  Headaches  negative psych ROS   GI/Hepatic Neg liver ROS,GERD  ,,  Endo/Other  negative endocrine ROS    Renal/GU negative Renal ROS  negative genitourinary   Musculoskeletal  (+) Arthritis ,    Abdominal   Peds negative pediatric ROS (+)  Hematology negative hematology ROS (+)   Anesthesia Other Findings   Reproductive/Obstetrics negative OB ROS                              Anesthesia Physical Anesthesia Plan  ASA: 2  Anesthesia Plan: Spinal   Post-op Pain Management:    Induction:   PONV Risk Score and Plan: 1  Airway Management Planned: Nasal Cannula  Additional Equipment:   Intra-op Plan:   Post-operative Plan:   Informed Consent: I have reviewed the patients History and Physical, chart, labs and discussed the procedure including the risks, benefits and alternatives for the proposed anesthesia with the patient or authorized representative who has indicated his/her understanding and acceptance.       Plan Discussed with: CRNA and Surgeon  Anesthesia Plan Comments: (Patient has had multiple back surgeries with hardware in place  )         Anesthesia Quick Evaluation  "

## 2024-03-11 ENCOUNTER — Encounter (HOSPITAL_COMMUNITY): Payer: Self-pay | Admitting: Orthopedic Surgery

## 2024-03-11 DIAGNOSIS — M1611 Unilateral primary osteoarthritis, right hip: Secondary | ICD-10-CM | POA: Diagnosis not present

## 2024-03-11 LAB — GLUCOSE, CAPILLARY: Glucose-Capillary: 175 mg/dL — ABNORMAL HIGH (ref 70–99)

## 2024-03-11 MED ORDER — ONDANSETRON HCL 4 MG PO TABS: 4.0000 mg | ORAL_TABLET | Freq: Three times a day (TID) | ORAL | 0 refills | Status: AC | PRN

## 2024-03-11 MED ORDER — ACETAMINOPHEN 500 MG PO TABS: 1000.0000 mg | ORAL_TABLET | Freq: Three times a day (TID) | ORAL | 0 refills | Status: AC

## 2024-03-11 MED ORDER — ASPIRIN 81 MG PO TBEC: 81.0000 mg | DELAYED_RELEASE_TABLET | Freq: Two times a day (BID) | ORAL | 0 refills | Status: AC

## 2024-03-11 MED ORDER — CELECOXIB 100 MG PO CAPS: 100.0000 mg | ORAL_CAPSULE | Freq: Every day | ORAL | 0 refills | Status: AC

## 2024-03-11 MED ORDER — CYCLOBENZAPRINE HCL 10 MG PO TABS: 10.0000 mg | ORAL_TABLET | Freq: Three times a day (TID) | ORAL | 0 refills | Status: AC | PRN

## 2024-03-11 MED ORDER — OXYCODONE HCL 5 MG PO TABS: 5.0000 mg | ORAL_TABLET | ORAL | 0 refills | Status: AC | PRN

## 2024-03-11 NOTE — Discharge Summary (Signed)
 " Patient ID: Troy Franklin MRN: [id number] DOB/AGE: 55-Jan-1972 55 y.o.  Admit date: 03/10/2024 Discharge date: 03/11/2024  Admission Diagnoses: Right hip arthritis  Discharge Diagnoses:  Principal Problem:   Arthritis of right hip   Past Medical History:  Diagnosis Date   Arthritis    lumbar   Complication of anesthesia    difficulty waking up- sleepiness lasted longer than other times.    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  Tolerated procedure well.  Was kept for monitoring overnight for pain control and medical monitoring.  Patient was evaluated by PT and OT and deemed to be stable for DC the morning after surgery. Patient was instructed on specific activity restrictions and all questions were answered.  All medical equipment needed was coordinated for the patient.  They were stable for DC on POD#1.    Consults: None  Significant Diagnostic Studies: No additional pertinent studies  Treatments: Surgery  Discharge Exam:  Vitals:   03/11/24 0030 03/11/24 0440  BP: 128/75 124/72  Pulse: 79 89  Resp: 19 19  Temp: 98 F (36.7 C) 98.1 F (36.7 C)  SpO2: 95% 93%    Alert and oriented, no acute distress  Surgical dressing is clean, dry and intact Minimal swelling Sensation to the foot is intact 2+ DP pulse Active motion intact in the EHL/TA  CBC    Latest Ref Rng & Units 03/10/2024   11:03 AM 03/04/2024   11:35 AM 01/24/2023    8:08 AM  CBC  WBC 4.0 - 10.5 K/uL  6.6  5.7   Hemoglobin 13.0 - 17.0 g/dL 87.9  85.2  83.9   Hematocrit 39.0 - 52.0 % 35.3  42.7  48.1   Platelets 150 - 400 K/uL  191  171       Disposition:  Discharge disposition: 01-Home or Self Care        Allergies as of 03/11/2024       Reactions   Onion Other (See Comments)   Migraines        Medication List     STOP taking these medications    naproxen sodium 220 MG tablet Commonly known as: ALEVE       TAKE these medications    acetaminophen  500 MG tablet Commonly known as: TYLENOL  Take 2 tablets (1,000 mg total) by mouth every 8 (eight) hours for 14 days. What changed:  medication strength how much to take when to take this reasons to take this   amLODipine -olmesartan  5-20 MG tablet Commonly known as: AZOR  Take 1 tablet by mouth once daily   aspirin  EC 81 MG tablet Take 1 tablet (81 mg total) by mouth in the morning and at bedtime. Swallow whole.   atorvastatin  20 MG tablet Commonly known as: LIPITOR Take 1 tablet by mouth once daily   celecoxib  100 MG capsule Commonly known as: CeleBREX  Take 1 capsule (100 mg total) by mouth daily for 14 days.   cyclobenzaprine  10 MG tablet Commonly known as: FLEXERIL  Take 1 tablet (10 mg total) by mouth 3 (three) times daily as needed for muscle spasms.   metFORMIN  500 MG 24 hr  tablet Commonly known as: GLUCOPHAGE -XR Take 1 tablet (500 mg total) by mouth 2 (two) times daily with a meal.   ondansetron  4 MG tablet Commonly known as: Zofran  Take 1 tablet (4 mg total) by mouth every 8 (eight) hours as needed for up to 14 days for nausea or vomiting.   oxyCODONE  5 MG immediate release tablet Commonly known as: Roxicodone  Take 1 tablet (5 mg total) by mouth every 4 (four) hours as needed for up to 7 days.   phentermine  15 MG capsule Take 1 capsule by mouth in the morning            "

## 2024-03-11 NOTE — Progress Notes (Signed)
" °   03/11/24 1049  TOC Brief Assessment  Insurance and Status Reviewed  Patient has primary care physician Yes  Home environment has been reviewed From Home  Prior level of function: Independent  Prior/Current Home Services No current home services  Social Drivers of Health Review SDOH reviewed no interventions necessary  Readmission risk has been reviewed Yes  Transition of care needs no transition of care needs at this time   Inpatient Care Management (ICM) has reviewed patient and no other ICM needs have been identified at this time. We will continue to monitor patient advancement through interdisciplinary progression rounds. If new patient transition needs arise, please place a ICM consult.  "

## 2024-03-11 NOTE — Discharge Instructions (Signed)
" °  Yorel Redder A. Onesimo, MD MS [facility] [address] [place],  [place]  [address] Phone: [phone] Fax: [phone]    POST-OPERATIVE INSTRUCTIONS - TOTAL HIP REPLACEMENT    WOUND CARE You may leave the operative dressing in place until your follow-up appointment. KEEP THE INCISIONS CLEAN AND DRY. There may be a small amount of fluid/bleeding leaking at the surgical site. This is normal after surgery.  If it fills with liquid or blood please call us  immediately to change it for you.  SHOWERING: - You may shower on Post-Op Day #3.  - The dressing is water resistant but do not scrub it as it may start to peel up.   - Gently pat the area dry.  - Do not soak the hip in water. Do not go swimming in the pool or ocean until your sutures are removed. - KEEP THE INCISIONS CLEAN AND DRY.  EXERCISES You have no restrictions You can bear weight on your leg Recommend you use a walker to help provide support when walking We can initiate physical therapy if interested  REGIONAL ANESTHESIA (NERVE BLOCKS) The anesthesia team may have performed a nerve block for you if safe in the setting of your care.  This is a great tool used to minimize pain.  Typically the block may start wearing off overnight but the long acting medicine may last for 3-4 days.  The nerve block wearing off can be a challenging period but please utilize your as needed pain medications to try and manage this period.    POST-OP MEDICATIONS- Multimodal approach to pain control In general your pain will be controlled with a combination of substances.  Prescriptions unless otherwise discussed are electronically sent to your pharmacy.  This is a carefully made plan we use to minimize narcotic use.     Meloxicam/Ibuprofen/Celebrex  - Anti-inflammatory medication taken on a scheduled basis Acetaminophen  - Non-narcotic pain medicine taken on a scheduled basis  Oxycodone  - This is a strong narcotic, to be  used only on an as needed basis for pain. Aspirin  81mg  - This medicine is used to minimize the risk of blood clots after surgery. Zofran  -  take as needed for nausea  Meloxicam/Celebrex /ibuprofen - these are anti-inflammatory and pain relievers.  Do not take additional ibuprofen, naproxen or other NSAID while taking this medicine.   FOLLOW-UP If you develop a Fever (>101.5), Redness or Drainage from the surgical incision site, please call our office to arrange for an evaluation. Please call the office to schedule a follow-up appointment for a wound check, 10-14 days post-operatively.   Narcotic Management   Per [facility] clinic policy, our goal is ensure optimal postoperative pain control with a multimodal pain management strategy.   For all [facility] patients, our goal is to wean post-operative narcotic medications by 6 weeks post-operatively.   If this is not possible due to utilization of pain medication prior to surgery, your [facility] doctor will support your acute post-operative pain control for the first 6 weeks postoperatively, with a plan to transition you back to your primary pain team following that.   [facility] will work to ensure a therapist, occupational.   "

## 2024-03-11 NOTE — Progress Notes (Signed)
Patient discharged home with instructions given on medications and follow up visits,verbalized understanding .Prescriptions sent to Pharmacy of choice documented on AVS.IV discontinued,catheter intact. Accompanied by staff to an awaiting vehicle.

## 2024-03-11 NOTE — Evaluation (Signed)
 Physical Therapy Evaluation Patient Details Name: Troy Franklin MRN: [id number] DOB: 1971/01/21 Today's Date: 03/11/2024  History of Present Illness  Troy Franklin is a 53 year old male status post Right Total Hip Arthroplasty on 03/10/24  Clinical Impression  Patient demonstrates slow labored movement for sitting up at bedside requiring assistance for moving RLE due to pain, once seated able to use RW for completing sit to stand, standing over commode in bathroom and over sink to wash hands. Patient tolerated ambulating in room, hallway with fair/good return for right heel to toe stepping without loss of balance and limited mostly due to fatigue, mild nausea and right hip pain. Patient tolerated sitting up in chair after therapy. PLAN:  Patient to be discharged home today and discharged from acute physical therapy to care of nursing for ambulation as tolerated for length of stay with recommendations stated below           If plan is discharge home, recommend the following: A little help with walking and/or transfers;A little help with bathing/dressing/bathroom;Help with stairs or ramp for entrance;Assist for transportation;Assistance with cooking/housework   Can travel by private Tax Inspector (2 wheels)  Recommendations for Other Services       Functional Status Assessment Patient has had a recent decline in their functional status and demonstrates the ability to make significant improvements in function in a reasonable and predictable amount of time.     Precautions / Restrictions Precautions Precautions: Fall Recall of Precautions/Restrictions: Intact Restrictions Weight Bearing Restrictions Per Provider Order: Yes RLE Weight Bearing Per Provider Order: Weight bearing as tolerated      Mobility  Bed Mobility Overal bed mobility: Needs Assistance Bed Mobility: Supine to Sit     Supine to sit: HOB elevated, Mod assist      General bed mobility comments: increased time, labored movment with increased right hip pain during movement    Transfers Overall transfer level: Needs assistance Equipment used: Rolling walker (2 wheels) Transfers: Sit to/from Stand Sit to Stand: Contact guard assist           General transfer comment: labored movement with good return for standing over commode to urinate and sink for washing hands    Ambulation/Gait Ambulation/Gait assistance: Supervision, Contact guard assist Gait Distance (Feet): 75 Feet Assistive device: Rolling walker (2 wheels) Gait Pattern/deviations: Decreased step length - left, Decreased stance time - right, Decreased stride length, Antalgic Gait velocity: decr     General Gait Details: slow slightly labored movement with fair/good return for right heel to toe stepping without loss of balance, limited mostly due to fatigue  Stairs            Wheelchair Mobility     Tilt Bed    Modified Rankin (Stroke Patients Only)       Balance Overall balance assessment: Needs assistance Sitting-balance support: Feet supported, No upper extremity supported Sitting balance-Leahy Scale: Good Sitting balance - Comments: seated at EOB   Standing balance support: Bilateral upper extremity supported, During functional activity, Reliant on assistive device for balance Standing balance-Leahy Scale: Fair Standing balance comment: using RW                             Pertinent Vitals/Pain Pain Assessment Pain Assessment: 0-10 Pain Score: 2  Pain Location: R hip ; back Pain Descriptors / Indicators: Grimacing, Discomfort Pain Intervention(s): Limited activity within  patient's tolerance, Monitored during session, Repositioned    Home Living Family/patient expects to be discharged to:: Private residence Living Arrangements: Spouse/significant other Available Help at Discharge: Family;Available 24 hours/day Type of Home: House Home  Access: Stairs to enter Entrance Stairs-Rails: None Entrance Stairs-Number of Steps: 2   Home Layout: One level Home Equipment: Cane - single point;BSC/3in1;Shower seat - built in      Prior Function Prior Level of Function : Independent/Modified Independent;Driving             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     Extremity/Trunk Assessment   Upper Extremity Assessment Upper Extremity Assessment: Defer to OT evaluation    Lower Extremity Assessment Lower Extremity Assessment: Overall WFL for tasks assessed;RLE deficits/detail RLE Deficits / Details: grossly -4/5 RLE: Unable to fully assess due to pain RLE Sensation: WNL RLE Coordination: WNL    Cervical / Trunk Assessment Cervical / Trunk Assessment: Normal  Communication   Communication Communication: No apparent difficulties    Cognition Arousal: Alert Behavior During Therapy: WFL for tasks assessed/performed                             Following commands: Intact       Cueing Cueing Techniques: Verbal cues     General Comments      Exercises     Assessment/Plan    PT Assessment All further PT needs can be met in the next venue of care  PT Problem List Decreased strength;Decreased activity tolerance;Decreased balance;Decreased mobility;Pain       PT Treatment Interventions      PT Goals (Current goals can be found in the Care Plan section)  Acute Rehab PT Goals Patient Stated Goal: return home with family to assist PT Goal Formulation: With patient Time For Goal Achievement: 03/11/24 Potential to Achieve Goals: Good    Frequency       Co-evaluation PT/OT/SLP Co-Evaluation/Treatment: Yes Reason for Co-Treatment: To address functional/ADL transfers PT goals addressed during session: Mobility/safety with mobility;Balance;Proper use of DME OT goals addressed during session: ADL's and self-care       AM-PAC PT 6 Clicks  Mobility  Outcome Measure Help needed turning from your back to your side while in a flat bed without using bedrails?: A Little Help needed moving from lying on your back to sitting on the side of a flat bed without using bedrails?: A Lot Help needed moving to and from a bed to a chair (including a wheelchair)?: A Little Help needed standing up from a chair using your arms (e.g., wheelchair or bedside chair)?: A Little Help needed to walk in hospital room?: A Little Help needed climbing 3-5 steps with a railing? : A Little 6 Click Score: 17    End of Session   Activity Tolerance: Patient tolerated treatment well;Patient limited by fatigue;Patient limited by pain Patient left: in chair;with call bell/phone within reach Nurse Communication: Mobility status PT Visit Diagnosis: Unsteadiness on feet (R26.81);Other abnormalities of gait and mobility (R26.89);Muscle weakness (generalized) (M62.81)    Time: 9163-9098 PT Time Calculation (min) (ACUTE ONLY): 25 min   Charges:   PT Evaluation $PT Eval Moderate Complexity: 1 Mod PT Treatments $Therapeutic Activity: 23-37 mins PT General Charges $$ ACUTE PT VISIT: 1 Visit         11:27 AM, 03/11/2024 Lynwood Music, MPT Physical Therapist with [facility] [phone] office [phone] mobile phone

## 2024-03-11 NOTE — Plan of Care (Signed)
" °  Problem: Clinical Measurements: Goal: Postoperative complications will be avoided or minimized Outcome: Progressing   Problem: Respiratory: Goal: Will regain and/or maintain adequate ventilation Outcome: Progressing Goal: Respiratory status will improve Outcome: Progressing   Problem: Urinary Elimination: Goal: Will remain free from infection Outcome: Progressing Goal: Ability to achieve and maintain adequate urine output Outcome: Progressing   Problem: Health Behavior/Discharge Planning: Goal: Ability to manage health-related needs will improve Outcome: Progressing   Problem: Clinical Measurements: Goal: Will remain free from infection Outcome: Progressing   Problem: Activity: Goal: Risk for activity intolerance will decrease Outcome: Progressing   Problem: Elimination: Goal: Will not experience complications related to urinary retention Outcome: Progressing   Problem: Pain Managment: Goal: General experience of comfort will improve and/or be controlled Outcome: Progressing   Problem: Safety: Goal: Ability to remain free from injury will improve Outcome: Progressing   Problem: Skin Integrity: Goal: Risk for impaired skin integrity will decrease Outcome: Progressing   Problem: Skin Integrity: Goal: Risk for impaired skin integrity will decrease Outcome: Progressing   "

## 2024-03-11 NOTE — Evaluation (Signed)
 Occupational Therapy Evaluation Patient Details Name: Troy Franklin MRN: [id number] DOB: Sep 29, 1970 Today's Date: 03/11/2024   History of Present Illness   Troy Franklin is a 52 year old male status post Right Total Hip Arthroplasty.     Clinical Impressions Pt agreeable to OT and PT co-evaluation. Pt reports having wife at home for support. Pt required mod A for bed mobility and CGA for functional transfers. Pt reports no issues with B UE but will need support for lower body ADL's based on performance today. Pt left in the chair with call bell within reach. Pt will benefit from continued OT in the hospital to increase strength, balance, and endurance for safe ADL's.        If plan is discharge home, recommend the following:   A little help with walking and/or transfers;A lot of help with bathing/dressing/bathroom;Assistance with cooking/housework;Assist for transportation;Help with stairs or ramp for entrance     Functional Status Assessment   Patient has had a recent decline in their functional status and demonstrates the ability to make significant improvements in function in a reasonable and predictable amount of time.     Equipment Recommendations   None recommended by OT     Recommendations for Other Services         Precautions/Restrictions   Precautions Precautions: Fall Recall of Precautions/Restrictions: Intact Restrictions Weight Bearing Restrictions Per Provider Order: Yes RLE Weight Bearing Per Provider Order: Weight bearing as tolerated     Mobility Bed Mobility Overal bed mobility: Needs Assistance Bed Mobility: Supine to Sit     Supine to sit: HOB elevated, Mod assist     General bed mobility comments: Assist to move R LE to EOB and to pull to sit.    Transfers Overall transfer level: Needs assistance Equipment used: Rolling walker (2 wheels) Transfers: Sit to/from Stand Sit to Stand: Contact guard assist           General  transfer comment: Sit to stand from EOB with RW followed by ambulation to the toilet.      Balance Overall balance assessment: Needs assistance Sitting-balance support: No upper extremity supported, Feet supported Sitting balance-Leahy Scale: Good Sitting balance - Comments: seated at EOB   Standing balance support: Bilateral upper extremity supported, During functional activity, Reliant on assistive device for balance Standing balance-Leahy Scale: Fair Standing balance comment: using RW                           ADL either performed or assessed with clinical judgement   ADL Overall ADL's : Needs assistance/impaired     Grooming: Supervision/safety;Standing;Wash/dry hands   Upper Body Bathing: Set up;Modified independent;Sitting   Lower Body Bathing: Moderate assistance;Maximal assistance;Sitting/lateral leans   Upper Body Dressing : Modified independent;Set up;Sitting   Lower Body Dressing: Maximal assistance;Sitting/lateral leans   Toilet Transfer: Contact guard assist;Rolling walker (2 wheels);Ambulation Toilet Transfer Details (indicate cue type and reason): Ambulated to toilet with RW. Toileting- Clothing Manipulation and Hygiene: Contact guard assist;Sit to/from stand       Functional mobility during ADLs: Contact guard assist;Rolling walker (2 wheels) General ADL Comments: Ambulated in hall for over 100 feet with RW.     Vision Baseline Vision/History: 1 Wears glasses Ability to See in Adequate Light: 0 Adequate Patient Visual Report: No change from baseline Vision Assessment?: No apparent visual deficits;Wears glasses for reading     Perception Perception: Not tested       Praxis  Praxis: Not tested       Pertinent Vitals/Pain Pain Assessment Pain Assessment: 0-10 Pain Score: 2  Pain Location: R hip ; back Pain Descriptors / Indicators: Grimacing, Discomfort Pain Intervention(s): Monitored during session, Repositioned, Limited activity  within patient's tolerance     Extremity/Trunk Assessment Upper Extremity Assessment Upper Extremity Assessment: Overall WFL for tasks assessed   Lower Extremity Assessment Lower Extremity Assessment: Defer to PT evaluation   Cervical / Trunk Assessment Cervical / Trunk Assessment: Normal   Communication Communication Communication: No apparent difficulties   Cognition Arousal: Alert Behavior During Therapy: WFL for tasks assessed/performed Cognition: No apparent impairments                               Following commands: Intact       Cueing  General Comments   Cueing Techniques: Verbal cues                 Home Living Family/patient expects to be discharged to:: Private residence Living Arrangements: Spouse/significant other Available Help at Discharge: Family;Available 24 hours/day Type of Home: House Home Access: Stairs to enter [employer] of Steps: 2 Entrance Stairs-Rails: None Home Layout: One level     Bathroom Shower/Tub: Producer, Television/film/video: Handicapped height Bathroom Accessibility: Yes How Accessible: Accessible via wheelchair;Accessible via walker Home Equipment: Cane - single point;BSC/3in1;Shower seat - built in          Prior Functioning/Environment Prior Level of Function : Independent/Modified Independent             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                            Co-evaluation PT/OT/SLP Co-Evaluation/Treatment: Yes Reason for Co-Treatment: To address functional/ADL transfers   OT goals addressed during session: ADL's and self-care      AM-PAC OT 6 Clicks Daily Activity     Outcome Measure Help from another person eating meals?: None Help from another person taking care of personal grooming?: A Little Help from another person toileting, which includes using toliet, bedpan, or urinal?: A Little Help from  another person bathing (including washing, rinsing, drying)?: A Lot Help from another person to put on and taking off regular upper body clothing?: A Little Help from another person to put on and taking off regular lower body clothing?: A Lot 6 Click Score: 17   End of Session Equipment Utilized During Treatment: Rolling walker (2 wheels)  Activity Tolerance: Patient tolerated treatment well Patient left: in chair;with call bell/phone within reach  OT Visit Diagnosis: Unsteadiness on feet (R26.81);Other abnormalities of gait and mobility (R26.89)                Time: 9159-9096 OT Time Calculation (min): 23 min Charges:  OT General Charges $OT Visit: 1 Visit OT Evaluation $OT Eval Low Complexity: 1 Low  Cecil Bixby OT, MOT   Jayson Person 03/11/2024, 9:36 AM

## 2024-03-21 ENCOUNTER — Ambulatory Visit: Admitting: Orthopedic Surgery

## 2024-03-21 ENCOUNTER — Other Ambulatory Visit: Payer: Self-pay

## 2024-03-21 DIAGNOSIS — Z96641 Presence of right artificial hip joint: Secondary | ICD-10-CM | POA: Diagnosis not present

## 2024-03-23 ENCOUNTER — Encounter: Payer: Self-pay | Admitting: Orthopedic Surgery

## 2024-03-23 NOTE — Progress Notes (Signed)
 Orthopaedic Postop Note  Assessment: Troy Franklin is a 54 y.o. male s/p Right total hip arthroplasty, direct anterior approach  DOS: 03/10/24  Plan: Kurtis removed, steri strips placed Continue with DVT prophylaxis for at least 6 weeks after surgery WBAT on the operative extremity Follow up in 4 weeks; call with any issues  Follow-up: Return in about 4 weeks (around 04/18/2024). XR at next visit: AP pelvis and Right hip  Subjective:  Chief Complaint  Patient presents with   Routine Post Op    R hip DOS 03/10/24    History of Present Illness: Troy Franklin is a 54 y.o. male who presents following the above stated procedure.  Surgery was approximately 2 weeks ago.  He has done well.  He is no longer taking pain medications.  Occasional tylenol .  Walking with a cane.  No HH PT.  Review of Systems: No fevers or chills No numbness or tingling No Chest Pain No shortness of breath   Objective: There were no vitals taken for this visit.  Physical Exam:  Alert and oriented, no acute distress  Anterior hip incision is healing well, no surrounding erythema or drainage Ambulating with the assistance of a cane Sensation is intact to the dorsum of the foot Active motion intact to the EHL/TA  IMAGING: I personally ordered and reviewed the following images:  XR of the Right hip and AP pelvis demonstrates a total hip arthroplasty in good position.  The hip remains reduced.  There is no evidence of implant subsidence.  No acute fractures are noted.  Impression: Right hip arthroplasty in stable position, without evidence of migration or subsidence compared to prior XR   [name], MD 03/23/2024 12:20 PM

## 2024-03-28 ENCOUNTER — Ambulatory Visit: Admitting: Orthopedic Surgery

## 2024-04-07 ENCOUNTER — Other Ambulatory Visit: Payer: Self-pay

## 2024-04-07 DIAGNOSIS — R7303 Prediabetes: Secondary | ICD-10-CM

## 2024-04-07 DIAGNOSIS — E66812 Obesity, class 2: Secondary | ICD-10-CM

## 2024-04-18 ENCOUNTER — Encounter: Payer: Self-pay | Admitting: Orthopedic Surgery

## 2024-04-18 ENCOUNTER — Ambulatory Visit: Admitting: Orthopedic Surgery

## 2024-04-18 ENCOUNTER — Other Ambulatory Visit: Payer: Self-pay

## 2024-04-18 DIAGNOSIS — Z96641 Presence of right artificial hip joint: Secondary | ICD-10-CM

## 2024-05-30 ENCOUNTER — Encounter: Admitting: Orthopedic Surgery
